# Patient Record
Sex: Female | Born: 1960 | Race: White | Hispanic: No | Marital: Married | Smoking: Never smoker
Health system: Southern US, Community
[De-identification: ages and names within clinical notes are randomized; demographics above are authoritative.]

## PROBLEM LIST (undated history)

## (undated) VITALS — BP 124/80 | HR 93 | Temp 97.5°F | Resp 20 | Ht 64.0 in | Wt 276.0 lb

## (undated) DIAGNOSIS — Z8619 Personal history of other infectious and parasitic diseases: Secondary | ICD-10-CM

## (undated) DIAGNOSIS — Z9989 Dependence on other enabling machines and devices: Secondary | ICD-10-CM

## (undated) DIAGNOSIS — G4733 Obstructive sleep apnea (adult) (pediatric): Secondary | ICD-10-CM

## (undated) DIAGNOSIS — I890 Lymphedema, not elsewhere classified: Secondary | ICD-10-CM

## (undated) DIAGNOSIS — E785 Hyperlipidemia, unspecified: Secondary | ICD-10-CM

## (undated) DIAGNOSIS — M797 Fibromyalgia: Secondary | ICD-10-CM

## (undated) DIAGNOSIS — F329 Major depressive disorder, single episode, unspecified: Secondary | ICD-10-CM

## (undated) DIAGNOSIS — M25519 Pain in unspecified shoulder: Secondary | ICD-10-CM

## (undated) DIAGNOSIS — F32A Depression, unspecified: Secondary | ICD-10-CM

## (undated) DIAGNOSIS — R51 Headache: Secondary | ICD-10-CM

## (undated) DIAGNOSIS — E882 Lipomatosis, not elsewhere classified: Secondary | ICD-10-CM

## (undated) DIAGNOSIS — K219 Gastro-esophageal reflux disease without esophagitis: Secondary | ICD-10-CM

## (undated) DIAGNOSIS — E039 Hypothyroidism, unspecified: Secondary | ICD-10-CM

## (undated) DIAGNOSIS — J302 Other seasonal allergic rhinitis: Secondary | ICD-10-CM

## (undated) DIAGNOSIS — Z6841 Body Mass Index (BMI) 40.0 and over, adult: Secondary | ICD-10-CM

## (undated) DIAGNOSIS — G629 Polyneuropathy, unspecified: Secondary | ICD-10-CM

## (undated) DIAGNOSIS — I10 Essential (primary) hypertension: Secondary | ICD-10-CM

## (undated) DIAGNOSIS — Z9119 Patient's noncompliance with other medical treatment and regimen: Secondary | ICD-10-CM

## (undated) DIAGNOSIS — K279 Peptic ulcer, site unspecified, unspecified as acute or chronic, without hemorrhage or perforation: Secondary | ICD-10-CM

## (undated) DIAGNOSIS — S199XXA Unspecified injury of neck, initial encounter: Secondary | ICD-10-CM

## (undated) HISTORY — DX: Peptic ulcer, site unspecified, unspecified as acute or chronic, without hemorrhage or perforation: K27.9

## (undated) HISTORY — DX: Obstructive sleep apnea (adult) (pediatric): G47.33

## (undated) HISTORY — DX: Body Mass Index (BMI) 40.0 and over, adult: Z684

## (undated) HISTORY — DX: Major depressive disorder, single episode, unspecified: F32.9

## (undated) HISTORY — DX: Fibromyalgia: M79.7

## (undated) HISTORY — DX: Essential (primary) hypertension: I10

## (undated) HISTORY — DX: Hyperlipidemia, unspecified: E78.5

## (undated) HISTORY — DX: Lymphedema, not elsewhere classified: I89.0

## (undated) HISTORY — DX: Lipomatosis, not elsewhere classified: E88.2

## (undated) HISTORY — DX: Polyneuropathy, unspecified: G62.9

## (undated) HISTORY — DX: Patient's noncompliance with other medical treatment and regimen: Z91.19

## (undated) HISTORY — DX: Dependence on other enabling machines and devices: Z99.89

## (undated) HISTORY — DX: Hypothyroidism, unspecified: E03.9

## (undated) HISTORY — DX: Depression, unspecified: F32.A

## (undated) HISTORY — DX: Pain in unspecified shoulder: M25.519

## (undated) HISTORY — DX: Unspecified injury of neck, initial encounter: S19.9XXA

## (undated) HISTORY — DX: Other seasonal allergic rhinitis: J30.2

## (undated) HISTORY — DX: Personal history of other infectious and parasitic diseases: Z86.19

## (undated) HISTORY — PX: UPPER GASTROINTESTINAL ENDOSCOPY: SHX188

## (undated) HISTORY — DX: Morbid (severe) obesity due to excess calories: E66.01

## (undated) HISTORY — PX: COLONOSCOPY: SHX174

## (undated) HISTORY — DX: Headache: R51

---

## 1998-09-02 ENCOUNTER — Emergency Department (HOSPITAL_COMMUNITY): Admission: EM | Admit: 1998-09-02 | Discharge: 1998-09-02 | Payer: Self-pay | Admitting: Emergency Medicine

## 2000-01-21 ENCOUNTER — Other Ambulatory Visit: Admission: RE | Admit: 2000-01-21 | Discharge: 2000-01-22 | Payer: Self-pay | Admitting: Psychiatry

## 2000-06-23 ENCOUNTER — Ambulatory Visit (HOSPITAL_COMMUNITY): Admission: RE | Admit: 2000-06-23 | Discharge: 2000-06-23 | Payer: Self-pay | Admitting: Gastroenterology

## 2000-07-12 ENCOUNTER — Encounter: Payer: Self-pay | Admitting: Gastroenterology

## 2000-07-12 ENCOUNTER — Encounter: Admission: RE | Admit: 2000-07-12 | Discharge: 2000-07-12 | Payer: Self-pay | Admitting: Gastroenterology

## 2000-08-11 ENCOUNTER — Encounter: Admission: RE | Admit: 2000-08-11 | Discharge: 2000-08-11 | Payer: Self-pay | Admitting: Family Medicine

## 2000-08-11 ENCOUNTER — Encounter: Payer: Self-pay | Admitting: Family Medicine

## 2000-08-25 ENCOUNTER — Encounter: Admission: RE | Admit: 2000-08-25 | Discharge: 2000-08-25 | Payer: Self-pay | Admitting: Family Medicine

## 2000-08-25 ENCOUNTER — Encounter: Payer: Self-pay | Admitting: Family Medicine

## 2000-12-28 ENCOUNTER — Encounter: Admission: RE | Admit: 2000-12-28 | Discharge: 2000-12-28 | Payer: Self-pay | Admitting: Family Medicine

## 2000-12-28 ENCOUNTER — Encounter: Payer: Self-pay | Admitting: Family Medicine

## 2001-01-25 ENCOUNTER — Other Ambulatory Visit: Admission: RE | Admit: 2001-01-25 | Discharge: 2001-01-25 | Payer: Self-pay | Admitting: Internal Medicine

## 2001-03-09 ENCOUNTER — Encounter (INDEPENDENT_AMBULATORY_CARE_PROVIDER_SITE_OTHER): Payer: Self-pay | Admitting: *Deleted

## 2001-03-09 ENCOUNTER — Ambulatory Visit (HOSPITAL_COMMUNITY): Admission: RE | Admit: 2001-03-09 | Discharge: 2001-03-09 | Payer: Self-pay | Admitting: Gastroenterology

## 2001-05-06 ENCOUNTER — Encounter: Payer: Self-pay | Admitting: Internal Medicine

## 2001-05-06 ENCOUNTER — Encounter: Admission: RE | Admit: 2001-05-06 | Discharge: 2001-05-06 | Payer: Self-pay | Admitting: Internal Medicine

## 2001-06-20 ENCOUNTER — Encounter: Admission: RE | Admit: 2001-06-20 | Discharge: 2001-06-20 | Payer: Self-pay | Admitting: Internal Medicine

## 2001-06-20 ENCOUNTER — Encounter: Payer: Self-pay | Admitting: Internal Medicine

## 2001-11-23 HISTORY — PX: ABDOMINAL HYSTERECTOMY: SHX81

## 2001-11-23 HISTORY — PX: VAGINAL HYSTERECTOMY: SHX2639

## 2001-12-23 ENCOUNTER — Encounter: Admission: RE | Admit: 2001-12-23 | Discharge: 2002-03-23 | Payer: Self-pay | Admitting: Anesthesiology

## 2002-02-14 ENCOUNTER — Encounter: Payer: Self-pay | Admitting: Emergency Medicine

## 2002-02-14 ENCOUNTER — Emergency Department (HOSPITAL_COMMUNITY): Admission: EM | Admit: 2002-02-14 | Discharge: 2002-02-14 | Payer: Self-pay | Admitting: Emergency Medicine

## 2002-06-22 ENCOUNTER — Other Ambulatory Visit: Admission: RE | Admit: 2002-06-22 | Discharge: 2002-06-22 | Payer: Self-pay | Admitting: Obstetrics and Gynecology

## 2002-06-29 ENCOUNTER — Ambulatory Visit (HOSPITAL_COMMUNITY): Admission: RE | Admit: 2002-06-29 | Discharge: 2002-06-29 | Payer: Self-pay | Admitting: Obstetrics and Gynecology

## 2002-07-23 ENCOUNTER — Emergency Department (HOSPITAL_COMMUNITY): Admission: EM | Admit: 2002-07-23 | Discharge: 2002-07-23 | Payer: Self-pay | Admitting: *Deleted

## 2002-10-18 ENCOUNTER — Encounter (INDEPENDENT_AMBULATORY_CARE_PROVIDER_SITE_OTHER): Payer: Self-pay | Admitting: Specialist

## 2002-10-18 ENCOUNTER — Observation Stay (HOSPITAL_COMMUNITY): Admission: RE | Admit: 2002-10-18 | Discharge: 2002-10-19 | Payer: Self-pay | Admitting: Obstetrics and Gynecology

## 2005-01-13 ENCOUNTER — Encounter: Admission: RE | Admit: 2005-01-13 | Discharge: 2005-01-13 | Payer: Self-pay | Admitting: Cardiology

## 2005-01-15 ENCOUNTER — Ambulatory Visit (HOSPITAL_COMMUNITY): Admission: RE | Admit: 2005-01-15 | Discharge: 2005-01-15 | Payer: Self-pay | Admitting: Cardiology

## 2005-09-23 ENCOUNTER — Encounter: Admission: RE | Admit: 2005-09-23 | Discharge: 2005-09-23 | Payer: Self-pay | Admitting: Internal Medicine

## 2007-11-24 HISTORY — PX: RECTOCELE REPAIR: SHX761

## 2009-05-22 ENCOUNTER — Emergency Department: Payer: Self-pay | Admitting: Emergency Medicine

## 2009-10-30 ENCOUNTER — Ambulatory Visit: Payer: Self-pay | Admitting: Internal Medicine

## 2009-11-23 HISTORY — PX: VAGINA RECONSTRUCTION SURGERY: SHX828

## 2010-10-17 ENCOUNTER — Encounter: Admission: RE | Admit: 2010-10-17 | Discharge: 2010-10-17 | Payer: Self-pay | Admitting: Family Medicine

## 2010-12-13 ENCOUNTER — Encounter: Payer: Self-pay | Admitting: Internal Medicine

## 2011-04-10 NOTE — Op Note (Signed)
NAME:  Melinda Krause, Melinda Krause                    ACCOUNT NO.:  [id number]   MEDICAL RECORD NO.:  [id number]                   PATIENT TYPE:  AMB   LOCATION:  SDC                                  FACILITY:  [facility]   PHYSICIAN:  Crist Fat. Rivard, M.D.              DATE OF BIRTH:  12-18-1960   DATE OF PROCEDURE:  06/29/2002  DATE OF DISCHARGE:                                 OPERATIVE REPORT   PREOPERATIVE DIAGNOSES:  Chronic pelvic pain.   POSTOPERATIVE DIAGNOSES:  Probable adenomyosis, left peritubal cyst, right  ruptured ovarian cyst, corpus luteum, peritoneal window.   ANESTHESIA:  General.   PROCEDURE:  Diagnostic laparoscopy, ablation of left peritubal cyst,  cauterization of right ruptured ovarian cyst, uterosacral nerve ablation.   SURGEON:  Crist Fat. Rivard, M.D.   ESTIMATED BLOOD LOSS:  Minimal.   PROCEDURE:  After being informed of the planned procedure with possible  complications including bleeding, infection, injury to bowels, bladder, or  ureters, need for laparotomy, informed consent was obtained.  The patient  was taken to OR 4, given general anesthesia with endotracheal intubation.  She was prepped and draped in a sterile fashion and placed in a lithotomy  position.  Her bladder was emptied with an in-and-out Foley catheter.  GYN  examination reveals an anteverted uterus normal in shape and size, two  normal adnexa.  Speculum was inserted.  Anterior lip of the cervix was  grasped with a tenaculum forceps and an acorn intrauterine manipulator was  placed.  Subumbilical area was then infiltrated with Marcaine 0.25 and  incised in a semi-elliptical fashion with knife.  This was then bluntly  taken down to the fascia which was grasped with two Kocher forceps and  incised using Mayo scissors.  The peritoneum was entered in a blunt fashion.  No adhesions were felt around the umbilical area.  The Hasson trocar was  then inserted after placement of a Bertha suture of 0  Vicryl to hold the  trocar in place.  A suprapubic 5 mm incision was made in regards of the  previous midline incision after infiltration with Marcaine 0.25 and a 5 mm  trocar was inserted under direct visualization.  A left lower quadrant 5 mm  trocar was also inserted under direct visualization after infiltration with  Marcaine 0.25.   OBSERVATION:  Anterior cul-de-sac is normal other than the anterior  peritoneum close to the insertion of the round ligament showing a 1 cm  window.  Uterus is normal in size and shape, but has a soft consistency and  probe leaves indentation with manipulation all of which is compatible with  adenomyosis.  Both tubes show previous bilateral tubal ligation.  Right  ovary has a ruptured cyst which is actively bleeding, most likely on a  ruptured corpus luteum.  Left ovary is normal but left tube has a peritubal  cyst that has a bluish color to it and measures approximately 1  cm.  Posterior cul-de-sac is completely normal.  The appendix is visualized with  difficulty being retrocecal, but appears normal.  Liver edge is normal.  All  peritoneal surfaces are normal other than previously mentioned window.   We proceed initially with cauterization of the bleeding cyst using bipolar  cauterization.  The bleeding is rapidly stopped.  Hemostasis is adequate.  We then proceed with removal of the left peritubal cyst using cauterization  with bipolar and sharp scissors to excise.  The cyst is then ruptured and  removed via the 5 mm trocar and sent for pathology.  Hemostasis is completed  with cautery and is adequate.  We also proceed with cauterization of the  peritoneal window which is felt more to be secondary to scarring from  previous surgery than an active process of endometriosis.  Finally, we  proceed with a uterosacral nerve ablation, both uterosacral ligaments being  very easily identified with traction via the acorn manipulator.  Both  ureters are also  easily visualized during this procedure.  Both uterosacral  ligaments are then cauterized on a distance of 1.5 cm with bipolar and  sectioned with sharp scissors.  Hemostasis is adequate.  We then proceed  with abundant irrigation with warm saline and note a satisfactory  hemostasis.  The two 5 mm trocars are removed under direct visualization  with no evidence of bleeding.  We then remove the final trocar after  evacuation of pneumoperitoneum and close the fascia of the umbilical  incision using the previously placed 0 Vicryl suture ensuring that no bowel  loop is caught in the suture.  Skin of all three incisions is closed with  subcuticular suture of 3-0 Vicryl as well as Steri-Strips.  Instruments and  sponge count is complete x2.  Estimated blood loss is minimal.  Procedure is  very well tolerated by the patient who is taken to the recovery room in a  well and stable condition.                                               Crist Fat Rivard, M.D.    SAR/MEDQ  D:  06/29/2002  T:  07/01/2002  Job:  [id number]

## 2011-04-10 NOTE — H&P (Signed)
NAME:  Melinda Krause, Melinda Krause                    ACCOUNT NO.:  [id number]   MEDICAL RECORD NO.:  [id number]                   PATIENT TYPE:  AMB   LOCATION:  SDC                                  [facility]ILITY[facility]   PHYSICIAN:  Crist Fat. Rivard, M.D.              DATE OF BIRTH:  [birth year]   DATE OF ADMISSION:  10/18/2002  DATE OF DISCHARGE:                                HISTORY & PHYSICAL   TE OF PROCEDURE:  October 18, 2002.   REASON FOR ADMISSION:  Chronic pelvic pain.   HISTORY OF PRESENT ILLNESS:  The patient is a 50 year old married white  female gravida 3, para 3, aborta 0 followed by me since May 2002 for chronic  pelvic pain.  She reports menstrual cycle every 30 days lasting 5-6 days  with heavy flow the first few days, no change in her menstrual cycle  recently but she reports pelvic pressure with low back pain that is  constant, worse when starting ovulation and gets relief with her period.  The maximum intensity of her pain is 6-7/10 and it is associated with  nausea, decreased with appetite, but no vomiting.  She denies any  breakthrough bleeding, denies any dysmenorrhea or dyspareunia as well as  denies any postcoital bleeding.  This problem has been going on since the  Fall of 2001 and she has had a full GI workup which was negative other than  gastric ulcerations.   A diagnostic laparoscopy was performed June 29, 2002 which revealed a  peritoneal window close to the round ligament, a somewhat increased uterine  size with a consistency compatible with adenomyosis.  A left paratubal cyst  was removed during that procedure.  The remainder of the abdominopelvic  evaluation was negative at that time.   The patient is at this time requesting definitive treatment with  hysterectomy to most likely address her chronic pelvic pain.  She is fully  aware that the etiology of her pain is still not completely cleared and that  this procedure may not bring 100% relief.   PAST  MEDICAL HISTORY:  1. Allergies to DEMEROL.  2. Hypothyroidism.  3. Hypertension.  4. Status post bilateral tubal ligation.  5. Calcified herniated disk at T8.  6. L5-S1 hernia.  7. Peptic ulcers.  8. Two spontaneous vaginal deliveries and one cesarean section.   CURRENT MEDICATIONS:  1. Vioxx.  2. Imodium.  3. Effexor XR 150 mg q.d.  4. Bextra p.r.n.  5. Levothroid 0.025 mg q.d.  6. Atenolol 100 mg q.d.  7. Trazodone 100 mg h.s.   SOCIAL HISTORY:  Married, nonsmoker, has been out on disability due to her  chronic pelvic, abdominal and back pain, is a Designer, jewellery.   REVIEW OF SYSTEMS:  CONSTITUTIONAL: Negative.  HEAD/EYES/EARS/NOSE AND  THROAT: Negative.  LUNGS: Negative.  CARDIOVASCULAR: Negative.  GASTROINTESTINAL: Known for past peptic ulcers.  GENITOURINARY: Negative.  PSYCHIATRIC: Anxiety disorder followed by Dr. Jeanie Sewer.  FAMILY HISTORY:  Positive fibroids in mother and sister.  Mother with breast  cancer at age 29.  Maternal grandmother with breast cancer at age 24.  No  colon cancer.   PHYSICAL EXAMINATION:  VITAL SIGNS:  Current weight is 247 pounds for a  height of 5 feet 4 inches, blood pressure 124/78.  HEAD/EYES/EARS/NOSE AND THROAT:  Negative.  Thyroid not enlarged.  HEART:  Regular rate and rhythm.  CHEST:  Clear.  BREASTS:  No mass, discharge, skin changes, or nipple retraction.  BACK:  No CVA tenderness.  ABDOMEN:  No tenderness, masses, or hepatosplenomegaly.  EXTREMITIES:  Negative.  NEUROLOGICAL:  Within normal limits.  PELVIC:  Normal external genitalia, vagina and cervix.  Uterus is  anteverted, normal in size and shape.  Adnexa are normal.   LABORATORY DATA:  Most recent labs show a normal Pap smear June 22, 2002 as  well as a benign left paratubal cyst removed June 29, 2002.   ASSESSMENT:  Chronic pelvic pain with no improvement despite medical  treatment (has tried Yasmin for 3 months).  The patient has had evaluation  with  neurosurgeon which concluded to minimal disk disease.  The patient  requests to proceed with definitive therapy.   PLAN:  The patient will be admitted to undergo laparoscopy assisted vaginal  hysterectomy.  The procedure has been reviewed with the patient as well as  possible complications including bleeding, infection, injury to bowels,  bladder, or ureters, the need for laparotomy as well as earlier menopause.  Informed consent was obtained.                                               Crist Fat Rivard, M.D.    SAR/MEDQ  D:  10/17/2002  T:  10/17/2002  Job:  [id number]

## 2011-04-10 NOTE — Cardiovascular Report (Signed)
NAMEMarland Kitchen  MARYLYNNE, KEELIN NO.:  [id number]   MEDICAL RECORD NO.:  [id number]          PATIENT TYPE:  OIB   LOCATION:  2899                         FACILITY:  [facility]   PHYSICIAN:  Madaline Savage, M.D.DATE OF BIRTH:  [birth year]   DATE OF PROCEDURE:  01/15/2005  DATE OF DISCHARGE:                              CARDIAC CATHETERIZATION   Dr. Madaline Savage dictating the cardiac catheterization.   REPORT:   PROCEDURES PERFORMED:  1.  Selective coronary angiography by Judkins technique.  2.  Retrograde left heart catheterization.  3.  Left ventricular angiography.   CARDIOLOGIST:  Madaline Savage, M.D.   COMPLICATIONS:  None.   ENTRY SITE:  Right femoral.   DYE USED:  Omnipaque.   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 Dorothyann Peng who has recently had chest pain and also had a  abnormal electrocardiogram showing some nonspecific T-wave abnormalities in  the anteroseptal leads that did raise concern about ischemia.  A Cardiolite  stress test was performed on one 01/07/2005 showing evidence of ischemia to  the anterior wall.  The ejection fraction estimate was 72%.  Today, the  patient presented for outpatient cardiac cath which was accomplished without  complications.   RESULTS:  Pressures:  The left ventricular pressure was 130/90, end-  diastolic pressure 20. Central aortic pressure was 130/85, mean of 105.  No  aortic valve gradient by pullback technique.   ANGIOGRAPHIC RESULTS:  The patient's coronary arteries were all normal.  No  significant lesions were seen throughout.  The left ventricle showed normal  contractility with no wall motion abnormalities and no evidence of mitral  regurgitation.   FINAL DIAGNOSIS:  1.  Recent chest pain with mild nonspecific abnormalities in the ST-T      segments anterior leads.  2.  Angiographic patent coronary arteries.  3.  Normal left ventricular systolic  function.   PLAN:  The patient will be discharged after a 4-hour period of observation  and will follow with the excellent care of Dr. Dorothyann Peng.      WHG/MEDQ  D:  01/15/2005  T:  01/15/2005  Job:  [id number]   cc:   Candyce Churn. Allyne Gee, M.D.  [address]  [address]  [place]  [place] [address]  Fax: [phone]   [facility]

## 2011-04-10 NOTE — Discharge Summary (Signed)
   NAME:  Melinda Krause, Melinda Krause                    ACCOUNT NO.:  [id number]   MEDICAL RECORD NO.:  [id number]                   PATIENT TYPE:   LOCATION:                                       [facility]   PHYSICIAN:  Crist Fat. Rivard, M.D.              DATE OF BIRTH:  Mar 03, 1961   DATE OF ADMISSION:  10/18/2002  DATE OF DISCHARGE:  10/19/2002                                 DISCHARGE SUMMARY   DISCHARGE DIAGNOSES:  1. Chronic pelvic pain.  2. Fibroid uterus.   OPERATION:  On the date of admission patient underwent a laparoscopically  assisted vaginal hysterectomy, tolerating procedure well.  The patient was  found to have a uterus which was upper limits of normal size and boggy with  normal appearing tubes and ovaries bilaterally.   HISTORY OF PRESENT ILLNESS:  The patient is a 50 year old married white  female gravida 3, para 3, aborta 0 who presents for hysterectomy due to  pelvic pain of over one year duration.  Please see patient's dictated  history and physical examination for details.   PHYSICAL EXAMINATION:  VITAL SIGNS:  Blood pressure 124/78, weight 247  pounds, height 5 feet 4 inches tall.  GENERAL:  Within normal limits.  PELVIC:  Normal external genitalia.  Vagina and cervix are normal.  Uterus  is anteverted, normal in size and shape.  Adnexa are normal.   HOSPITAL COURSE:  On the day of admission patient underwent aforementioned  procedure, tolerating it well.  Postoperative hemoglobin was 10.0  (preoperative hemoglobin 14.5).  By postoperative day number one patient had  resumed bowel and bladder function and was deemed ready for discharge home.   DISCHARGE MEDICATIONS:  1. Tylox one to two tablets q.4h. as needed for pain.  2. Phenergan 25 mg one tablet q.6h. as needed for nausea.  3. Iron 325 mg one tablet b.i.d.  4. Stool softeners one tablet daily.   FOLLOW UP:  The patient is scheduled for a two week follow-up examination  with Dr. Dois Davenport Rivard.   DISCHARGE INSTRUCTIONS:  The patient was advised to call for any problems or  concerns, to clean her incision b.i.d. with peroxide and saline.  She was  further advised to avoid driving for two weeks, heavy lifting for four  weeks, and intercourse for six weeks.  Her diet is a normal diet.   FINAL PATHOLOGY:  Uterus, hysterectomy:  Benign cervix with mild chronic  cervicitis, benign secretory phase endometrium, and leiomyoma.    Melinda Krause.                    Crist Fat Rivard, M.D.   EJP/MEDQ  D:  11/14/2002  T:  11/14/2002  Job:  [id number]

## 2011-04-10 NOTE — Op Note (Signed)
NAME:  Melinda Krause, Melinda Krause                    ACCOUNT NO.:  [id number]   MEDICAL RECORD NO.:  [id number]                   PATIENT TYPE:  OBV   LOCATION:  9199                                 [facility]ACILITY[facility]   PHYSICIAN:  Crist Fat. Rivard, M.D.              DATE OF BIRTH:  1961/06/07   DATE OF PROCEDURE:  10/18/2002  DATE OF DISCHARGE:                                 OPERATIVE REPORT   PREOPERATIVE DIAGNOSES:  Chronic pelvic pain with possible adenomyosis.   POSTOPERATIVE DIAGNOSES:  Chronic pelvic pain with possible adenomyosis.   ANESTHESIA:  General with endotracheal intubation.   PROCEDURE:  Laparoscopically assisted vaginal hysterectomy.   SURGEON:  Crist Fat. Rivard, M.D.   ASSISTANTMarquis Lunch. Adline Peals.   ESTIMATED BLOOD LOSS:  100 cc.   PROCEDURE:  After being informed of the planned procedure with possible  complications including bleeding, infection, injury to bowel, bladder, or  ureters, and need for laparotomy as well as nonguarantee of completely  alleviating her pain, informed consent is obtained.  The patient is taken to  OR number four and given general anesthesia with endotracheal intubation.  She is placed in a lithotomy position, prepped and draped in a sterile  fashion and a Foley catheter is inserted in her bladder as well as a Hulka  intrauterine manipulator is in place.  We proceed with infiltration of the  umbilical area using 6 cc of Marcaine 0.25 and we remove the previously  present scar tissue.  Veress needle is inserted but insufflation is very  difficult, not being able to maintain insufflation due to increased  peritoneal pressure.  The Veress needle was then removed and we decided to  proceed with an open laparoscopy.  The fat and subcutaneous tissue is  brought down bluntly to the fascia which is grasped with two Kocher forceps.  It is then incised with Mayo scissors and a pursestring suture of 0 Vicryl  is placed around the fascia.  The  peritoneum is entered bluntly with no  trauma to any organ.  Hasson trocar is inserted and held in place with the  previously placed suture.  We can now proceed with full insufflation of the  peritoneal cavity using CO2 at a maximum pressure of 15 mmHg.  The  laparoscope is then inserted and mounted on video monitor.  Observation:  Anterior cul-de-sac is normal.  Posterior cul-de-sac has remnant of a  peritoneal defect close to the right uterosacral ligament as previously  mentioned in the diagnostic laparoscopy of August 2003.  Both tubes show  signs of tubal ligation.  Both ovaries are normal and the uterus still has  that soft consistency compatible with adenomyosis.  We are unable to  visualize the appendix and we are unable to visualize the liver edge due to  a somewhat prominent omentum and pericolic fat.  A right and left lower  quadrant trocar 5 mm are placed under direct  visualization after  infiltration of 2 cc of Marcaine 0.25.  We then proceed using tripolar with  cauterization of round ligament on both sides in section, cauterization of  utero-ovarian ligament on both sides in section, cauterization of tube on  both sides in section and this is brought down to about 1 cm above the  uterine arteries.  We are able to sharply open the broad ligament using  Metzenbaum scissors and to bluntly dissect the bladder down without  difficulty.  We then irrigate profusely with warm saline and note a  satisfactory hemostasis and move on to proceed with the vaginal tying.  The  cervix is grasped with two Christella Hartigan' forceps, the Hulka manipulator having  been removed.  The vaginal mucosa around the cervix is infiltrated with  lidocaine 1% with epinephrine 15 cc and is then incised in a circumferential  manner with knife.  This allows [place] to bluntly dissect the vaginal mucosa up  and down and to bluntly open the posterior cul-de-sac.  We are also able to  bluntly open the anterior cul-de-sac,  retracting the bladder up.  Both  uterosacral ligaments are then clamped with a Aundria Rud' clamp, sectioned, and  sutured with a transfixed suture of 0 Vicryl kept for future suspension.  Uterine arteries are then clamped on both sides using Aundria Rud' clamps,  sectioned, and sutured with 0 Vicryl and the uterus is removed entirely.  We  note at this moment a bleeding site on the right uterosacral ligament which  requires two figure-of-eight stitches of 0 Vicryl to complete the  hemostasis.  Hemostasis is then felt to be adequate.  We proceed with a  running locked suture of 0 Vicryl from one uterosacral ligament to the other  encompassing the posterior vaginal lip.  We also place a McCall Moskowitz  stitch from the uterosacral ligament, posterior peritoneum, uterosacral  ligament to close the posterior cul-de-sac and prevent future enterocele.  Hemostasis is rechecked and adequate.  We then close the vagina medially  using figure-of-eight stitches of 0 Vicryl.  We return in laparoscopy,  reinsufflate the pneumoperitoneum at maximum pressure of 15 mmHg and  systematically check hemostasis from one ovary to the other.  Hemostasis is  completely adequate.  We irrigate with warm saline profusely, still maintain  an adequate hemostasis.  We are able to visualize both ureters which have a  nice mobility.  The two 5 mm trocars are removed under direct visualization.  Pneumoperitoneum is evacuated and the Hasson trocar is then removed.  The  previously placed pursestring suture on the fascia is closed after verifying  that no bowel loop was present and the fascia is closed.  All incisions are  then closed with Dermabond.   Instrument and sponge count is complete x2.  Estimated blood loss is 100 cc.  The procedure is very well tolerated by the patient who is taken to the  recovery room in a well and stable condition.                                               Crist Fat Rivard, M.D.   SAR/MEDQ   D:  10/18/2002  T:  10/18/2002  Job:  [id number]

## 2011-04-10 NOTE — Procedures (Signed)
[facility]  Patient:    Melinda Krause, Melinda Krause                    MRN: [id number] Proc. Date: 03/09/01 Adm. Date:  [id number] Attending:  Charna Elizabeth CC:         Gloriajean Dell. Andrey Campanile, M.D.                           Procedure Report  DATE OF BIRTH:  REFERRING PHYSICIAN:  Gloriajean Dell. Andrey Campanile, M.D.  PROCEDURE PERFORMED:  Esophagogastroduodenoscopy with biopsies.  ENDOSCOPIST:  Anselmo Rod, M.D.  INSTRUMENT USED:  Olympus video panendoscope.  INDICATIONS FOR PROCEDURE:  Severe epigastric and left upper quadrant pain in a 50 year old white female rule out peptic ulcer disease, esophagitis, gastritis, etc.  PREPROCEDURE PREPARATION:  Informed consent was procured from the patient. The patient was fasted for eight hours prior to the procedure.  PREPROCEDURE PHYSICAL:  The patient had stable vital signs.  Neck supple. Chest clear to auscultation.  S1, S2 regular.  Abdomen soft with epigastric and left upper quadrant tenderness on palpation.  DESCRIPTION OF PROCEDURE:  The patient was placed in left lateral decubitus position and sedated with 100 mcg of fentanyl and 10 mg of Versed intravenously.  Once the patient was adequately sedated and maintained on low-flow oxygen and continuous cardiac monitoring, the Olympus video panendoscope was advanced through the mouthpiece, over the tongue, into the esophagus under direct vision.  The entire esophagus appeared normal without evidence of ring, stricture, masses, lesions, esophagitis or Barretts mucosa. The scope was then advanced to the stomach.  There were multiple midbody and antral ulcers.  There was a larger ulcer measuring about 1 cm in the antrum. Smaller ulcers were seen in the midbody.  Multiple biopsies were done to rule out presence of Helicobacter pylori by pathology.  The proximal small bowel. appeared normal.  The patient tolerated the procedure well without complications.  IMPRESSION: 1.  Multiple gastric ulcers, biopsies done for  Helicobacter pylori. 2. Normal-appearing esophagus and proximal small bowel.  RECOMMENDATION: 1. Nexium started for the patient 40 mg 1 p.o. q.d. 2. Treat with antibiotics if Helicobacter pylori present on biopsies. 3. Avoid all nonsteroidals.  Use Tylenol only as needed for pain. 4. Outpatient follow-up in the next two weeks. DD:  03/09/01 TD:  03/10/01 Job: [id number]

## 2011-07-17 ENCOUNTER — Ambulatory Visit (INDEPENDENT_AMBULATORY_CARE_PROVIDER_SITE_OTHER): Payer: BC Managed Care – PPO | Admitting: Internal Medicine

## 2011-07-17 ENCOUNTER — Encounter: Payer: Self-pay | Admitting: Internal Medicine

## 2011-07-17 VITALS — BP 132/90 | HR 82 | Temp 98.1°F | Resp 20 | Ht 64.0 in | Wt 240.0 lb

## 2011-07-17 DIAGNOSIS — R109 Unspecified abdominal pain: Secondary | ICD-10-CM | POA: Insufficient documentation

## 2011-07-17 DIAGNOSIS — K7689 Other specified diseases of liver: Secondary | ICD-10-CM

## 2011-07-17 DIAGNOSIS — K760 Fatty (change of) liver, not elsewhere classified: Secondary | ICD-10-CM | POA: Insufficient documentation

## 2011-07-17 DIAGNOSIS — G4700 Insomnia, unspecified: Secondary | ICD-10-CM

## 2011-07-17 DIAGNOSIS — G43909 Migraine, unspecified, not intractable, without status migrainosus: Secondary | ICD-10-CM | POA: Insufficient documentation

## 2011-07-17 DIAGNOSIS — R7309 Other abnormal glucose: Secondary | ICD-10-CM

## 2011-07-17 DIAGNOSIS — M797 Fibromyalgia: Secondary | ICD-10-CM

## 2011-07-17 DIAGNOSIS — R739 Hyperglycemia, unspecified: Secondary | ICD-10-CM | POA: Insufficient documentation

## 2011-07-17 DIAGNOSIS — F32A Depression, unspecified: Secondary | ICD-10-CM

## 2011-07-17 DIAGNOSIS — E039 Hypothyroidism, unspecified: Secondary | ICD-10-CM

## 2011-07-17 DIAGNOSIS — R634 Abnormal weight loss: Secondary | ICD-10-CM

## 2011-07-17 DIAGNOSIS — F3289 Other specified depressive episodes: Secondary | ICD-10-CM

## 2011-07-17 DIAGNOSIS — E785 Hyperlipidemia, unspecified: Secondary | ICD-10-CM

## 2011-07-17 DIAGNOSIS — IMO0001 Reserved for inherently not codable concepts without codable children: Secondary | ICD-10-CM

## 2011-07-17 DIAGNOSIS — F329 Major depressive disorder, single episode, unspecified: Secondary | ICD-10-CM

## 2011-07-17 DIAGNOSIS — I10 Essential (primary) hypertension: Secondary | ICD-10-CM

## 2011-07-17 LAB — TSH: TSH: 1.303 u[IU]/mL (ref 0.350–4.500)

## 2011-07-17 LAB — T4, FREE: Free T4: 1.79 ng/dL (ref 0.80–1.80)

## 2011-07-17 MED ORDER — ZOLPIDEM TARTRATE 10 MG PO TABS: 10.0000 mg | ORAL_TABLET | Freq: Every evening | ORAL | Status: DC | PRN

## 2011-07-17 MED ORDER — LEVOTHYROXINE SODIUM 200 MCG PO TABS: 200.0000 ug | ORAL_TABLET | Freq: Every day | ORAL | Status: DC

## 2011-07-17 NOTE — Assessment & Plan Note (Signed)
Continue psychiatry close followup

## 2011-07-17 NOTE — Progress Notes (Signed)
  Subjective:    Patient ID: Melinda Krause, female    DOB: 07-23-1961, 50 y.o.   MRN: [id number]  HPI Pt presents to clinic to establish care and for evaluation of multiple medical problems. Has chronic h/o unintended wt loss with associated intermittent n/v and luq pain. Duration several days. Failed ppi. Recalls recent abd [place] reportedly nl. Being evaluated by gi with egd and colonoscopy recommended. Known past h/o pud. Sees psychiatry for depression but also they have attempted multiple medications for insomnia without success. H/o hypothyroidism pt states has been labile requiring multiple dose changes. Takes medication on empty stomach away from other medications. Concerned for possible cushings and believes has had elevated cortisol in the past. Did see local endocrinologist who performed 24 hour urine cortisol reportedly nl. Has been told in past a1c elevated but does not appear to have met diagnostic criteria for DM.   Reviewed pmh, psh, medications, allergies, soc hx and fam hx.    Review of Systems  Constitutional: Positive for fatigue and unexpected weight change.  Gastrointestinal: Positive for nausea, vomiting, abdominal pain and diarrhea.  Psychiatric/Behavioral: Positive for sleep disturbance and dysphoric mood.  All other systems reviewed and are negative.       Objective:   Physical Exam  Physical Exam  Nursing note and vitals reviewed. Constitutional: Appears well-developed and well-nourished. No distress.  HENT: perrl, eom grossly intact, op clear Head: Normocephalic and atraumatic.  Right Ear: External ear normal. Nl tm and canal Left Ear: External ear normal. Nl tm and canal Eyes: Conjunctivae are normal. No scleral icterus.  Neck: Neck supple. Carotid bruit is not present.  Cardiovascular: Normal rate, regular rhythm and normal heart sounds.  Exam reveals no gallop and no friction rub.   No murmur heard. Pulmonary/Chest: Effort normal and breath sounds  normal. No respiratory distress. He has no wheezes. no rales.  Abd: soft, nd, mild discomfort to palpation left abdomen without rebound, guarding or rigidity. +bs. No masses or organomegaly. Lymphadenopathy:    He has no cervical adenopathy.  Neurological:Alert.  Skin: Skin is warm and dry. Not diaphoretic.  Psychiatric: Has a normal mood and affect.        Assessment & Plan:

## 2011-07-17 NOTE — Assessment & Plan Note (Signed)
Record release for recent labs. Recommend proceeding with egd/colon per gi

## 2011-07-17 NOTE — Assessment & Plan Note (Signed)
Short term ambien prn. Husband to assist in managing medication.

## 2011-07-17 NOTE — Assessment & Plan Note (Signed)
Obtain tsh/free t4. Requests [facility] endocrine consult given concern over possible cushings or other endocrinology disorder.

## 2011-08-20 ENCOUNTER — Ambulatory Visit: Payer: BC Managed Care – PPO | Admitting: Internal Medicine

## 2011-08-25 ENCOUNTER — Ambulatory Visit: Payer: BC Managed Care – PPO | Admitting: Internal Medicine

## 2011-09-04 ENCOUNTER — Telehealth: Payer: Self-pay | Admitting: Internal Medicine

## 2011-09-04 ENCOUNTER — Other Ambulatory Visit: Payer: Self-pay | Admitting: *Deleted

## 2011-09-04 MED ORDER — ZOLPIDEM TARTRATE 10 MG PO TABS: 10.0000 mg | ORAL_TABLET | Freq: Every evening | ORAL | Status: DC | PRN

## 2011-09-04 NOTE — Telephone Encounter (Signed)
Sheria Lang with [facility] [address] called and left voice message requesting refill on generic Ambien.

## 2011-09-04 NOTE — Telephone Encounter (Signed)
30

## 2011-09-04 NOTE — Telephone Encounter (Signed)
Verbal refill on Ambien  Provided to [facility]ist [facility] at [facility]

## 2011-09-06 ENCOUNTER — Emergency Department (HOSPITAL_BASED_OUTPATIENT_CLINIC_OR_DEPARTMENT_OTHER)
Admission: EM | Admit: 2011-09-06 | Discharge: 2011-09-06 | Disposition: A | Discharge status: Home / Self Care | Payer: BC Managed Care – PPO | Attending: Emergency Medicine | Admitting: Emergency Medicine

## 2011-09-06 ENCOUNTER — Encounter (HOSPITAL_BASED_OUTPATIENT_CLINIC_OR_DEPARTMENT_OTHER): Payer: Self-pay | Admitting: *Deleted

## 2011-09-06 ENCOUNTER — Emergency Department (INDEPENDENT_AMBULATORY_CARE_PROVIDER_SITE_OTHER): Payer: BC Managed Care – PPO

## 2011-09-06 DIAGNOSIS — N390 Urinary tract infection, site not specified: Secondary | ICD-10-CM

## 2011-09-06 DIAGNOSIS — R1012 Left upper quadrant pain: Secondary | ICD-10-CM | POA: Insufficient documentation

## 2011-09-06 DIAGNOSIS — I10 Essential (primary) hypertension: Secondary | ICD-10-CM | POA: Insufficient documentation

## 2011-09-06 DIAGNOSIS — R112 Nausea with vomiting, unspecified: Secondary | ICD-10-CM | POA: Insufficient documentation

## 2011-09-06 DIAGNOSIS — R1110 Vomiting, unspecified: Secondary | ICD-10-CM

## 2011-09-06 DIAGNOSIS — R10812 Left upper quadrant abdominal tenderness: Secondary | ICD-10-CM

## 2011-09-06 DIAGNOSIS — E785 Hyperlipidemia, unspecified: Secondary | ICD-10-CM | POA: Insufficient documentation

## 2011-09-06 DIAGNOSIS — E039 Hypothyroidism, unspecified: Secondary | ICD-10-CM | POA: Insufficient documentation

## 2011-09-06 DIAGNOSIS — Z79899 Other long term (current) drug therapy: Secondary | ICD-10-CM | POA: Insufficient documentation

## 2011-09-06 LAB — DIFFERENTIAL
Basophils Absolute: 0 10*3/uL (ref 0.0–0.1)
Basophils Relative: 0 % (ref 0–1)
Eosinophils Absolute: 0.1 10*3/uL (ref 0.0–0.7)
Eosinophils Relative: 1 % (ref 0–5)
Lymphocytes Relative: 17 % (ref 12–46)
Lymphs Abs: 1.5 10*3/uL (ref 0.7–4.0)
Monocytes Absolute: 0.6 10*3/uL (ref 0.1–1.0)
Monocytes Relative: 6 % (ref 3–12)
Neutro Abs: 6.7 10*3/uL (ref 1.7–7.7)
Neutrophils Relative %: 76 % (ref 43–77)

## 2011-09-06 LAB — COMPREHENSIVE METABOLIC PANEL
ALT: 18 U/L (ref 0–35)
AST: 21 U/L (ref 0–37)
Albumin: 4.2 g/dL (ref 3.5–5.2)
Alkaline Phosphatase: 105 U/L (ref 39–117)
BUN: 6 mg/dL (ref 6–23)
CO2: 27 mEq/L (ref 19–32)
Calcium: 9.7 mg/dL (ref 8.4–10.5)
Chloride: 98 mEq/L (ref 96–112)
Creatinine, Ser: 0.8 mg/dL (ref 0.50–1.10)
GFR calc Af Amer: >90 mL/min (ref >90)
GFR calc non Af Amer: 85 mL/min — ABNORMAL LOW (ref >90)
Glucose, Bld: 100 mg/dL — ABNORMAL HIGH (ref 70–99)
Potassium: 3.4 mEq/L — ABNORMAL LOW (ref 3.5–5.1)
Sodium: 136 mEq/L (ref 135–145)
Total Bilirubin: 1 mg/dL (ref 0.3–1.2)
Total Protein: 7.1 g/dL (ref 6.0–8.3)

## 2011-09-06 LAB — URINALYSIS, ROUTINE W REFLEX MICROSCOPIC
Bilirubin Urine: NEGATIVE
Glucose, UA: NEGATIVE mg/dL
Hgb urine dipstick: NEGATIVE
Ketones, ur: 15 mg/dL — AB
Nitrite: NEGATIVE
Protein, ur: NEGATIVE mg/dL
Specific Gravity, Urine: 1.041 — ABNORMAL HIGH (ref 1.005–1.030)
Urobilinogen, UA: 1 mg/dL (ref 0.0–1.0)
pH: 7 (ref 5.0–8.0)

## 2011-09-06 LAB — URINE MICROSCOPIC-ADD ON

## 2011-09-06 LAB — CBC
HCT: 39 % (ref 36.0–46.0)
Hemoglobin: 13.7 g/dL (ref 12.0–15.0)
MCH: 28.1 pg (ref 26.0–34.0)
MCHC: 35.1 g/dL (ref 30.0–36.0)
MCV: 79.9 fL (ref 78.0–100.0)
Platelets: 420 10*3/uL — ABNORMAL HIGH (ref 150–400)
RBC: 4.88 MIL/uL (ref 3.87–5.11)
RDW: 13.9 % (ref 11.5–15.5)
WBC: 8.9 10*3/uL (ref 4.0–10.5)

## 2011-09-06 MED ORDER — DOXYCYCLINE HYCLATE 100 MG PO CAPS: 100.0000 mg | ORAL_CAPSULE | Freq: Two times a day (BID) | ORAL | Status: AC

## 2011-09-06 MED ORDER — PROMETHAZINE HCL 25 MG RE SUPP: 25.0000 mg | Freq: Four times a day (QID) | RECTAL | Status: DC | PRN

## 2011-09-06 MED ORDER — ONDANSETRON HCL 4 MG/2ML IJ SOLN
4.0000 mg | Freq: Once | INTRAMUSCULAR | Status: AC
  Administered 2011-09-06: 4 mg via INTRAVENOUS
  Filled 2011-09-06: qty 2

## 2011-09-06 MED ORDER — MORPHINE SULFATE 4 MG/ML IJ SOLN
4.0000 mg | Freq: Once | INTRAMUSCULAR | Status: AC
  Administered 2011-09-06: 4 mg via INTRAVENOUS
  Filled 2011-09-06: qty 1

## 2011-09-06 MED ORDER — IOHEXOL 300 MG/ML  SOLN
100.0000 mL | Freq: Once | INTRAMUSCULAR | Status: AC | PRN
  Administered 2011-09-06: 100 mL via INTRAVENOUS

## 2011-09-06 MED ORDER — SODIUM CHLORIDE 0.9 % IV BOLUS (SEPSIS)
1000.0000 mL | Freq: Once | INTRAVENOUS | Status: AC
  Administered 2011-09-06: 1000 mL via INTRAVENOUS

## 2011-09-06 MED ORDER — NITROFURANTOIN MONOHYD MACRO 100 MG PO CAPS: 100.0000 mg | ORAL_CAPSULE | Freq: Two times a day (BID) | ORAL | Status: AC

## 2011-09-06 NOTE — ED Notes (Signed)
Pt states she has had N/V for "several months". Had colonoscopy and upper endoscopy on Wed. Unable to keep anything down since. Was given Zofran, but it is not helping.

## 2011-09-06 NOTE — ED Notes (Signed)
Instructed patient to try and provide a urine sample prior to ct for lab

## 2011-09-06 NOTE — ED Provider Notes (Signed)
History     CSN: [id number] Arrival date & time: 09/06/2011  1:48 PM  Chief Complaint  Patient presents with  . Emesis    (Consider location/radiation/quality/duration/timing/severity/associated sxs/prior treatment) HPI Comments: Pt states that she has had a 60 lb wt loss and persistent nausea and vomiting so 5 days ago Dr. Loreta Ave did a colonoscopy and endoscopy and she has had worsening left upper quadrant pain and persistent vomiting and is unable to keep down the zofran that she has  Patient is a 50 y.o. female presenting with vomiting. The history is provided by the patient.  Emesis  This is a recurrent problem. The current episode started more than 1 week ago. The problem occurs 2 to 4 times per day. The problem has not changed since onset.The emesis has an appearance of bilious material. There has been no fever. Associated symptoms include abdominal pain.    Past Medical History  Diagnosis Date  . History of chicken pox     childhood  . Depression     Counseling-Dr [facility]  . Headache     daily  . Peptic ulcer   . Seasonal allergies   . Migraine     1-2 per month  . Hypothyroidism   . Fibromyalgia     Rheumatologist- Dr. Ardis Hughs  . Hypertension   . Hyperlipidemia     Past Surgical History  Procedure Date  . Vaginal hysterectomy 2003  . Cesarean section 1989  . Rectocele repair 2009  . Vagina reconstruction surgery 2011    vaginal mesh repair  . Colonoscopy   . Upper gastrointestinal endoscopy     History reviewed. No pertinent family history.  History  Substance Use Topics  . Smoking status: Never Smoker   . Smokeless tobacco: Never Used  . Alcohol Use: No    OB History    Grav Para Term Preterm Abortions TAB SAB Ect Mult Living                  Review of Systems  Gastrointestinal: Positive for vomiting and abdominal pain.  All other systems reviewed and are negative.    Allergies  Demerol  Home Medications    Current Outpatient Rx  Name Route Sig Dispense Refill  . ACETAMINOPHEN 500 MG PO TABS Oral Take 500 mg by mouth every 6 (six) hours as needed. For pain      . BUPROPION HCL ER (XL) 300 MG PO TB24 Oral Take 300 mg by mouth daily.      . CHOLECALCIFEROL 1000 UNITS PO CAPS Oral Take 2,000 Units by mouth daily.     Marland Kitchen ESOMEPRAZOLE MAGNESIUM 40 MG PO CPDR Oral Take 40 mg by mouth daily before breakfast.      . NEXIUM PO Oral Take by mouth.      Marland Kitchen HYDROCODONE-ACETAMINOPHEN 5-500 MG PO TABS Oral Take 1 tablet by mouth every 6 (six) hours as needed. For pain     . LEVOTHYROXINE SODIUM 200 MCG PO TABS Oral Take 1 tablet (200 mcg total) by mouth daily. 30 tablet 6  . LISINOPRIL-HYDROCHLOROTHIAZIDE 20-12.5 MG PO TABS Oral Take 1 tablet by mouth daily.      Marland Kitchen MAGNESIUM 500 MG PO CAPS Oral Take 500 mg by mouth daily.      Marland Kitchen METOPROLOL SUCCINATE 50 MG PO TB24 Oral Take 50 mg by mouth daily.      Marland Kitchen ONE-DAILY MULTI VITAMINS PO TABS Oral Take 1 tablet by mouth daily.      Marland Kitchen  ZOFRAN ODT PO Oral Take by mouth.      . ONDANSETRON HCL 4 MG PO TABS Oral Take 4 mg by mouth 2 (two) times daily as needed. For nausea     . PROBIOTIC FORMULA PO Oral Take 1 capsule by mouth daily.     Marland Kitchen TRIAZOLAM 0.25 MG PO TABS Oral Take 0.25 mg by mouth daily.      . VENLAFAXINE HCL 150 MG PO CP24 Oral Take 150 mg by mouth daily.      Marland Kitchen ALPRAZOLAM 0.5 MG PO TABS Oral Take 0.5 mg by mouth 3 (three) times daily as needed.      Marland Kitchen NAPROXEN SODIUM 220 MG PO TABS Oral Take 220 mg by mouth 2 (two) times daily with a meal.      . ZOLPIDEM TARTRATE 10 MG PO TABS Oral Take 1 tablet (10 mg total) by mouth at bedtime as needed for sleep. 30 tablet 0    BP 162/96  Pulse 88  Temp(Src) 98.2 F (36.8 C) (Oral)  Resp 20  Ht 5\' 4"  (1.626 m)  Wt 235 lb (106.595 kg)  BMI 40.34 kg/m2  SpO2 96%  Physical Exam  Nursing note and vitals reviewed. Constitutional: She is oriented to person, place, and time. She appears well-developed and  well-nourished.  HENT:  Head: Normocephalic.  Eyes: Pupils are equal, round, and reactive to light.  Neck: Normal range of motion.  Cardiovascular: Normal rate and regular rhythm.   Pulmonary/Chest: Effort normal and breath sounds normal.  Abdominal: Soft.    Musculoskeletal: Normal range of motion.  Neurological: She is alert and oriented to person, place, and time.  Skin: Skin is warm and dry.  Psychiatric: She has a normal mood and affect.    ED Course  Procedures (including critical care time)  Labs Reviewed  URINALYSIS, ROUTINE W REFLEX MICROSCOPIC - Abnormal; Notable for the following:    Appearance CLOUDY (*)    Specific Gravity, Urine 1.041 (*)    Ketones, ur 15 (*)    Leukocytes, UA MODERATE (*)    All other components within normal limits  COMPREHENSIVE METABOLIC PANEL - Abnormal; Notable for the following:    Potassium 3.4 (*)    Glucose, Bld 100 (*)    GFR calc non Af Amer 85 (*)    All other components within normal limits  CBC - Abnormal; Notable for the following:    Platelets 420 (*)    All other components within normal limits  URINE MICROSCOPIC-ADD ON - Abnormal; Notable for the following:    Squamous Epithelial / LPF MANY (*)    Bacteria, UA MANY (*)    Casts HYALINE CASTS (*)    All other components within normal limits  DIFFERENTIAL   Ct Abdomen Pelvis W Contrast  09/06/2011  *RADIOLOGY REPORT*  Clinical Data: Left upper quadrant tenderness and vomiting after colonoscopy and endoscopy 4 days ago. History of hysterectomy, rectocele repair and vaginal reconstruction.  CT ABDOMEN AND PELVIS WITH CONTRAST  Technique:  Multidetector CT imaging of the abdomen and pelvis was performed following the standard protocol during bolus administration of intravenous contrast.  Contrast: OMNIPAQUE IOHEXOL 300 MG/ML IV SOLN  Comparison: Abdominal ultrasound 09/23/2005.  Report from abdominal CT 08/11/2000 - unavailable for comparison.  Findings: The lung bases are  clear.  There is no pleural effusion. The liver, spleen, gallbladder, pancreas, adrenal glands and kidneys appear normal.  No left upper quadrant inflammatory changes are identified.  The stomach and splenic  flexure of the colon appear normal.  There are no enlarged abdominal pelvic lymph nodes.  There is no evidence of pelvic mass or fluid collection status post hysterectomy. Both ovaries appear normal.  Pelvic phleboliths are present bilaterally. The urinary bladder appears unremarkable.  No acute osseous findings are seen.  There are mild degenerative changes in the lower lumbar spine.  IMPRESSION: No acute abdominal pelvic findings demonstrated.  No inflammatory process or bowel obstruction demonstrated.  Original Report Authenticated By: Gerrianne Scale, M.D.     1. UTI (urinary tract infection)   2. Nausea and vomiting       MDM  No obstruction noted:will treat for uti and persistent n/v and pt can follow up with Dr. Vernell Barrier tolerating po here today without any problem        Teressa Lower, NP 09/06/11 1736  Teressa Lower, NP 09/06/11 1737

## 2011-09-07 NOTE — ED Provider Notes (Signed)
Medical screening examination/treatment/procedure(s) were performed by non-physician practitioner and as supervising physician I was immediately available for consultation/collaboration.   Eartha Vonbehren A Charnika Herbst, MD 09/07/11 2350 

## 2011-09-08 ENCOUNTER — Ambulatory Visit: Payer: BC Managed Care – PPO | Admitting: Internal Medicine

## 2011-09-08 DIAGNOSIS — Z0289 Encounter for other administrative examinations: Secondary | ICD-10-CM

## 2011-09-09 ENCOUNTER — Other Ambulatory Visit (HOSPITAL_COMMUNITY): Payer: Self-pay | Admitting: Gastroenterology

## 2011-09-24 ENCOUNTER — Encounter: Payer: Self-pay | Admitting: Internal Medicine

## 2011-09-24 ENCOUNTER — Ambulatory Visit (INDEPENDENT_AMBULATORY_CARE_PROVIDER_SITE_OTHER): Payer: BC Managed Care – PPO | Admitting: Internal Medicine

## 2011-09-24 VITALS — BP 122/90 | HR 79 | Temp 97.8°F | Resp 18 | Ht 64.0 in | Wt 232.0 lb

## 2011-09-24 DIAGNOSIS — R42 Dizziness and giddiness: Secondary | ICD-10-CM

## 2011-09-24 DIAGNOSIS — E876 Hypokalemia: Secondary | ICD-10-CM

## 2011-09-24 LAB — BASIC METABOLIC PANEL
BUN: 8 mg/dL (ref 6–23)
CO2: 26 mEq/L (ref 19–32)
Calcium: 9.8 mg/dL (ref 8.4–10.5)
Chloride: 101 mEq/L (ref 96–112)
Creat: 0.94 mg/dL (ref 0.50–1.10)
Glucose, Bld: 94 mg/dL (ref 70–99)
Potassium: 4.5 mEq/L (ref 3.5–5.3)
Sodium: 140 mEq/L (ref 135–145)

## 2011-09-24 MED ORDER — LISINOPRIL 20 MG PO TABS: 20.0000 mg | ORAL_TABLET | Freq: Every day | ORAL | Status: DC

## 2011-09-26 DIAGNOSIS — E876 Hypokalemia: Secondary | ICD-10-CM | POA: Insufficient documentation

## 2011-09-26 DIAGNOSIS — R42 Dizziness and giddiness: Secondary | ICD-10-CM | POA: Insufficient documentation

## 2011-09-26 NOTE — Progress Notes (Signed)
  Subjective:    Patient ID: Melinda Krause, female    DOB: 05-Jun-1961, 49 y.o.   MRN: [id number]  HPI Pt presents to clinic for evaluation of dizziness.  Notes several day h/o dizziness without vertigo, syncope or neurologic deficits. Occurs with head position change especially bending over. Doesn't occur with postural change. No recent infxn or other associated complaints. Has chronic intermittent n/v with w//u neg to date. Scheduled for hida scan in near future by gi with close follow up next week. Continues with unintended wt loss. Also notes >1 month h/o loose stools without blood. Recent ed visit reviewed with mild hypokalemia.   Past Medical History  Diagnosis Date  . History of chicken pox     childhood  . Depression     Counseling-Dr [facility]  . Headache     daily  . Peptic ulcer   . Seasonal allergies   . Migraine     1-2 per month  . Hypothyroidism   . Fibromyalgia     Rheumatologist- Dr. Ardis Hughs  . Hypertension   . Hyperlipidemia    Past Surgical History  Procedure Date  . Vaginal hysterectomy 2003  . Cesarean section 1989  . Rectocele repair 2009  . Vagina reconstruction surgery 2011    vaginal mesh repair  . Colonoscopy   . Upper gastrointestinal endoscopy     reports that she has never smoked. She has never used smokeless tobacco. She reports that she does not drink alcohol or use illicit drugs. family history is not on file. Allergies  Allergen Reactions  . Demerol     Irritability, and shortness of breath       Review of Systems see hpi     Objective:   Physical Exam  Nursing note and vitals reviewed. Constitutional: She appears well-developed and well-nourished. No distress.  HENT:  Head: Normocephalic and atraumatic.  Right Ear: Tympanic membrane, external ear and ear canal normal. No drainage, swelling or tenderness. No foreign bodies. Tympanic membrane is not injected, not perforated, not erythematous, not  retracted and not bulging. No middle ear effusion.  Left Ear: Tympanic membrane, external ear and ear canal normal. No drainage, swelling or tenderness. No foreign bodies. Tympanic membrane is not injected, not perforated, not erythematous, not retracted and not bulging.  No middle ear effusion.  Neurological: She is alert.  Skin: Skin is warm and dry. She is not diaphoretic.  Psychiatric: She has a normal mood and affect.          Assessment & Plan:

## 2011-09-26 NOTE — Assessment & Plan Note (Signed)
Suspect vestibular etiology. Consider ent consult if sx's persist.

## 2011-09-26 NOTE — Assessment & Plan Note (Signed)
Obtain chem7 

## 2011-10-01 ENCOUNTER — Other Ambulatory Visit (HOSPITAL_COMMUNITY): Payer: BC Managed Care – PPO

## 2011-10-01 ENCOUNTER — Telehealth: Payer: Self-pay | Admitting: *Deleted

## 2011-10-01 MED ORDER — CYCLOBENZAPRINE HCL 5 MG PO TABS: 5.0000 mg | ORAL_TABLET | Freq: Three times a day (TID) | ORAL | Status: DC | PRN

## 2011-10-01 NOTE — Telephone Encounter (Signed)
Patient called and left voice message stating she was seen at [facility] today and was given an injection and had blood work done. She stated she is now having back spasms and would like to know if Dr Rodena Medin would prescribe something for the back spasms and pain she is having.

## 2011-10-01 NOTE — Telephone Encounter (Signed)
Call returned to patient at [phone], she was informed of medication approval to pharmacy per Dr Rodena Medin ok.

## 2011-10-01 NOTE — Telephone Encounter (Signed)
Flexeril 5mg  po tid prn #21

## 2011-10-02 ENCOUNTER — Ambulatory Visit: Payer: BC Managed Care – PPO | Admitting: Internal Medicine

## 2011-10-20 ENCOUNTER — Telehealth: Payer: Self-pay | Admitting: Internal Medicine

## 2011-10-20 MED ORDER — CYCLOBENZAPRINE HCL 5 MG PO TABS: 5.0000 mg | ORAL_TABLET | Freq: Three times a day (TID) | ORAL | Status: DC | PRN

## 2011-10-20 NOTE — Telephone Encounter (Signed)
Patient called and left voice message stating she is still having tremors in her hand, and would like to know if Dr Rodena Medin would refill Flexeril for her. She stated that she is scheduled to see neurology at the end of the week.

## 2011-10-20 NOTE — Telephone Encounter (Signed)
Rx refill sent to pharmacy per Dr Rodena Medin okay.

## 2011-10-20 NOTE — Telephone Encounter (Signed)
Chart opened in error

## 2011-10-20 NOTE — Telephone Encounter (Signed)
Refill-cyclobenzaprine 5mg  tablet. Take one tablet by mouth 3 times a day as needed for muscle spasms. Qty 21 last fill 11.8.12

## 2011-10-20 NOTE — Telephone Encounter (Signed)
ok 

## 2011-10-29 ENCOUNTER — Other Ambulatory Visit: Payer: Self-pay | Admitting: Internal Medicine

## 2011-10-29 NOTE — Telephone Encounter (Signed)
ok 

## 2011-10-29 NOTE — Telephone Encounter (Signed)
Rs refill sent to pharmacy.

## 2011-11-13 ENCOUNTER — Other Ambulatory Visit: Payer: Self-pay | Admitting: Internal Medicine

## 2011-11-13 MED ORDER — TRAMADOL HCL 50 MG PO TABS: 50.0000 mg | ORAL_TABLET | Freq: Two times a day (BID) | ORAL | Status: AC | PRN

## 2011-11-13 NOTE — Telephone Encounter (Signed)
Call placed to patient at [phone], no answer. A voice message was left for patient to return call regarding medication status.

## 2011-11-13 NOTE — Telephone Encounter (Signed)
Patient returned phone call and stated she is having a lot of pain from her Fibromyalgia and states she is unable to take aleve or advil, because those do not help her with the spasms she has associated with the fibromyalgia

## 2011-11-13 NOTE — Telephone Encounter (Signed)
Ultram 50mg  po bid prn pain #30 if not allergic and no interactions

## 2011-11-13 NOTE — Telephone Encounter (Signed)
Rx for Tramadol sent to pharmacy. °

## 2011-11-26 ENCOUNTER — Ambulatory Visit (INDEPENDENT_AMBULATORY_CARE_PROVIDER_SITE_OTHER): Payer: No Typology Code available for payment source | Admitting: Internal Medicine

## 2011-11-26 ENCOUNTER — Encounter: Payer: Self-pay | Admitting: Internal Medicine

## 2011-11-26 DIAGNOSIS — IMO0001 Reserved for inherently not codable concepts without codable children: Secondary | ICD-10-CM

## 2011-11-26 DIAGNOSIS — I10 Essential (primary) hypertension: Secondary | ICD-10-CM

## 2011-11-26 DIAGNOSIS — Z23 Encounter for immunization: Secondary | ICD-10-CM

## 2011-11-26 DIAGNOSIS — N939 Abnormal uterine and vaginal bleeding, unspecified: Secondary | ICD-10-CM

## 2011-11-26 DIAGNOSIS — M797 Fibromyalgia: Secondary | ICD-10-CM

## 2011-11-26 DIAGNOSIS — N898 Other specified noninflammatory disorders of vagina: Secondary | ICD-10-CM

## 2011-11-26 MED ORDER — CYCLOBENZAPRINE HCL 5 MG PO TABS: 5.0000 mg | ORAL_TABLET | Freq: Three times a day (TID) | ORAL | Status: DC | PRN

## 2011-11-26 NOTE — Progress Notes (Signed)
  Subjective:    Patient ID: Melinda Krause, female    DOB: 01/20/1961, 51 y.o.   MRN: [id number]  HPI Pt presents to clinic for followup of multiple medical problems. Continues to see gi with chronic n/v. Taking phenergan and imodium with improved sx's. Since improved has now started to gain wt. GI is discussing possible hida scan. H/o fibromyalgia and stopped ultram and nsaids. Requests rf of flexeril. Needs referral to new gyn/uro to followup intermittent spotting s/p vaginal mesh surgery. Previous gyn/uro at [facility] now out of network. Also attempting to change psychiatrists (within the same practice) due to insurance issues. BP mildly elevated but states normotensive at home.    Past Medical History  Diagnosis Date  . History of chicken pox     childhood  . Depression     Counseling-Dr [facility]  . Headache     daily  . Peptic ulcer   . Seasonal allergies   . Migraine     1-2 per month  . Hypothyroidism   . Fibromyalgia     Rheumatologist- Dr. Ardis Hughs  . Hypertension   . Hyperlipidemia    Past Surgical History  Procedure Date  . Vaginal hysterectomy 2003  . Cesarean section 1989  . Rectocele repair 2009  . Vagina reconstruction surgery 2011    vaginal mesh repair  . Colonoscopy   . Upper gastrointestinal endoscopy     reports that she has never smoked. She has never used smokeless tobacco. She reports that she does not drink alcohol or use illicit drugs. family history is not on file. Allergies  Allergen Reactions  . Demerol     Irritability, and shortness of breath     Review of Systems see hpi     Objective:   Physical Exam  Nursing note and vitals reviewed. Constitutional: She appears well-developed and well-nourished. No distress.  HENT:  Head: Normocephalic and atraumatic.  Right Ear: External ear normal.  Left Ear: External ear normal.  Eyes: Conjunctivae are normal. No scleral icterus.  Cardiovascular: Normal rate,  regular rhythm and normal heart sounds.   Pulmonary/Chest: Effort normal and breath sounds normal.  Skin: She is not diaphoretic.  Psychiatric: She has a normal mood and affect.          Assessment & Plan:

## 2011-11-26 NOTE — Patient Instructions (Signed)
Please schedule chem7, lft (v58.69), cbc(401.9) and tsh/free t4(hypothyroidism) prior to next visit

## 2011-11-28 DIAGNOSIS — N939 Abnormal uterine and vaginal bleeding, unspecified: Secondary | ICD-10-CM | POA: Insufficient documentation

## 2011-11-28 NOTE — Assessment & Plan Note (Signed)
Normotensive on home monitoring. Continue current regimen

## 2011-11-28 NOTE — Assessment & Plan Note (Signed)
- 

## 2011-11-28 NOTE — Assessment & Plan Note (Signed)
Gyn/uro referral

## 2011-12-21 ENCOUNTER — Telehealth: Payer: Self-pay | Admitting: Internal Medicine

## 2011-12-21 NOTE — Telephone Encounter (Signed)
30

## 2011-12-22 MED ORDER — TRAMADOL HCL 50 MG PO TABS: 50.0000 mg | ORAL_TABLET | Freq: Two times a day (BID) | ORAL | Status: DC | PRN

## 2011-12-22 NOTE — Telephone Encounter (Signed)
Rx refill sent to pharmacy per Dr Hodgin okay. 

## 2012-01-11 ENCOUNTER — Other Ambulatory Visit: Payer: Self-pay | Admitting: Internal Medicine

## 2012-01-11 NOTE — Telephone Encounter (Signed)
Rx refill sent to pharmacy per Dr Hodgin ok. 

## 2012-01-12 ENCOUNTER — Other Ambulatory Visit: Payer: Self-pay | Admitting: Internal Medicine

## 2012-01-13 NOTE — Telephone Encounter (Signed)
Rx refill sent to pharmacy. 

## 2012-01-13 NOTE — Telephone Encounter (Signed)
#  30 rf 1 

## 2012-02-15 ENCOUNTER — Ambulatory Visit: Payer: No Typology Code available for payment source | Admitting: Internal Medicine

## 2012-02-22 ENCOUNTER — Other Ambulatory Visit: Payer: Self-pay | Admitting: Internal Medicine

## 2012-02-22 ENCOUNTER — Ambulatory Visit (INDEPENDENT_AMBULATORY_CARE_PROVIDER_SITE_OTHER): Payer: No Typology Code available for payment source | Admitting: Internal Medicine

## 2012-02-22 VITALS — BP 142/84 | HR 123 | Temp 98.2°F | Resp 20 | Wt 290.0 lb

## 2012-02-22 DIAGNOSIS — IMO0002 Reserved for concepts with insufficient information to code with codable children: Secondary | ICD-10-CM

## 2012-02-22 DIAGNOSIS — M6530 Trigger finger, unspecified finger: Secondary | ICD-10-CM

## 2012-02-22 DIAGNOSIS — I10 Essential (primary) hypertension: Secondary | ICD-10-CM

## 2012-02-22 DIAGNOSIS — E039 Hypothyroidism, unspecified: Secondary | ICD-10-CM

## 2012-02-22 DIAGNOSIS — E876 Hypokalemia: Secondary | ICD-10-CM

## 2012-02-22 NOTE — Patient Instructions (Signed)
Please schedule fasting labs for tomorrow morning Tsh, free t4 (hypothyroidism), lipid-chol screening, chem7, lft- v58.69

## 2012-02-23 NOTE — Telephone Encounter (Signed)
Rx refill sent to pharamcy.

## 2012-03-01 ENCOUNTER — Other Ambulatory Visit: Payer: Self-pay | Admitting: Internal Medicine

## 2012-03-02 ENCOUNTER — Encounter: Payer: Self-pay | Admitting: Internal Medicine

## 2012-03-02 DIAGNOSIS — IMO0002 Reserved for concepts with insufficient information to code with codable children: Secondary | ICD-10-CM | POA: Insufficient documentation

## 2012-03-02 NOTE — Assessment & Plan Note (Signed)
Obtain chem7 

## 2012-03-02 NOTE — Progress Notes (Signed)
  Subjective:    Patient ID: Melinda Krause, female    DOB: Dec 25, 1960, 51 y.o.   MRN: [id number]  HPI Pt presents to clinic for followup of multiple medical problems. Wt increased 18 lbs since last visit. Has bilateral middle finger trigger finger. Cannot tolerate nsaids. Has ortho appt tomorrow. bp mildly elevated. Home bp readings 130-140/80's.    Past Medical History  Diagnosis Date  . History of chicken pox     childhood  . Depression     Counseling-Dr [facility]  . Headache     daily  . Peptic ulcer   . Seasonal allergies   . Migraine     1-2 per month  . Hypothyroidism   . Fibromyalgia     Rheumatologist- Dr. Ardis Hughs  . Hypertension   . Hyperlipidemia    Past Surgical History  Procedure Date  . Vaginal hysterectomy 2003  . Cesarean section 1989  . Rectocele repair 2009  . Vagina reconstruction surgery 2011    vaginal mesh repair  . Colonoscopy   . Upper gastrointestinal endoscopy     reports that she has never smoked. She has never used smokeless tobacco. She reports that she does not drink alcohol or use illicit drugs. family history is not on file. Allergies  Allergen Reactions  . Demerol     Irritability, and shortness of breath     Review of Systems see hpi     Objective:   Physical Exam  Physical Exam  Nursing note and vitals reviewed. Constitutional: Appears well-developed and well-nourished. No distress.  HENT:  Head: Normocephalic and atraumatic.  Right Ear: External ear normal.  Left Ear: External ear normal.  Eyes: Conjunctivae are normal. No scleral icterus.  Neck: Neck supple. Carotid bruit is not present.  Cardiovascular: Normal rate, regular rhythm and normal heart sounds.  Exam reveals no gallop and no friction rub.   No murmur heard. Pulmonary/Chest: Effort normal and breath sounds normal. No respiratory distress. He has no wheezes. no rales.  Lymphadenopathy:    He has no cervical adenopathy.    Neurological:Alert.  Skin: Skin is warm and dry. Not diaphoretic.  Psychiatric: Has a normal mood and affect.        Assessment & Plan:

## 2012-03-02 NOTE — Telephone Encounter (Signed)
Rx refill sent to pharmacy. 

## 2012-03-02 NOTE — Assessment & Plan Note (Signed)
Borderline control. Recommend exercise and wt loss

## 2012-03-02 NOTE — Telephone Encounter (Signed)
#  30 rf2 

## 2012-03-02 NOTE — Assessment & Plan Note (Signed)
Obtain tsh and ft4 in the setting of wt gain

## 2012-03-02 NOTE — Assessment & Plan Note (Signed)
Provide ortho referral order

## 2012-03-09 ENCOUNTER — Telehealth: Payer: Self-pay | Admitting: *Deleted

## 2012-03-09 NOTE — Telephone Encounter (Signed)
Call placed to patient at [phone], no answer, no voice mail.

## 2012-03-09 NOTE — Telephone Encounter (Signed)
pls make sure doesn't sound suspicious for dvt. Otherwise for swelling try lasix 20mg  po qam prn swelling #30 no rf

## 2012-03-09 NOTE — Telephone Encounter (Signed)
Patient called and left voice message stating she has had lower extremity  Pitting edema, with no relief from elevation. She would like to know if she could obtain a Rx for swelling. She stated she could not afford another copay for an office visit at this time and would appreciate if Dr Rodena Medin could provide her with something.

## 2012-03-10 NOTE — Telephone Encounter (Signed)
Call placed to patient at [phone], no answer. A detailed voice message was left for patient to return phone call regarding additional information needed for swelling. A message was also left for patient to return phone call to inform if Rx for swelling was still needed.

## 2012-03-14 NOTE — Telephone Encounter (Signed)
No return phone call from patient regarding medication status.

## 2012-03-31 ENCOUNTER — Other Ambulatory Visit: Payer: Self-pay | Admitting: Internal Medicine

## 2012-03-31 NOTE — Telephone Encounter (Signed)
Cyclobenzaprine last filled 03/12/12 # 30. Please advise if ok to give refills and if so, how many?

## 2012-03-31 NOTE — Telephone Encounter (Signed)
Rx refill sent to pharmacy per Dr Hodgin ok. 

## 2012-03-31 NOTE — Telephone Encounter (Signed)
Rx refill sent to pharmacy. 

## 2012-03-31 NOTE — Telephone Encounter (Signed)
#  30 rf2 

## 2012-04-12 ENCOUNTER — Telehealth: Payer: Self-pay | Admitting: Internal Medicine

## 2012-04-12 NOTE — Telephone Encounter (Signed)
If the swelling seems like fluid and no suspicion for clot/infxn (swelling with pain, redness, warmth, chest pain or dyspnea) then ok for lasix 20mg  po qam prn swelling #20

## 2012-04-12 NOTE — Telephone Encounter (Signed)
Patient had to go to [place] because her mother is ill.  The car ride made her legs swell and she will have to be there at the hospital for several more days Could Dr Rod[facility] Medin [facility] [place] [phone]

## 2012-04-12 NOTE — Telephone Encounter (Signed)
Call placed to patient at [phone], no answer. A detailed voice message was left informing patient call was returned and additional information is needed regarding the swelling in her legs.

## 2012-04-14 NOTE — Telephone Encounter (Signed)
No return call from patient regarding medication status for swelling of legs.

## 2012-05-25 ENCOUNTER — Ambulatory Visit: Payer: No Typology Code available for payment source | Admitting: Internal Medicine

## 2012-05-25 DIAGNOSIS — Z0289 Encounter for other administrative examinations: Secondary | ICD-10-CM

## 2012-06-10 ENCOUNTER — Other Ambulatory Visit: Payer: Self-pay | Admitting: Internal Medicine

## 2012-06-13 NOTE — Telephone Encounter (Signed)
Ok to fill. Non urgent f/u appt within the next 4-6 wks

## 2012-06-13 NOTE — Telephone Encounter (Signed)
Left detailed message stating that refill has been sent to pharmacy and that patient needs to schedule an appointment in 4-6 weeks.

## 2012-06-13 NOTE — Telephone Encounter (Signed)
[facility] no refills. Please call pt to arrange f/u as advised below.

## 2012-06-13 NOTE — Telephone Encounter (Signed)
Pt last seen in April and recommended f/u in July that has not been rescheduled.  Please advise re: refill.   Requested Medications     Medication name:  Name from pharmacy:  cyclobenzaprine (FLEXERIL) 5 MG tablet  CYCLOBENZAPRINE 5 MG TABLET   Sig: TAKE 1 TABLET BY MOUTH THREE TIMES A DAY AS NEEDED FOR MUSCLE SPASMS   Dispense: 30 tablet Refills: 2 Start: 06/10/2012  Class: Normal   Requested on: 03/31/2012   Originally ordered on: 10/01/2011  Last refill: 05/16/2012

## 2012-06-17 ENCOUNTER — Ambulatory Visit: Payer: No Typology Code available for payment source | Admitting: Internal Medicine

## 2012-06-17 DIAGNOSIS — Z0289 Encounter for other administrative examinations: Secondary | ICD-10-CM

## 2012-06-27 ENCOUNTER — Ambulatory Visit (INDEPENDENT_AMBULATORY_CARE_PROVIDER_SITE_OTHER): Payer: No Typology Code available for payment source | Admitting: Internal Medicine

## 2012-06-27 ENCOUNTER — Encounter: Payer: Self-pay | Admitting: Internal Medicine

## 2012-06-27 VITALS — BP 156/96 | HR 92 | Temp 98.4°F | Resp 18 | Wt 290.2 lb

## 2012-06-27 DIAGNOSIS — E039 Hypothyroidism, unspecified: Secondary | ICD-10-CM

## 2012-06-27 DIAGNOSIS — M797 Fibromyalgia: Secondary | ICD-10-CM

## 2012-06-27 DIAGNOSIS — IMO0001 Reserved for inherently not codable concepts without codable children: Secondary | ICD-10-CM

## 2012-06-27 DIAGNOSIS — Z79899 Other long term (current) drug therapy: Secondary | ICD-10-CM

## 2012-06-27 DIAGNOSIS — I10 Essential (primary) hypertension: Secondary | ICD-10-CM

## 2012-06-27 MED ORDER — DICLOFENAC POTASSIUM 50 MG PO TABS: 50.0000 mg | ORAL_TABLET | Freq: Two times a day (BID) | ORAL | Status: DC | PRN

## 2012-06-27 MED ORDER — HYDROCHLOROTHIAZIDE 25 MG PO TABS: 25.0000 mg | ORAL_TABLET | Freq: Every day | ORAL | Status: DC

## 2012-06-27 NOTE — Assessment & Plan Note (Signed)
suboptimal control. Add hctz 25mg  po qd. Obtain chem7

## 2012-06-27 NOTE — Assessment & Plan Note (Signed)
Obtain tsh/free t4 

## 2012-06-27 NOTE — Assessment & Plan Note (Signed)
Stop aleve. Attempt diclofenac prn sparing use with food and no other nsaids.

## 2012-06-27 NOTE — Progress Notes (Signed)
  Subjective:    Patient ID: Melinda Krause, female    DOB: 13-Oct-1961, 51 y.o.   MRN: [id number]  HPI Pt presents to clinic for evaluation of leg swelling and elevated bp. Home bp's have been elevated. Has one month h/o bilateral le swelling now improved. States previously took Mudlogger combination. Has diffuse pain and stiffness with h/o fibromyalgia. Takes aleve prn but finds effect to be mild.   Past Medical History  Diagnosis Date  . History of chicken pox     childhood  . Depression     Counseling-Dr [facility]  . Headache     daily  . Peptic ulcer   . Seasonal allergies   . Migraine     1-2 per month  . Hypothyroidism   . Fibromyalgia     Rheumatologist- Dr. Ardis Hughs  . Hypertension   . Hyperlipidemia    Past Surgical History  Procedure Date  . Vaginal hysterectomy 2003  . Cesarean section 1989  . Rectocele repair 2009  . Vagina reconstruction surgery 2011    vaginal mesh repair  . Colonoscopy   . Upper gastrointestinal endoscopy     reports that she has never smoked. She has never used smokeless tobacco. She reports that she does not drink alcohol or use illicit drugs. family history is not on file. Allergies  Allergen Reactions  . Demerol     Irritability, and shortness of breath     Review of Systems see hpi      Objective:   Physical Exam  Nursing note and vitals reviewed. Constitutional: She appears well-developed and well-nourished. No distress.  HENT:  Head: Normocephalic and atraumatic.  Eyes: Conjunctivae are normal. No scleral icterus.  Neurological: She is alert.  Skin: Skin is warm and dry. No rash noted. She is not diaphoretic. No erythema.       Trace bilateral lower extremity swelling. No calf swelling or tenderness. No palpable cords  Psychiatric: She has a normal mood and affect.          Assessment & Plan:

## 2012-06-28 LAB — TSH: TSH: 0.041 u[IU]/mL — ABNORMAL LOW (ref 0.350–4.500)

## 2012-06-28 LAB — BASIC METABOLIC PANEL
BUN: 11 mg/dL (ref 6–23)
CO2: 31 mEq/L (ref 19–32)
Calcium: 10 mg/dL (ref 8.4–10.5)
Chloride: 103 mEq/L (ref 96–112)
Creat: 0.75 mg/dL (ref 0.50–1.10)
Glucose, Bld: 92 mg/dL (ref 70–99)
Potassium: 4.6 mEq/L (ref 3.5–5.3)
Sodium: 139 mEq/L (ref 135–145)

## 2012-06-28 LAB — HEPATIC FUNCTION PANEL
ALT: 54 U/L — ABNORMAL HIGH (ref 0–35)
AST: 78 U/L — ABNORMAL HIGH (ref 0–37)
Albumin: 4.2 g/dL (ref 3.5–5.2)
Alkaline Phosphatase: 96 U/L (ref 39–117)
Bilirubin, Direct: 0.2 mg/dL (ref 0.0–0.3)
Indirect Bilirubin: 0.9 mg/dL (ref 0.0–0.9)
Total Bilirubin: 1.1 mg/dL (ref 0.3–1.2)
Total Protein: 6.6 g/dL (ref 6.0–8.3)

## 2012-06-28 LAB — T4, FREE: Free T4: 1.72 ng/dL (ref 0.80–1.80)

## 2012-06-30 ENCOUNTER — Telehealth: Payer: Self-pay | Admitting: *Deleted

## 2012-06-30 NOTE — Telephone Encounter (Signed)
Pt reports that Dr. Raquel James is leaving the practice on 08.15.13 and she cannot be referred to anyone else in the practice due to her Insurance; pt requesting if you would manage her psychiatric medications [specifically mentioning Triazolam & Alprazolam] until she can find a new Psychiatrist.? Please advise.

## 2012-07-01 NOTE — Telephone Encounter (Signed)
LMOM with contact name & number for patient call back to verify pharmacy & all medications requesting/SLS

## 2012-07-01 NOTE — Telephone Encounter (Signed)
Ok temporarily. Needs new psychiatrist.

## 2012-07-04 ENCOUNTER — Other Ambulatory Visit: Payer: Self-pay | Admitting: Internal Medicine

## 2012-07-04 NOTE — Telephone Encounter (Signed)
Cyclobenzaprine last filled 06/10/12 #30.  Please advise re: additional refills.

## 2012-07-05 ENCOUNTER — Telehealth: Payer: Self-pay | Admitting: *Deleted

## 2012-07-05 DIAGNOSIS — R7989 Other specified abnormal findings of blood chemistry: Secondary | ICD-10-CM

## 2012-07-05 DIAGNOSIS — R945 Abnormal results of liver function studies: Secondary | ICD-10-CM

## 2012-07-05 DIAGNOSIS — E039 Hypothyroidism, unspecified: Secondary | ICD-10-CM

## 2012-07-05 MED ORDER — TRIAZOLAM 0.25 MG PO TABS: 0.2500 mg | ORAL_TABLET | Freq: Every day | ORAL | Status: DC

## 2012-07-05 MED ORDER — LEVOTHYROXINE SODIUM 88 MCG PO TABS: 88.0000 ug | ORAL_TABLET | Freq: Every day | ORAL | Status: DC

## 2012-07-05 MED ORDER — ALPRAZOLAM 0.5 MG PO TABS: 0.5000 mg | ORAL_TABLET | Freq: Three times a day (TID) | ORAL | Status: DC | PRN

## 2012-07-05 MED ORDER — LEVOTHYROXINE SODIUM 100 MCG PO TABS: ORAL_TABLET | ORAL | Status: DC

## 2012-07-05 MED ORDER — DICLOFENAC POTASSIUM 50 MG PO TABS: 50.0000 mg | ORAL_TABLET | Freq: Two times a day (BID) | ORAL | Status: DC | PRN

## 2012-07-05 NOTE — Telephone Encounter (Signed)
Left 2nd message on VM for patient call back to verify pharmacy & all medications requested for temporary management via [facility]/SLS

## 2012-07-05 NOTE — Telephone Encounter (Signed)
Message copied by Regis Bill on Tue Jul 05, 2012  2:12 PM ------      Message from: Edwyna Perfect      Created: Mon Jul 04, 2012  8:36 PM       Liver tests mildly high. Thyroid mildy overactive. Change synthroid from to qd. Recheck tsh/free t4(hypothyroidism) and lft-abn lft in 8wks after change

## 2012-07-05 NOTE — Telephone Encounter (Signed)
INFORMATION TRANSFERRED TO 08.13.13 PHONE NOTE to consolidate request/SLS

## 2012-07-05 NOTE — Telephone Encounter (Signed)
Melinda Krause, [facility] 07/05/2012 9:46 AM Signed  Left 2nd message on VM for patient call back to verify pharmacy & all medications requested for temporary management via [facility]/SLS Fatiha Guzy, CMA 07/01/2012 9:05 AM Signed  LMOM with contact name & number for patient call back to verify pharmacy & all medications requesting/SLS Estill Cotta, MD 07/01/2012 7:56 AM Signed  Ok temporarily. Needs new psychiatrist.  Burnard Leigh, CMA 06/30/2012 11:37 AM Signed  Pt reports that Dr. Raquel James is leaving the practice on 08.15.13 and she cannot be referred to anyone else in the practice due to her Insurance; pt requesting if you would manage her psychiatric medications [specifically mentioning Triazolam & Alprazolam] until she can find a new Psychiatrist.?  Please advise.  CONSOLIDATION OF PHONE NOTES; all new & requested Rx[s] to pharmacy via Print & Fax w/notation to pharmacy of unsuccessful attempts to reach patient via phone with voice mail[s] left on pt's answering machine to return office calls regarding medication refills, lab results and physician instructions [with change to thyroid medication dosage]/SLS   F/U lab orders placed in EMR [8-wks] pending contact with patient/SLS

## 2012-07-06 NOTE — Telephone Encounter (Signed)
Finally was able to reach patient via telephone to go over labs, Rx refills and new dosage on Levothyroxine; will mail copy of labs to GI office, Dr. Loreta Ave at [facility] off [facility].  Patient would also like to get temporary refill mgt on Wellbutrin & Effexor/SLS Please advise.

## 2012-07-06 NOTE — Telephone Encounter (Signed)
Ok 1 month 2 rf

## 2012-07-06 NOTE — Telephone Encounter (Signed)
Spoke w/pharmacy [Ron] and explained Notation on faxed orders & change in pt's levothyroxine [pt p/u 200 mcg Rx [2] days ago]; pharmacy will attempt to reach pt as well & inform her that she needs to contact our office/SLS

## 2012-07-07 MED ORDER — VENLAFAXINE HCL ER 75 MG PO CP24: 75.0000 mg | ORAL_CAPSULE | Freq: Every day | ORAL | Status: DC

## 2012-07-07 MED ORDER — BUPROPION HCL ER (XL) 150 MG PO TB24: 150.0000 mg | ORAL_TABLET | Freq: Every day | ORAL | Status: DC

## 2012-07-07 NOTE — Telephone Encounter (Signed)
Rx Done/SLS 

## 2012-07-11 ENCOUNTER — Other Ambulatory Visit: Payer: Self-pay | Admitting: Gastroenterology

## 2012-07-11 DIAGNOSIS — R110 Nausea: Secondary | ICD-10-CM

## 2012-07-11 DIAGNOSIS — R109 Unspecified abdominal pain: Secondary | ICD-10-CM

## 2012-07-19 ENCOUNTER — Encounter (HOSPITAL_COMMUNITY)
Admission: RE | Admit: 2012-07-19 | Discharge: 2012-07-19 | Disposition: A | Discharge status: Home / Self Care | Payer: No Typology Code available for payment source | Source: Ambulatory Visit | Attending: Gastroenterology | Admitting: Gastroenterology

## 2012-07-19 DIAGNOSIS — R110 Nausea: Secondary | ICD-10-CM | POA: Insufficient documentation

## 2012-07-19 DIAGNOSIS — R109 Unspecified abdominal pain: Secondary | ICD-10-CM | POA: Insufficient documentation

## 2012-08-22 ENCOUNTER — Other Ambulatory Visit: Payer: Self-pay | Admitting: Internal Medicine

## 2012-08-23 NOTE — Telephone Encounter (Signed)
#  30 rf2 

## 2012-08-23 NOTE — Telephone Encounter (Signed)
Please advise re: tramadol refills. Rx last filled on 07/12/12. Pt has follow up on 08/25/12.

## 2012-08-24 NOTE — Telephone Encounter (Signed)
Refill left on pharmacy voicemail. 

## 2012-08-25 ENCOUNTER — Ambulatory Visit (INDEPENDENT_AMBULATORY_CARE_PROVIDER_SITE_OTHER): Payer: No Typology Code available for payment source | Admitting: Internal Medicine

## 2012-08-25 ENCOUNTER — Encounter: Payer: Self-pay | Admitting: Internal Medicine

## 2012-08-25 VITALS — BP 177/90 | HR 106 | Resp 16 | Ht 64.0 in | Wt 303.0 lb

## 2012-08-25 DIAGNOSIS — Z23 Encounter for immunization: Secondary | ICD-10-CM

## 2012-08-25 DIAGNOSIS — M25569 Pain in unspecified knee: Secondary | ICD-10-CM

## 2012-08-25 DIAGNOSIS — I10 Essential (primary) hypertension: Secondary | ICD-10-CM

## 2012-08-25 MED ORDER — AMLODIPINE BESYLATE 5 MG PO TABS: 5.0000 mg | ORAL_TABLET | Freq: Every day | ORAL | Status: DC

## 2012-08-28 DIAGNOSIS — M25569 Pain in unspecified knee: Secondary | ICD-10-CM | POA: Insufficient documentation

## 2012-08-28 NOTE — Progress Notes (Signed)
  Subjective:    Patient ID: Melinda Krause, female    DOB: June 06, 1961, 51 y.o.   MRN: [id number]  HPI Pt presents to clinic for followup of multiple medical problems. Meds recent intermittent headaches which seem to coincide with elevated blood pressure. No neurologic deficits. Also complains of left knee pain over the past 1-1/2 weeks. States outside physicians stopped Voltaren because of increased liver function tests. Wishes to pursue aquatic therapy.  Past Medical History  Diagnosis Date  . History of chicken pox     childhood  . Depression     Counseling-Dr [facility]  . Headache     daily  . Peptic ulcer   . Seasonal allergies   . Migraine     1-2 per month  . Hypothyroidism   . Fibromyalgia     Rheumatologist- Dr. Ardis Hughs  . Hypertension   . Hyperlipidemia    Past Surgical History  Procedure Date  . Vaginal hysterectomy 2003  . Cesarean section 1989  . Rectocele repair 2009  . Vagina reconstruction surgery 2011    vaginal mesh repair  . Colonoscopy   . Upper gastrointestinal endoscopy     reports that she has never smoked. She has never used smokeless tobacco. She reports that she does not drink alcohol or use illicit drugs. family history is not on file. Allergies  Allergen Reactions  . Demerol     Irritability, and shortness of breath      Review of Systems see history of present illness    Objective:   Physical Exam  Nursing note and vitals reviewed. Constitutional: She appears well-developed and well-nourished. No distress.  HENT:  Head: Normocephalic and atraumatic.  Right Ear: External ear normal.  Left Ear: External ear normal.  Eyes: Conjunctivae normal are normal. No scleral icterus.  Neck: Neck supple.  Cardiovascular: Normal rate, regular rhythm and normal heart sounds.  Exam reveals no gallop and no friction rub.   No murmur heard. Pulmonary/Chest: Effort normal and breath sounds normal. No respiratory  distress. She has no wheezes. She has no rales.  Neurological: She is alert.  Skin: She is not diaphoretic.  Psychiatric: She has a normal mood and affect.          Assessment & Plan:

## 2012-08-28 NOTE — Assessment & Plan Note (Signed)
Suboptimal control. Add Norvasc 5 mg daily. Monitor blood pressure as an outpatient and call results to clinic within one to two weeks

## 2012-08-28 NOTE — Assessment & Plan Note (Signed)
Avoiding po nsaids. Attempt Voltaren gel (has at home). Schedule aquatic therapy

## 2012-09-08 ENCOUNTER — Ambulatory Visit: Payer: No Typology Code available for payment source | Attending: Internal Medicine

## 2012-09-15 ENCOUNTER — Other Ambulatory Visit: Payer: Self-pay | Admitting: Internal Medicine

## 2012-09-15 ENCOUNTER — Ambulatory Visit: Payer: No Typology Code available for payment source | Admitting: Internal Medicine

## 2012-09-16 NOTE — Telephone Encounter (Signed)
Medication refill request: Flexeril 5 mg tablet po 3 times a day as needed for muscle spasms. #30, 3 rf. Last office visit 08/25/12. No shot to rehab on 09/08/12. Canceled appt on 09/15/12. Please advise.

## 2012-09-16 NOTE — Telephone Encounter (Signed)
#  30 rf3 

## 2012-09-27 ENCOUNTER — Other Ambulatory Visit: Payer: Self-pay | Admitting: *Deleted

## 2012-09-27 MED ORDER — METOPROLOL SUCCINATE ER 50 MG PO TB24: 50.0000 mg | ORAL_TABLET | Freq: Every day | ORAL | Status: DC

## 2012-09-27 NOTE — Telephone Encounter (Signed)
Patient informed/SLS  

## 2012-09-28 ENCOUNTER — Other Ambulatory Visit: Payer: Self-pay | Admitting: *Deleted

## 2012-09-28 MED ORDER — HYDROCODONE-ACETAMINOPHEN 5-325 MG PO TABS: 1.0000 | ORAL_TABLET | Freq: Three times a day (TID) | ORAL | Status: DC | PRN

## 2012-09-28 NOTE — Telephone Encounter (Signed)
Staff Message: [facility] Ok for vicodin 5mg  po tid prn pain #30 no rf     Rx to pharmacy; [facility] with contact name & number to inform pt requested Rx phoned to pharmacy/SLS

## 2012-10-04 ENCOUNTER — Telehealth: Payer: Self-pay | Admitting: *Deleted

## 2012-10-04 NOTE — Telephone Encounter (Signed)
Lasix 20mg  po qam prn swelling #30

## 2012-10-04 NOTE — Telephone Encounter (Signed)
Pt reports that she made driving trip to [place] to visit Mother and c/o Edema problem in legs [continued from last OV]; states that every time she either drives a long distance and/or sits for a long period of time [even with elevation] that the HCTZ is subtherapeutic. Pt would like to know if you advise follow-up OV or change/increase in medication/SLS Please advise.

## 2012-10-05 MED ORDER — FUROSEMIDE 20 MG PO TABS: 20.0000 mg | ORAL_TABLET | Freq: Every morning | ORAL | Status: DC

## 2012-10-05 NOTE — Telephone Encounter (Signed)
Patient informed; Rx to pharmacy/SLS

## 2012-10-05 NOTE — Telephone Encounter (Signed)
Patient called back regarding this. She would like a callback at [phone]. [facility] [address].

## 2012-10-10 ENCOUNTER — Other Ambulatory Visit: Payer: Self-pay | Admitting: Internal Medicine

## 2012-10-10 ENCOUNTER — Ambulatory Visit: Payer: No Typology Code available for payment source | Admitting: Internal Medicine

## 2012-10-10 NOTE — Telephone Encounter (Signed)
Using too much. For her to be out would have to be taking it almost 3 times a day every day.

## 2012-10-10 NOTE — Telephone Encounter (Signed)
Hydrocodone-APAP request last filled 11.06.13 #30x0--spoke w/patient on Friday RE: back, neck & shoulder pain; scheduled OV for today [11.18.13 at 3:00pm], pt canceled appointment this morning & did not rescheduled Acute visit until next Mon, 11.25.13/SLS Please advise.

## 2012-10-14 ENCOUNTER — Telehealth: Payer: Self-pay | Admitting: *Deleted

## 2012-10-14 MED ORDER — HYDROCODONE-ACETAMINOPHEN 5-325 MG PO TABS: 1.0000 | ORAL_TABLET | Freq: Three times a day (TID) | ORAL | Status: DC | PRN

## 2012-10-14 NOTE — Telephone Encounter (Signed)
Pt requesting refill for Vicodin to get her through until her OV appt on Monday, stating that she is out of medication [Last Rx 11.06.13 #30x0] & that her she is in pain w/her back, neck and shoulders. [Pt canceled last two appts 10.24.13 & 11.18.13]/SLS Please advise.

## 2012-10-14 NOTE — Telephone Encounter (Signed)
Ok  #30 

## 2012-10-14 NOTE — Telephone Encounter (Signed)
Rx phoned to pharmacy; [facility] to inform patient/SLS

## 2012-10-17 ENCOUNTER — Ambulatory Visit (INDEPENDENT_AMBULATORY_CARE_PROVIDER_SITE_OTHER): Payer: No Typology Code available for payment source | Admitting: Internal Medicine

## 2012-10-17 ENCOUNTER — Encounter: Payer: Self-pay | Admitting: Internal Medicine

## 2012-10-17 VITALS — BP 158/94 | HR 86 | Temp 98.2°F | Resp 18 | Wt 300.5 lb

## 2012-10-17 DIAGNOSIS — I10 Essential (primary) hypertension: Secondary | ICD-10-CM

## 2012-10-17 DIAGNOSIS — M542 Cervicalgia: Secondary | ICD-10-CM

## 2012-10-17 MED ORDER — METHYLPREDNISOLONE 4 MG PO KIT: PACK | ORAL | Status: DC

## 2012-10-17 MED ORDER — LISINOPRIL 40 MG PO TABS: 40.0000 mg | ORAL_TABLET | Freq: Every day | ORAL | Status: DC

## 2012-10-17 NOTE — Progress Notes (Signed)
  Subjective:    Patient ID: Melinda Krause, female    DOB: March 09, 1961, 51 y.o.   MRN: [id number]  HPI patient presents to clinic for evaluation of neck pain. Has chronic intermittent neck pain now radiating to bilateral trapezius muscles. As previously noted numbness of right hand but this has not been a recent occurrence. No focal weakness of either arm or hand. Has done physical therapy in the past but not recently. Flexeril as not providing much improvement. Ultram does not help so is taking hydrocodone intermittently. States previously attempted Lyrica and Neurontin and developed headaches. Home blood pressure monitoring reveals systolics between 140 and 150s.  Past Medical History  Diagnosis Date  . History of chicken pox     childhood  . Depression     Counseling-Dr [facility]  . Headache     daily  . Peptic ulcer   . Seasonal allergies   . Migraine     1-2 per month  . Hypothyroidism   . Fibromyalgia     Rheumatologist- Dr. Ardis Hughs  . Hypertension   . Hyperlipidemia    Past Surgical History  Procedure Date  . Vaginal hysterectomy 2003  . Cesarean section 1989  . Rectocele repair 2009  . Vagina reconstruction surgery 2011    vaginal mesh repair  . Colonoscopy   . Upper gastrointestinal endoscopy     reports that she has never smoked. She has never used smokeless tobacco. She reports that she does not drink alcohol or use illicit drugs. family history is not on file. Allergies  Allergen Reactions  . Demerol     Irritability, and shortness of breath    Review of Systems see hpi     Objective:   Physical Exam  Nursing note and vitals reviewed. Constitutional: She appears well-developed and well-nourished. No distress.  HENT:  Head: Normocephalic and atraumatic.  Eyes: Conjunctivae normal are normal. No scleral icterus.  Musculoskeletal:       Mild tenderness to palpation bilateral posterior lateral neck to trapezius. No bony  abnormality. Full range of motion bilateral upper chimneys. Distal hand strength including MCP's and intertriginous muscles 5/5.  Neurological: She is alert.  Skin: She is not diaphoretic.  Psychiatric: She has a normal mood and affect.          Assessment & Plan:

## 2012-10-17 NOTE — Assessment & Plan Note (Signed)
Neurologically nonfocal. States wished to avoid physical therapy currently due to the cost. Previously intolerant of Neurontin and Lyrica. Attempted Medrol Dosepak. Discussed possible TENS unit

## 2012-10-17 NOTE — Assessment & Plan Note (Signed)
Suboptimal control. Increase lisinopril 40 mg daily. Monitor blood pressure as an outpatient.

## 2012-10-26 ENCOUNTER — Other Ambulatory Visit: Payer: Self-pay | Admitting: Internal Medicine

## 2012-10-26 NOTE — Telephone Encounter (Signed)
Last Rx 11.22.13 #30x0/SLS Please advise

## 2012-10-26 NOTE — Telephone Encounter (Signed)
Has only been 11-12 days since last fill. Needs to slow down use of narcotic. rf #30 no rf.use sparingly

## 2012-10-27 NOTE — Telephone Encounter (Signed)
Rx phoned to pharmacy with change added in dosing instructions/SLS: New Dosing Instructions on Order; please advise patient to take note of provider recommendation to use medication sparingly.  Dispense Quantity: 30 tablet Refills: 0 Fills Remaining: 0 Sig: TAKE [1] ONE TABLET BY MOUTH UP TO [3] TIMES DAILY AS NEEDED FOR PAIN. USE SPARINGLY.

## 2012-11-09 ENCOUNTER — Other Ambulatory Visit: Payer: Self-pay | Admitting: Internal Medicine

## 2012-11-09 NOTE — Telephone Encounter (Signed)
Rx to pharmacy/SLS 

## 2012-11-10 ENCOUNTER — Other Ambulatory Visit: Payer: Self-pay | Admitting: Internal Medicine

## 2012-11-23 HISTORY — PX: OTHER SURGICAL HISTORY: SHX169

## 2012-12-07 ENCOUNTER — Other Ambulatory Visit: Payer: Self-pay | Admitting: Internal Medicine

## 2012-12-08 ENCOUNTER — Telehealth: Payer: Self-pay | Admitting: Internal Medicine

## 2012-12-08 ENCOUNTER — Other Ambulatory Visit: Payer: Self-pay | Admitting: Internal Medicine

## 2012-12-08 DIAGNOSIS — R0681 Apnea, not elsewhere classified: Secondary | ICD-10-CM

## 2012-12-08 NOTE — Telephone Encounter (Signed)
Ok to refill same # and rf #1 for hydrocodone and #3 for muscle relaxer

## 2012-12-08 NOTE — Telephone Encounter (Signed)
Patient states that she would like a referral for sleep apnea.

## 2012-12-08 NOTE — Telephone Encounter (Signed)
Rx[s] phoned to pharmacy/SLS 

## 2012-12-08 NOTE — Telephone Encounter (Signed)
pulm consult order placed

## 2012-12-27 ENCOUNTER — Encounter: Payer: Self-pay | Admitting: Pulmonary Disease

## 2012-12-27 ENCOUNTER — Ambulatory Visit (INDEPENDENT_AMBULATORY_CARE_PROVIDER_SITE_OTHER): Payer: BC Managed Care – PPO | Admitting: Pulmonary Disease

## 2012-12-27 VITALS — BP 154/80 | HR 88 | Temp 97.3°F | Ht 65.0 in | Wt 306.6 lb

## 2012-12-27 DIAGNOSIS — G4733 Obstructive sleep apnea (adult) (pediatric): Secondary | ICD-10-CM | POA: Insufficient documentation

## 2012-12-27 NOTE — Progress Notes (Signed)
Subjective:    Patient ID: Melinda Krause, female    DOB: 1961-11-11, 52 y.o.   MRN: [id number]  HPI The patient is a 52 year old female who been asked to see for management of obstructive sleep apnea.  She tells me that she was diagnosed with moderate to severe sleep apnea in 2010, and was tried on CPAP with poor tolerance.  She never found a mask that fit her well, had issues with mouth dryness, and ultimately could not get to sleep with the device.  All of this is complicated by chronic issues with insomnia and chronic pain.  She is currently seeing a psychiatrist to help with all of this.  The patient has significant snoring, and sleeps in a different bedroom from her spouse.  She has frequent awakenings at night, and does not feel rested when she arises.  Despite this, she does not feel significant daytime sleepiness, and her Epworth score today is zero.  I've explained to her this is very common in insomnia.  The patient states that she has gained significant weight over the last few years.  Sleep Questionnaire: What time do you typically go to bed?( Between what hours) 11p-3am....pt states that if she is not asleep by 3am she will stay awake until the next night. States the longest she has gone without sleep is 4 days. Rxd medications to hlp her sleep but her body seems to be fighting them. How long does it take you to fall asleep? up to 1 hour How many times during the night do you wake up? 5 What time do you get out of bed to start your day? 1100 Do you drive or operate heavy machinery in your occupation? No How much has your weight changed (up or down) over the past two years? (In pounds) 80 lb (36.288 kg) Have you ever had a sleep study before? Yes If yes, location of study? [facility] If yes, date of study? 2010 Do you currently use CPAP? No Do you wear oxygen at any time? No     Review of Systems  Constitutional: Negative for fever and unexpected weight change.  HENT:  Positive for congestion. Negative for ear pain, nosebleeds, sore throat, rhinorrhea, sneezing, trouble swallowing, dental problem, postnasal drip and sinus pressure.        Teeth grinding during sleep  Eyes: Negative for redness and itching.  Respiratory: Positive for shortness of breath. Negative for cough, chest tightness and wheezing.   Cardiovascular: Negative for palpitations and leg swelling.  Gastrointestinal: Positive for nausea and vomiting.  Genitourinary: Negative for dysuria.  Musculoskeletal: Positive for myalgias ( severe during sleep---causing difficulty fiunctioning at night). Negative for joint swelling.  Skin: Negative for rash.  Neurological: Positive for dizziness and headaches.  Hematological: Does not bruise/bleed easily.  Psychiatric/Behavioral: Positive for dysphoric mood. The patient is nervous/anxious.        Objective:   Physical Exam Constitutional:  Obese female, no acute distress  HENT:  Nares patent without discharge, but narrowed bilat  Oropharynx without exudate, palate and uvula are elongated.  Eyes:  Perrla, eomi, no scleral icterus  Neck:  No JVD, no TMG  Cardiovascular:  Normal rate, regular rhythm, no rubs or gallops.  No murmurs        Intact distal pulses  Pulmonary :  Normal breath sounds, no stridor or respiratory distress   No rales, rhonchi, or wheezing  Abdominal:  Soft, nondistended, bowel sounds present.  No tenderness noted.   Musculoskeletal:  mild lower extremity edema noted.  Lymph Nodes:  No cervical lymphadenopathy noted  Skin:  No cyanosis noted  Neurologic:  Alert, appropriate, moves all 4 extremities without obvious deficit.         Assessment & Plan:

## 2012-12-27 NOTE — Assessment & Plan Note (Signed)
The patient tells me that she has a history of moderate to severe obstructive sleep apnea.  She has had difficulties with CPAP tolerance in the past, primarily related to mask fit and mouth dryness.  She is willing to try CPAP again, and I think that I would start with a full facemask.  I would also like to try her on an automatic setting rather than a fixed pressure to see if this will be more comfortable for her.  It may be difficult to overcome her insomnia with the CPAP device, and if so, I have highly recommended that she discuss with her psychiatrist participating in cognitive behavioral therapy.  I've also encouraged her to work aggressively on weight loss.

## 2012-12-27 NOTE — Patient Instructions (Addendum)
Will get you started again on cpap as a trial.  Will use full face mask, and set machine on automatic mode to adjust to your sleep during the night.  Please call if having tolerance issues. Work on [employer] loss Discuss with your psychiatrist cognitive behavioral therapy (CBT). followup with me in 6 weeks.

## 2013-01-30 ENCOUNTER — Other Ambulatory Visit: Payer: Self-pay | Admitting: Internal Medicine

## 2013-01-31 NOTE — Telephone Encounter (Signed)
Pt last seen 10/17/12, Rx last filled on 01/04/13 #30. Please advise.   Medication name:  Name from pharmacy:  HYDROcodone-acetaminophen (NORCO/VICODIN) 5-325 MG per tablet  HYDROCODON-ACETAMINOPHEN 5-325 Sig: TAKE 1 TABLET BY MOUTH 3 TIMES A DAY AS NEEDED FOR PAIN  USE SPARINGLY Dispense: 30 tablet Refills: 1 Start: 01/30/2013 Class: Normal Requested on: 12/08/2012 Originally ordered on: 09/28/2012 Last refill: 01/04/2013

## 2013-01-31 NOTE — Telephone Encounter (Signed)
Can send 15 tabs only.  Pt needs OV please.

## 2013-02-01 NOTE — Telephone Encounter (Signed)
[facility]. Left detailed message on pt's cell# to call and arrange appt.

## 2013-02-10 ENCOUNTER — Ambulatory Visit: Payer: BC Managed Care – PPO | Admitting: Family

## 2013-02-14 ENCOUNTER — Ambulatory Visit (INDEPENDENT_AMBULATORY_CARE_PROVIDER_SITE_OTHER): Payer: BC Managed Care – PPO | Admitting: Family

## 2013-02-14 ENCOUNTER — Encounter: Payer: Self-pay | Admitting: Family

## 2013-02-14 VITALS — BP 160/90 | HR 103 | Temp 98.6°F | Resp 18 | Ht 64.0 in | Wt 305.1 lb

## 2013-02-14 DIAGNOSIS — E039 Hypothyroidism, unspecified: Secondary | ICD-10-CM

## 2013-02-14 DIAGNOSIS — G4733 Obstructive sleep apnea (adult) (pediatric): Secondary | ICD-10-CM

## 2013-02-14 DIAGNOSIS — R51 Headache: Secondary | ICD-10-CM

## 2013-02-14 DIAGNOSIS — F32A Depression, unspecified: Secondary | ICD-10-CM

## 2013-02-14 DIAGNOSIS — F329 Major depressive disorder, single episode, unspecified: Secondary | ICD-10-CM

## 2013-02-14 DIAGNOSIS — F3289 Other specified depressive episodes: Secondary | ICD-10-CM

## 2013-02-14 DIAGNOSIS — R252 Cramp and spasm: Secondary | ICD-10-CM

## 2013-02-14 DIAGNOSIS — I10 Essential (primary) hypertension: Secondary | ICD-10-CM

## 2013-02-14 DIAGNOSIS — R519 Headache, unspecified: Secondary | ICD-10-CM | POA: Insufficient documentation

## 2013-02-14 MED ORDER — AMLODIPINE BESYLATE 10 MG PO TABS: 10.0000 mg | ORAL_TABLET | Freq: Every day | ORAL | Status: DC

## 2013-02-14 NOTE — Patient Instructions (Addendum)
Please complete lab work prior to leaving. Start CPAP as soon as possible. Increase amlodipine from 5mg  to 10 mg. Follow up in 1 month.

## 2013-02-14 NOTE — Assessment & Plan Note (Signed)
Deteriorated.  Increase amlodipine from 5mg  to 10 mg.

## 2013-02-14 NOTE — Assessment & Plan Note (Signed)
Recommended that she work to get CPAP equipment ASAP.  This is likely cause for muscle cramping.  Check electrolytes.

## 2013-02-14 NOTE — Assessment & Plan Note (Signed)
I suspect that untreated sleep apnea is major contributing factor to many of her somatic symptoms including HA.

## 2013-02-14 NOTE — Progress Notes (Signed)
Subjective:    Patient ID: Melinda Krause, female    DOB: 04-20-1961, 52 y.o.   MRN: [id number]  HPI  Melinda Krause is a 52 yr old female who presents today with multiple somatic complaints.   Headache- has been waking up with severe headaches. Has hx of OSA but has not had the up front cash to obtain the the equipment.  Waking up with horrible headaches, wakes up with muscles contracted. She is also treated for fibromyalgia by Dr. Corliss Skains.  Has been using flexeril prn. Intolerant to tramadol due to dry mouth.  She also reports some left shoulder pain.  She enjoys swimming.  Uses vicodin for the joint pain.  Due to hx of ulcers she avoids NSAIDS.    HTN- reports that her BP at home 150-160's over 70-80.   Depression- she is followed by psychiatry- sees Melinda Krause at [facility].   Recently started on fetzima.   Review of Systems See HPI  Past Medical History  Diagnosis Date  . History of chicken pox     childhood  . Depression     Counseling-Dr [facility]  . Headache     daily  . Peptic ulcer   . Seasonal allergies   . Migraine     1-2 per month  . Hypothyroidism   . Fibromyalgia     Rheumatologist- Dr. Ardis Hughs  . Hypertension   . Hyperlipidemia     History   Social History  . Marital Status: Married    Spouse Name: N/A    Number of Children: N/A  . 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 Smoking status: Never Smoker   . Smokeless tobacco: Never Used  . Alcohol Use: No  . Drug Use: No  . Sexually Active: Not on file   Other Topics Concern  . Not on file   Social History Narrative  . No narrative on file    Past Surgical History  Procedure Laterality Date  . Vaginal hysterectomy  2003  . Cesarean section  1989  . Rectocele repair  2009  . Vagina reconstruction surgery  2011    vaginal mesh repair  . Colonoscopy    . Upper gastrointestinal  endoscopy      Family History  Problem Relation Age of Onset  . Emphysema Mother   . Allergies Mother   . Asthma Mother   . Heart disease Father   . Rheum arthritis Maternal Grandfather   . Cancer Mother   . Cancer Maternal Grandmother   . Heart disease Mother   . Heart disease Other     grandparents    Allergies  Allergen Reactions  . Demerol     Irritability, and shortness of breath      BP 160/90  Pulse 103  Temp(Src) 98.6 F (37 C) (Oral)  Resp 18  Ht 5\' 4"  (1.626 m)  Wt 305 lb 1.3 oz (138.383 kg)  BMI 52.34 kg/m2  SpO2 96%        Objective:   Physical Exam  Constitutional: She is oriented to person, place, and time. She appears well-developed and well-nourished. No distress.  Cardiovascular: Normal rate and regular rhythm.   No murmur heard. Pulmonary/Chest: Effort normal and breath sounds normal. No respiratory distress. She has no wheezes. She has no rales. She exhibits no tenderness.  Neurological: She  is alert and oriented to person, place, and time.  Psychiatric: She has a normal mood and affect. Her behavior is normal. Judgment and thought content normal.          Assessment & Plan:

## 2013-02-14 NOTE — Assessment & Plan Note (Signed)
Management per psychiatry 

## 2013-02-16 ENCOUNTER — Other Ambulatory Visit: Payer: Self-pay | Admitting: Internal Medicine

## 2013-02-16 NOTE — Telephone Encounter (Signed)
Cyclobenzaprine request [Last Rx 01.15.14 #30x3]/SLS Please advise.

## 2013-02-17 ENCOUNTER — Other Ambulatory Visit: Payer: Self-pay | Admitting: Internal Medicine

## 2013-02-20 NOTE — Telephone Encounter (Signed)
Hydrocodone-APAP request [Last Rx 03.10.14 #15x0]/SLS Please advise.

## 2013-02-21 NOTE — Telephone Encounter (Signed)
Ok to sent #30 no refills.

## 2013-02-22 NOTE — Telephone Encounter (Signed)
Rx request to pharmacy/SLS  

## 2013-03-14 ENCOUNTER — Other Ambulatory Visit: Payer: Self-pay | Admitting: Family

## 2013-03-14 ENCOUNTER — Telehealth: Payer: Self-pay | Admitting: Family

## 2013-03-14 DIAGNOSIS — E039 Hypothyroidism, unspecified: Secondary | ICD-10-CM

## 2013-03-14 LAB — BASIC METABOLIC PANEL
BUN: 13 mg/dL (ref 6–23)
CO2: 30 mEq/L (ref 19–32)
Calcium: 9.5 mg/dL (ref 8.4–10.5)
Chloride: 100 mEq/L (ref 96–112)
Creat: 0.71 mg/dL (ref 0.50–1.10)
Glucose, Bld: 153 mg/dL — ABNORMAL HIGH (ref 70–99)
Potassium: 4.4 mEq/L (ref 3.5–5.3)
Sodium: 137 mEq/L (ref 135–145)

## 2013-03-14 LAB — MAGNESIUM: Magnesium: 1.8 mg/dL (ref 1.5–2.5)

## 2013-03-14 LAB — TSH: TSH: 0.015 u[IU]/mL — ABNORMAL LOW (ref 0.350–4.500)

## 2013-03-14 MED ORDER — LEVOTHYROXINE SODIUM 150 MCG PO TABS: 150.0000 ug | ORAL_TABLET | Freq: Every day | ORAL | Status: DC

## 2013-03-14 NOTE — Telephone Encounter (Signed)
Pls call pt and let her know that thyroid dose is too high.  Stop synthroid 100 and .  Start synthroid , repeat TSH in 4-6 weeks.

## 2013-03-14 NOTE — Telephone Encounter (Signed)
Please advise Hydrocodone refill? Last RX was wrote on 02-17-13 quantity 30 with 0 refills

## 2013-03-14 NOTE — Telephone Encounter (Signed)
Left detailed message on cell# re: instruction below and to call if any questions. Future lab order placed.

## 2013-03-15 NOTE — Telephone Encounter (Signed)
OK to send #30 no refill.

## 2013-03-29 ENCOUNTER — Ambulatory Visit (HOSPITAL_BASED_OUTPATIENT_CLINIC_OR_DEPARTMENT_OTHER)
Admission: RE | Admit: 2013-03-29 | Discharge: 2013-03-29 | Disposition: A | Discharge status: Home / Self Care | Payer: BC Managed Care – PPO | Source: Ambulatory Visit | Attending: Family Medicine | Admitting: Family Medicine

## 2013-03-29 ENCOUNTER — Encounter: Payer: Self-pay | Admitting: Family Medicine

## 2013-03-29 ENCOUNTER — Ambulatory Visit (INDEPENDENT_AMBULATORY_CARE_PROVIDER_SITE_OTHER): Payer: BC Managed Care – PPO | Admitting: Family Medicine

## 2013-03-29 VITALS — BP 136/82 | HR 92 | Temp 98.0°F | Ht 64.0 in | Wt 300.1 lb

## 2013-03-29 DIAGNOSIS — M797 Fibromyalgia: Secondary | ICD-10-CM

## 2013-03-29 DIAGNOSIS — R29898 Other symptoms and signs involving the musculoskeletal system: Secondary | ICD-10-CM | POA: Insufficient documentation

## 2013-03-29 DIAGNOSIS — F3289 Other specified depressive episodes: Secondary | ICD-10-CM

## 2013-03-29 DIAGNOSIS — IMO0002 Reserved for concepts with insufficient information to code with codable children: Secondary | ICD-10-CM

## 2013-03-29 DIAGNOSIS — M25512 Pain in left shoulder: Secondary | ICD-10-CM

## 2013-03-29 DIAGNOSIS — N898 Other specified noninflammatory disorders of vagina: Secondary | ICD-10-CM

## 2013-03-29 DIAGNOSIS — N939 Abnormal uterine and vaginal bleeding, unspecified: Secondary | ICD-10-CM

## 2013-03-29 DIAGNOSIS — K219 Gastro-esophageal reflux disease without esophagitis: Secondary | ICD-10-CM

## 2013-03-29 DIAGNOSIS — M6530 Trigger finger, unspecified finger: Secondary | ICD-10-CM

## 2013-03-29 DIAGNOSIS — M25519 Pain in unspecified shoulder: Secondary | ICD-10-CM

## 2013-03-29 DIAGNOSIS — IMO0001 Reserved for inherently not codable concepts without codable children: Secondary | ICD-10-CM

## 2013-03-29 DIAGNOSIS — R197 Diarrhea, unspecified: Secondary | ICD-10-CM

## 2013-03-29 DIAGNOSIS — F329 Major depressive disorder, single episode, unspecified: Secondary | ICD-10-CM

## 2013-03-29 DIAGNOSIS — F32A Depression, unspecified: Secondary | ICD-10-CM

## 2013-03-29 DIAGNOSIS — I10 Essential (primary) hypertension: Secondary | ICD-10-CM

## 2013-03-29 MED ORDER — CHOLESTYRAMINE 4 GM/DOSE PO POWD: 4.0000 g | Freq: Two times a day (BID) | ORAL | Status: DC

## 2013-03-29 NOTE — Progress Notes (Signed)
Quick Note:  Patient Informed and voiced understanding ______ 

## 2013-03-30 ENCOUNTER — Other Ambulatory Visit: Payer: Self-pay | Admitting: Family

## 2013-03-30 NOTE — Telephone Encounter (Signed)
I see you saw her for pain yesterday- I you don't mind, will defer to you re: if refill is appropriate. Thanks.

## 2013-04-02 ENCOUNTER — Encounter: Payer: Self-pay | Admitting: Family Medicine

## 2013-04-02 DIAGNOSIS — K219 Gastro-esophageal reflux disease without esophagitis: Secondary | ICD-10-CM | POA: Insufficient documentation

## 2013-04-02 DIAGNOSIS — M25519 Pain in unspecified shoulder: Secondary | ICD-10-CM

## 2013-04-02 HISTORY — DX: Pain in unspecified shoulder: M25.519

## 2013-04-02 NOTE — Progress Notes (Signed)
Patient ID: Melinda Krause, female   DOB: Oct 25, 1961, 52 y.o.   MRN: [id number] Melinda Krause [id number] 03-19-1961 04/02/2013      Progress Note-Follow Up  Subjective  Chief Complaint  Chief Complaint  Patient presents with  . Leg Pain    pain all over shoulders, neck, X few months- discuss PT    HPI  Joint pain. She notes her overall pain neck pain and shoulder pain has been worsening over several months. She notes her left shoulder is the most painful and describes a sharp pain. Worse with movement but present all the time. She is also complaining of increased pain over her tibial plateaus in her calves but goes on to complain of a diffuse pain everywhere. Has chronic GI upset. Has seen multiple gastroenterologists in the. Complains of tightness and stiffness in her hands feel every night. Complains of urinary frequency and falls with both GYN and urogynecology, Dr. Parke Simmers. Continues to struggle with depression and insomnia he follows with psychiatry for this. No chest pain, palpitations, shortness of breath, fevers or recent illness.  Past Medical History  Diagnosis Date  . History of chicken pox     childhood  . Depression     Counseling-Dr [facility]  . Headache     daily  . Peptic ulcer   . Seasonal allergies   . Migraine     1-2 per month  . Hypothyroidism   . Fibromyalgia     Rheumatologist- Dr. Ardis Hughs  . Hypertension   . Hyperlipidemia   . Pain in joint, shoulder region 04/02/2013    left    Past Surgical History  Procedure Laterality Date  . Vaginal hysterectomy  2003  . Cesarean section  1989  . Rectocele repair  2009  . Vagina reconstruction surgery  2011    vaginal mesh repair  . Colonoscopy    . Upper gastrointestinal endoscopy      Family History  Problem Relation Age of Onset  . Emphysema Mother   . Allergies Mother   . Asthma Mother   . Heart disease Father   . Rheum arthritis Maternal Grandfather   . Cancer  Mother   . Cancer Maternal Grandmother   . Heart disease Mother   . Heart disease Other     grandparents    History   Social History  . Marital Status: Married    Spouse Name: N/A    Number of Children: N/A  . 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 Smoking status: Never Smoker   . Smokeless tobacco: Never Used  . Alcohol Use: No  . Drug Use: No  . Sexually Active: Not on file   Other Topics Concern  . Not on file   Social History Narrative  . No narrative on file    Current Outpatient Prescriptions on File Prior to Visit  Medication Sig Dispense Refill  . ALPRAZolam (XANAX) 0.5 MG tablet Take 1 tablet (0.5 mg total) by mouth 3 (three) times daily as needed.  60 tablet  1  . amLODipine (NORVASC) 10 MG tablet Take 1 tablet (10 mg total) by mouth daily.  30 tablet  1  . Cholecalciferol 1000 UNITS capsule Take 2,000 Units by mouth daily.       Marland Kitchen esomeprazole (NEXIUM) 40 MG capsule Take 40 mg by mouth daily before breakfast.        .  furosemide (LASIX) 20 MG tablet Take 1 tablet (20 mg total) by mouth every morning. As Needed Swelling.  30 tablet  0  . hydrochlorothiazide (HYDRODIURIL) 25 MG tablet Take 25 mg by mouth daily as needed.      . Levomilnacipran HCl ER (FETZIMA) 40 MG CP24 Take 1 tablet by mouth daily.      Marland Kitchen levothyroxine (SYNTHROID, LEVOTHROID) 150 MCG tablet Take 1 tablet (150 mcg total) by mouth daily.  90 tablet  3  . lisinopril (PRINIVIL,ZESTRIL) 40 MG tablet Take 1 tablet (40 mg total) by mouth daily.  30 tablet  6  . metoprolol succinate (TOPROL-XL) 50 MG 24 hr tablet Take 1 tablet (50 mg total) by mouth daily.  30 tablet  6  . Multiple Vitamin (MULTIVITAMIN) tablet Take 1 tablet by mouth daily.        . Probiotic Product (PROBIOTIC FORMULA PO) Take 1 capsule by mouth daily.       . promethazine (PHENERGAN) 25 MG tablet Take 1 tablet every 2 days as needed for nausea.       .  traMADol (ULTRAM) 50 MG tablet TAKE 1 TABLET (50 MG TOTAL) BY MOUTH 2 (TWO) TIMES DAILY AS NEEDED FOR PAIN.  30 tablet  2  . triazolam (HALCION) 0.25 MG tablet Take 1 tablet by mouth at bedtime. Take with Ambien      . zolpidem (AMBIEN CR) 12.5 MG CR tablet Take 12.5 mg by mouth at bedtime as needed.       No current facility-administered medications on file prior to visit.    Allergies  Allergen Reactions  . Demerol     Irritability, and shortness of breath    Review of Systems  Review of Systems  Constitutional: Negative for fever and malaise/fatigue.  HENT: Negative for congestion.   Eyes: Negative for pain and discharge.  Respiratory: Negative for shortness of breath.   Cardiovascular: Negative for chest pain, palpitations and leg swelling.  Gastrointestinal: Positive for heartburn and diarrhea. Negative for nausea and abdominal pain.  Genitourinary: Positive for urgency and frequency. Negative for dysuria.  Musculoskeletal: Positive for myalgias, back pain and joint pain. Negative for falls.  Skin: Negative for rash.  Neurological: Negative for loss of consciousness and headaches.  Endo/Heme/Allergies: Negative for polydipsia.  Psychiatric/Behavioral: Positive for depression. Negative for suicidal ideas. The patient is nervous/anxious. The patient does not have insomnia.     Objective  BP 136/82  Pulse 92  Temp(Src) 98 F (36.7 C) (Oral)  Ht 5\' 4"  (1.626 m)  Wt 300 lb 1.3 oz (136.115 kg)  BMI 51.48 kg/m2  SpO2 95%  Physical Exam  Physical Exam  Constitutional: She is oriented to person, place, and time and well-developed, well-nourished, and in no distress. No distress.  HENT:  Head: Normocephalic and atraumatic.  Eyes: Conjunctivae are normal.  Neck: Neck supple. No thyromegaly present.  Cardiovascular: Normal rate, regular rhythm and normal heart sounds.   No murmur heard. Pulmonary/Chest: Effort normal and breath sounds normal. She has no wheezes.   Abdominal: She exhibits no distension and no mass.  Musculoskeletal: She exhibits no edema.  Lymphadenopathy:    She has no cervical adenopathy.  Neurological: She is alert and oriented to person, place, and time.  Skin: Skin is warm and dry. No rash noted. She is not diaphoretic.  Psychiatric: Memory, affect and judgment normal.    Lab Results  Component Value Date   TSH 0.015* 03/13/2013   Lab Results  Component Value Date  WBC 8.9 09/06/2011   HGB 13.7 09/06/2011   HCT 39.0 09/06/2011   MCV 79.9 09/06/2011   PLT 420* 09/06/2011   Lab Results  Component Value Date   CREATININE 0.71 03/13/2013   BUN 13 03/13/2013   NA 137 03/13/2013   K 4.4 03/13/2013   CL 100 03/13/2013   CO2 30 03/13/2013   Lab Results  Component Value Date   ALT 54* 06/27/2012   AST 78* 06/27/2012   ALKPHOS 96 06/27/2012   BILITOT 1.1 06/27/2012       Assessment & Plan  Hypertension Well controlled no changes today  Pain in joint, shoulder region Sharp and worse over last couple of weeks. Xray unremarkable, may need referral to ortho if worsens, will start a course of PT  Fibromyalgia C/o worsening pain over tibial plateaus and calf. No recent injury. Continue current meds and encouraged as much activity as possible, referred to PT for further consideration  Depression Will continue with psychiatry  Trigger finger of both hands Still tight and drawn up qhs, encouraged Aspercreme prn, start Krill oil caps

## 2013-04-02 NOTE — Assessment & Plan Note (Signed)
Still tight and drawn up qhs, encouraged Aspercreme prn, start Krill oil caps

## 2013-04-02 NOTE — Assessment & Plan Note (Addendum)
Sharp and worse over last couple of weeks. Xray unremarkable, may need referral to ortho if worsens, will start a course of PT

## 2013-04-02 NOTE — Assessment & Plan Note (Signed)
Well controlled no changes today 

## 2013-04-02 NOTE — Assessment & Plan Note (Signed)
Will continue with psychiatry

## 2013-04-02 NOTE — Assessment & Plan Note (Addendum)
C/o worsening pain over tibial plateaus and calf. No recent injury. Continue current meds and encouraged as much activity as possible, referred to PT for further consideration

## 2013-05-01 ENCOUNTER — Other Ambulatory Visit: Payer: Self-pay | Admitting: Family Medicine

## 2013-05-01 NOTE — Telephone Encounter (Signed)
Please advise refill? Last RX was wrote on 03-30-13 quantity 30 with 0 refills  If ok fax to [phone]

## 2013-05-16 ENCOUNTER — Other Ambulatory Visit: Payer: Self-pay | Admitting: Family Medicine

## 2013-06-06 ENCOUNTER — Other Ambulatory Visit: Payer: Self-pay | Admitting: Family Medicine

## 2013-06-07 NOTE — Telephone Encounter (Signed)
OK to send #30 no refills.  

## 2013-06-07 NOTE — Telephone Encounter (Signed)
Please advise refill? Last RX was done on 05-01-13 quantity 30 with 0 refills  If ok fax to [phone]

## 2013-06-15 ENCOUNTER — Other Ambulatory Visit: Payer: Self-pay | Admitting: Gastroenterology

## 2013-06-15 DIAGNOSIS — R110 Nausea: Secondary | ICD-10-CM

## 2013-06-15 DIAGNOSIS — R109 Unspecified abdominal pain: Secondary | ICD-10-CM

## 2013-06-16 ENCOUNTER — Ambulatory Visit: Payer: BC Managed Care – PPO | Admitting: Family

## 2013-06-16 ENCOUNTER — Ambulatory Visit: Payer: BC Managed Care – PPO | Admitting: Physician Assistant

## 2013-06-21 ENCOUNTER — Other Ambulatory Visit: Payer: BC Managed Care – PPO

## 2013-06-23 ENCOUNTER — Encounter: Payer: Self-pay | Admitting: Family

## 2013-06-23 ENCOUNTER — Other Ambulatory Visit: Payer: Self-pay | Admitting: Family

## 2013-06-23 ENCOUNTER — Ambulatory Visit (INDEPENDENT_AMBULATORY_CARE_PROVIDER_SITE_OTHER): Payer: BC Managed Care – PPO | Admitting: Family

## 2013-06-23 VITALS — BP 146/90 | HR 82 | Temp 97.8°F | Resp 18 | Wt 288.1 lb

## 2013-06-23 DIAGNOSIS — N390 Urinary tract infection, site not specified: Secondary | ICD-10-CM

## 2013-06-23 DIAGNOSIS — M5430 Sciatica, unspecified side: Secondary | ICD-10-CM

## 2013-06-23 DIAGNOSIS — N898 Other specified noninflammatory disorders of vagina: Secondary | ICD-10-CM

## 2013-06-23 DIAGNOSIS — N939 Abnormal uterine and vaginal bleeding, unspecified: Secondary | ICD-10-CM

## 2013-06-23 DIAGNOSIS — M5440 Lumbago with sciatica, unspecified side: Secondary | ICD-10-CM | POA: Insufficient documentation

## 2013-06-23 DIAGNOSIS — I10 Essential (primary) hypertension: Secondary | ICD-10-CM

## 2013-06-23 LAB — POCT URINALYSIS DIPSTICK
Bilirubin, UA: NEGATIVE
Glucose, UA: NEGATIVE
Ketones, UA: NEGATIVE
Nitrite, UA: NEGATIVE
Protein, UA: NEGATIVE
Spec Grav, UA: <=1.005
Urobilinogen, UA: 0.2
pH, UA: 8

## 2013-06-23 MED ORDER — CIPROFLOXACIN HCL 500 MG PO TABS: 500.0000 mg | ORAL_TABLET | Freq: Two times a day (BID) | ORAL | Status: DC

## 2013-06-23 MED ORDER — METHYLPREDNISOLONE 4 MG PO KIT: PACK | ORAL | Status: DC

## 2013-06-23 MED ORDER — METOPROLOL SUCCINATE ER 50 MG PO TB24: ORAL_TABLET | ORAL | Status: DC

## 2013-06-23 NOTE — Telephone Encounter (Signed)
eScribe request for refill on hydrocodone-APAP Last filled - 07.16.14, #30x0 [faxed] Last AEX - 08.01.14, spoke with Sandford Craze about this request as pt was seen in office today, per provider VO, #30x0 phoned to pharmacy/SLS

## 2013-06-23 NOTE — Patient Instructions (Addendum)
Please call Dr. Parke Simmers for follow up of your vaginal bleeding.  Start cipro for your probably urinary tract infection- we will send for culture and let you know if it grows bacteria.  Start medrol dose pak for your back. Increase Toprol from 50mg  to 75mg  (1 1/2 tabs) once daily. Follow up in 1 month, sooner if symptoms worsen or if no improvement.

## 2013-06-23 NOTE — Assessment & Plan Note (Signed)
UA notes moderate blood, moderate leuks.  Will send for culture. rx with cipro.

## 2013-06-23 NOTE — Progress Notes (Signed)
Subjective:    Patient ID: Melinda Krause, female    DOB: May 21, 1961, 52 y.o.   MRN: [id number]  HPI  Ms.  Krause is a 52 yr old female who presents today with multiple concerns:  1) Back Pain- started several weeks ago after slipping down the steps carrying laundry.  She reports "pins and needles" in the lower back and radiates down both legs. Has used heating pad.  Reports hx of "messed up discs" after a fall in 2000.    2) Vaginal Bleeding- she reports hx of vaginal mesh for rectocele repair.  Had hysterectomy in 2003.  + blood. This was done by Melinda Krause- urogyn.  She has known area of erosion.    3) Urinary odor- reports strong odor for 2.5 months.  "like a chemical smell."  Denies frequency or dysuria.   4) HTN- no longer taking amlodipine.  She reports that she was dizzy while taking this medication. She is taking metoprolol and lisinopril.  5) Depression-  She follows with Melinda Krause at [facility].  Reports that she saw psych on Monday and citalopram was added to her regimen as she felt that her depression is suboptimally controlled.   Review of Systems See HPI  Past Medical History  Diagnosis Date  . History of chicken pox     childhood  . Depression     Counseling-Dr [facility]  . Headache(784.0)     daily  . Peptic ulcer   . Seasonal allergies   . Migraine     1-2 per month  . Hypothyroidism   . Fibromyalgia     Rheumatologist- Dr. Ardis Hughs  . Hypertension   . Hyperlipidemia   . Pain in joint, shoulder region 04/02/2013    left    History   Social History  . Marital Status: Married    Spouse Name: N/A    Number of Children: N/A  . 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 Smoking status: Never Smoker   . Smokeless tobacco: Never Used  . Alcohol Use: No  . Drug Use: No  . Sexually Active: Not on file   Other Topics Concern  . Not on  file   Social History Narrative  . No narrative on file    Past Surgical History  Procedure Laterality Date  . Vaginal hysterectomy  2003  . Cesarean section  1989  . Rectocele repair  2009  . Vagina reconstruction surgery  2011    vaginal mesh repair  . Colonoscopy    . Upper gastrointestinal endoscopy      Family History  Problem Relation Age of Onset  . Emphysema Mother   . Allergies Mother   . Asthma Mother   . Heart disease Father   . Rheum arthritis Maternal Grandfather   . Cancer Mother   . Cancer Maternal Grandmother   . Heart disease Mother   . Heart disease Other     grandparents    Allergies  Allergen Reactions  . Demerol     Irritability, and shortness of breath      BP 146/90  Pulse 82  Temp(Src) 97.8 F (36.6 C) (Oral)  Resp 18  Wt 288 lb 1.3 oz (130.672 kg)  BMI 49.42 kg/m2  SpO2 97%       Objective:   Physical Exam  Constitutional: She is oriented  to person, place, and time. She appears well-developed and well-nourished. No distress.  Cardiovascular: Normal rate and regular rhythm.   No murmur heard. Pulmonary/Chest: Effort normal and breath sounds normal. No respiratory distress. She has no wheezes. She has no rales. She exhibits no tenderness.  Musculoskeletal: She exhibits no edema.       Thoracic back: She exhibits tenderness.       Lumbar back: She exhibits tenderness.  Bilateral LE strength is 5/5.   Neurological: She is alert and oriented to person, place, and time.  Psychiatric: She has a normal mood and affect. Her behavior is normal. Judgment and thought content normal.          Assessment & Plan:

## 2013-06-23 NOTE — Assessment & Plan Note (Signed)
Uncontrolled. Continue lisinopril. Increase toprol xl from 50mg  to 75mg . Follow up in 1 month.

## 2013-06-23 NOTE — Assessment & Plan Note (Signed)
I advised pt to follow up with her urogyn in [place] - she is agreeable and states that she will arrange.

## 2013-06-23 NOTE — Assessment & Plan Note (Signed)
Trial of medrol dose pak. If no improvement consider additional imaging and PT referral.

## 2013-06-25 LAB — URINE CULTURE: Colony Count: 65000

## 2013-07-03 ENCOUNTER — Other Ambulatory Visit: Payer: Self-pay | Admitting: Family Medicine

## 2013-07-03 ENCOUNTER — Other Ambulatory Visit: Payer: Self-pay | Admitting: Internal Medicine

## 2013-07-03 NOTE — Telephone Encounter (Signed)
Ok to call in #30 tabs no refills. 

## 2013-07-04 NOTE — Telephone Encounter (Signed)
Refill called to pharmacy voicemail. 

## 2013-07-05 NOTE — Telephone Encounter (Signed)
Spoke to [facility], she states pt filled 30 tablets on 6/24, 7/1 and 06/20/13.  Please advise re: cyclobenzaprine request.

## 2013-07-05 NOTE — Telephone Encounter (Signed)
Needs appt to keep taking this much of the medicine, she is picking it up frequently and it was meant to be as needed.

## 2013-07-06 NOTE — Telephone Encounter (Signed)
Is this one done?

## 2013-07-07 NOTE — Telephone Encounter (Signed)
Left detailed message on pt's cell re: need for reassessment in the office. Pt currently scheduled with Sandford Craze, NP on 07/28/13. Advised pt to let us know if she would like to address this at that visit or schedule separate appt with Dr Abner Greenspan. Per verbal from [place], she is ok to discuss this with pt at 07/28/13 visit if she wishes.

## 2013-07-10 NOTE — Telephone Encounter (Signed)
Pt called and left a vm stating that she needed her Flexeril refilled due to her having Fibromyalgia, lower back pain, and feels like pens and needles in the back of leg.  Verbal per MD patient needs appt like Nicki Guadalajara had explained.   I will call and inform pt in the morning.

## 2013-07-28 ENCOUNTER — Ambulatory Visit: Payer: BC Managed Care – PPO | Admitting: Family

## 2013-07-28 DIAGNOSIS — Z0289 Encounter for other administrative examinations: Secondary | ICD-10-CM

## 2013-08-01 ENCOUNTER — Other Ambulatory Visit: Payer: Self-pay | Admitting: Family

## 2013-08-02 NOTE — Telephone Encounter (Signed)
Refill request for Hydrocodone-acetaminophen Last filled by MD on - 06/23/13/ #30 x0 Last Seen- 06/23/13 Next Appt: 07/28/13 (no show) Please advise refill?

## 2013-08-07 ENCOUNTER — Emergency Department (HOSPITAL_COMMUNITY)
Admission: EM | Admit: 2013-08-07 | Discharge: 2013-08-07 | Disposition: A | Discharge status: Other Acute Inpatient Hospital | Payer: BC Managed Care – PPO | Attending: Emergency Medicine | Admitting: Emergency Medicine

## 2013-08-07 ENCOUNTER — Inpatient Hospital Stay (HOSPITAL_COMMUNITY)
Admission: RE | Admit: 2013-08-07 | Discharge: 2013-08-11 | DRG: 425 | Disposition: A | Discharge status: Home / Self Care | Payer: BC Managed Care – PPO | Attending: Psychiatry | Admitting: Psychiatry

## 2013-08-07 ENCOUNTER — Encounter (HOSPITAL_COMMUNITY): Payer: Self-pay | Admitting: *Deleted

## 2013-08-07 DIAGNOSIS — K760 Fatty (change of) liver, not elsewhere classified: Secondary | ICD-10-CM

## 2013-08-07 DIAGNOSIS — R51 Headache: Secondary | ICD-10-CM

## 2013-08-07 DIAGNOSIS — Z8739 Personal history of other diseases of the musculoskeletal system and connective tissue: Secondary | ICD-10-CM | POA: Insufficient documentation

## 2013-08-07 DIAGNOSIS — G43909 Migraine, unspecified, not intractable, without status migrainosus: Secondary | ICD-10-CM | POA: Diagnosis present

## 2013-08-07 DIAGNOSIS — R634 Abnormal weight loss: Secondary | ICD-10-CM

## 2013-08-07 DIAGNOSIS — G4700 Insomnia, unspecified: Secondary | ICD-10-CM

## 2013-08-07 DIAGNOSIS — F3289 Other specified depressive episodes: Secondary | ICD-10-CM | POA: Insufficient documentation

## 2013-08-07 DIAGNOSIS — E039 Hypothyroidism, unspecified: Secondary | ICD-10-CM

## 2013-08-07 DIAGNOSIS — F329 Major depressive disorder, single episode, unspecified: Secondary | ICD-10-CM

## 2013-08-07 DIAGNOSIS — R45851 Suicidal ideations: Secondary | ICD-10-CM

## 2013-08-07 DIAGNOSIS — Z79899 Other long term (current) drug therapy: Secondary | ICD-10-CM

## 2013-08-07 DIAGNOSIS — M797 Fibromyalgia: Secondary | ICD-10-CM

## 2013-08-07 DIAGNOSIS — I10 Essential (primary) hypertension: Secondary | ICD-10-CM | POA: Insufficient documentation

## 2013-08-07 DIAGNOSIS — F32A Depression, unspecified: Secondary | ICD-10-CM

## 2013-08-07 DIAGNOSIS — Z8711 Personal history of peptic ulcer disease: Secondary | ICD-10-CM | POA: Insufficient documentation

## 2013-08-07 DIAGNOSIS — K219 Gastro-esophageal reflux disease without esophagitis: Secondary | ICD-10-CM | POA: Diagnosis present

## 2013-08-07 DIAGNOSIS — IMO0001 Reserved for inherently not codable concepts without codable children: Secondary | ICD-10-CM | POA: Insufficient documentation

## 2013-08-07 DIAGNOSIS — E785 Hyperlipidemia, unspecified: Secondary | ICD-10-CM

## 2013-08-07 DIAGNOSIS — R109 Unspecified abdominal pain: Secondary | ICD-10-CM

## 2013-08-07 DIAGNOSIS — Z8619 Personal history of other infectious and parasitic diseases: Secondary | ICD-10-CM | POA: Insufficient documentation

## 2013-08-07 DIAGNOSIS — R739 Hyperglycemia, unspecified: Secondary | ICD-10-CM

## 2013-08-07 DIAGNOSIS — E876 Hypokalemia: Secondary | ICD-10-CM

## 2013-08-07 DIAGNOSIS — G479 Sleep disorder, unspecified: Secondary | ICD-10-CM | POA: Insufficient documentation

## 2013-08-07 DIAGNOSIS — F332 Major depressive disorder, recurrent severe without psychotic features: Secondary | ICD-10-CM | POA: Diagnosis present

## 2013-08-07 DIAGNOSIS — M542 Cervicalgia: Secondary | ICD-10-CM

## 2013-08-07 DIAGNOSIS — N939 Abnormal uterine and vaginal bleeding, unspecified: Secondary | ICD-10-CM

## 2013-08-07 DIAGNOSIS — IMO0002 Reserved for concepts with insufficient information to code with codable children: Secondary | ICD-10-CM

## 2013-08-07 DIAGNOSIS — F411 Generalized anxiety disorder: Principal | ICD-10-CM | POA: Diagnosis present

## 2013-08-07 DIAGNOSIS — N390 Urinary tract infection, site not specified: Secondary | ICD-10-CM

## 2013-08-07 DIAGNOSIS — G4733 Obstructive sleep apnea (adult) (pediatric): Secondary | ICD-10-CM

## 2013-08-07 LAB — COMPREHENSIVE METABOLIC PANEL
ALT: 39 U/L — ABNORMAL HIGH (ref 0–35)
AST: 45 U/L — ABNORMAL HIGH (ref 0–37)
Albumin: 4.2 g/dL (ref 3.5–5.2)
Alkaline Phosphatase: 101 U/L (ref 39–117)
BUN: 6 mg/dL (ref 6–23)
CO2: 23 mEq/L (ref 19–32)
Calcium: 10 mg/dL (ref 8.4–10.5)
Chloride: 101 mEq/L (ref 96–112)
Creatinine, Ser: 0.83 mg/dL (ref 0.50–1.10)
GFR calc Af Amer: >90 mL/min (ref >90)
GFR calc non Af Amer: 80 mL/min — ABNORMAL LOW (ref >90)
Glucose, Bld: 120 mg/dL — ABNORMAL HIGH (ref 70–99)
Potassium: 4.1 mEq/L (ref 3.5–5.1)
Sodium: 139 mEq/L (ref 135–145)
Total Bilirubin: 1.3 mg/dL — ABNORMAL HIGH (ref 0.3–1.2)
Total Protein: 7.6 g/dL (ref 6.0–8.3)

## 2013-08-07 LAB — ETHANOL: Alcohol, Ethyl (B): <11 mg/dL (ref 0–11)

## 2013-08-07 LAB — RAPID URINE DRUG SCREEN, HOSP PERFORMED
Amphetamines: NOT DETECTED
Barbiturates: NOT DETECTED
Benzodiazepines: NOT DETECTED
Cocaine: NOT DETECTED
Opiates: POSITIVE — AB
Tetrahydrocannabinol: NOT DETECTED

## 2013-08-07 LAB — CBC
HCT: 42.4 % (ref 36.0–46.0)
Hemoglobin: 13.6 g/dL (ref 12.0–15.0)
MCH: 23.9 pg — ABNORMAL LOW (ref 26.0–34.0)
MCHC: 32.1 g/dL (ref 30.0–36.0)
MCV: 74.6 fL — ABNORMAL LOW (ref 78.0–100.0)
Platelets: 542 10*3/uL — ABNORMAL HIGH (ref 150–400)
RBC: 5.68 MIL/uL — ABNORMAL HIGH (ref 3.87–5.11)
RDW: 15 % (ref 11.5–15.5)
WBC: 9.7 10*3/uL (ref 4.0–10.5)

## 2013-08-07 LAB — SALICYLATE LEVEL: Salicylate Lvl: <2 mg/dL — ABNORMAL LOW (ref 2.8–20.0)

## 2013-08-07 LAB — ACETAMINOPHEN LEVEL: Acetaminophen (Tylenol), Serum: <15 ug/mL (ref 10–30)

## 2013-08-07 MED ORDER — ALUM & MAG HYDROXIDE-SIMETH 200-200-20 MG/5ML PO SUSP
30.0000 mL | ORAL | Status: DC | PRN
  Administered 2013-08-08 – 2013-08-09 (×2): 30 mL via ORAL

## 2013-08-07 MED ORDER — ADULT MULTIVITAMIN W/MINERALS CH
1.0000 | ORAL_TABLET | Freq: Every day | ORAL | Status: DC
  Administered 2013-08-08 – 2013-08-11 (×4): 1 via ORAL
  Filled 2013-08-07 (×5): qty 1

## 2013-08-07 MED ORDER — ACETAMINOPHEN 325 MG PO TABS
650.0000 mg | ORAL_TABLET | Freq: Four times a day (QID) | ORAL | Status: DC | PRN
Start: 2013-08-07 — End: 2013-08-11
  Administered 2013-08-08 – 2013-08-10 (×6): 650 mg via ORAL

## 2013-08-07 MED ORDER — HYDROCHLOROTHIAZIDE 25 MG PO TABS: 25.0000 mg | ORAL_TABLET | Freq: Every day | ORAL | Status: DC | PRN

## 2013-08-07 MED ORDER — ZOLPIDEM TARTRATE 5 MG PO TABS
5.0000 mg | ORAL_TABLET | Freq: Every evening | ORAL | Status: DC | PRN
  Administered 2013-08-07: 5 mg via ORAL
  Filled 2013-08-07: qty 1

## 2013-08-07 MED ORDER — TRAZODONE HCL 50 MG PO TABS: 50.0000 mg | ORAL_TABLET | Freq: Every evening | ORAL | Status: DC | PRN

## 2013-08-07 MED ORDER — LORATADINE 10 MG PO TABS: 10.0000 mg | ORAL_TABLET | Freq: Every day | ORAL | Status: DC | PRN

## 2013-08-07 MED ORDER — METOPROLOL SUCCINATE ER 25 MG PO TB24
75.0000 mg | ORAL_TABLET | Freq: Every day | ORAL | Status: DC
  Administered 2013-08-08 – 2013-08-11 (×4): 75 mg via ORAL
  Filled 2013-08-07 (×5): qty 1

## 2013-08-07 MED ORDER — LEVOTHYROXINE SODIUM 150 MCG PO TABS
150.0000 ug | ORAL_TABLET | Freq: Every day | ORAL | Status: DC
  Administered 2013-08-08 – 2013-08-09 (×2): 150 ug via ORAL
  Filled 2013-08-07 (×4): qty 1

## 2013-08-07 MED ORDER — CYCLOBENZAPRINE HCL 10 MG PO TABS
5.0000 mg | ORAL_TABLET | Freq: Three times a day (TID) | ORAL | Status: DC | PRN
  Administered 2013-08-08 – 2013-08-10 (×8): 5 mg via ORAL
  Filled 2013-08-07 (×7): qty 1
  Filled 2013-08-07: qty 2

## 2013-08-07 MED ORDER — MAGNESIUM HYDROXIDE 400 MG/5ML PO SUSP: 30.0000 mL | Freq: Every day | ORAL | Status: DC | PRN

## 2013-08-07 MED ORDER — PANTOPRAZOLE SODIUM 40 MG PO TBEC
40.0000 mg | DELAYED_RELEASE_TABLET | Freq: Every day | ORAL | Status: DC
  Administered 2013-08-08 – 2013-08-11 (×4): 40 mg via ORAL
  Filled 2013-08-07 (×5): qty 1

## 2013-08-07 NOTE — BH Assessment (Signed)
Spoke with Charge Nurse Dutch Quint, RN at [facility] to inform him that pt will be presenting to [facility] for medical clearance. Once pt is medically cleared pt can be accepted to [facility]. Pt accepted to [facility] by Verne Spurr PA, pending medical clearance.   Glorious Peach, MS, LCASA Assessment Counselor

## 2013-08-07 NOTE — ED Provider Notes (Signed)
Medical screening examination/treatment/procedure(s) were performed by non-physician practitioner and as supervising physician I was immediately available for consultation/collaboration.   Gilda Crease, MD 08/07/13 2036

## 2013-08-07 NOTE — ED Notes (Signed)
Pt states that she has been feeling depressed for sometime now and has started having suicidal thoughts. Pt is accompanied by her husband who seems supportive. Went to [facility] first who sent her over here to be medically cleared. Pt was stopped by husband before trying to cut herself. Alert and oriented and cooperative. Protocol of medical clearance explained and pt obliged.

## 2013-08-07 NOTE — ED Notes (Signed)
All pt belongings given to husband

## 2013-08-07 NOTE — ED Provider Notes (Signed)
CSN: [id number]     Arrival date & time 08/07/13  1702 History   First MD Initiated Contact with Patient 08/07/13 1727     Chief Complaint  Patient presents with  . Medical Clearance   (Consider location/radiation/quality/duration/timing/severity/associated sxs/prior Treatment) HPI Comments: Patient is a 52 year old female past medical history significant for depression, fibromyalgia presented to emergency department for increased depression and suicidal ideation. Patient states since July she is still with increased stress with her family and job leading to a change in her antidepressant medications patient states since then she's felt her depression has been less controlled. She states over the last 2 weeks she has been dealing with suicidal ideation and yesterday she was holding a knife thinking about using it to kill herself. Patient has never dealt with suicidal ideation in the past. She denies any homicidal ideation, hallucinations, recreational drug or alcohol use. She has no physical complaints. Patient is here voluntarily seeking help.   Past Medical History  Diagnosis Date  . History of chicken pox     childhood  . Depression     Counseling-Dr [facility]  . Headache(784.0)     daily  . Peptic ulcer   . Seasonal allergies   . Migraine     1-2 per month  . Hypothyroidism   . Fibromyalgia     Rheumatologist- Dr. Ardis Hughs  . Hypertension   . Hyperlipidemia   . Pain in joint, shoulder region 04/02/2013    left   Past Surgical History  Procedure Laterality Date  . Vaginal hysterectomy  2003  . Cesarean section  1989  . Rectocele repair  2009  . Vagina reconstruction surgery  2011    vaginal mesh repair  . Colonoscopy    . Upper gastrointestinal endoscopy     Family History  Problem Relation Age of Onset  . Emphysema Mother   . Allergies Mother   . Asthma Mother   . Heart disease Father   . Rheum arthritis Maternal Grandfather   . Cancer  Mother   . Cancer Maternal Grandmother   . Heart disease Mother   . Heart disease Other     grandparents   History  Substance Use Topics  . Smoking status: Never Smoker   . Smokeless tobacco: Never Used  . Alcohol Use: No   OB History   Grav Para Term Preterm Abortions TAB SAB Ect Mult Living                 Review of Systems  Constitutional: Negative for fever and chills.  Respiratory: Negative for shortness of breath.   Cardiovascular: Negative for chest pain.  Psychiatric/Behavioral: Positive for suicidal ideas, sleep disturbance and dysphoric mood. Negative for hallucinations and self-injury. The patient is not nervous/anxious.   All other systems reviewed and are negative.    Allergies  Demerol  Home Medications   Current Outpatient Rx  Name  Route  Sig  Dispense  Refill  . ALPRAZolam (XANAX) 0.25 MG tablet   Oral   Take 0.25 mg by mouth 3 (three) times daily as needed for anxiety.         Marland Kitchen buPROPion (WELLBUTRIN XL) 300 MG 24 hr tablet   Oral   Take 1 tablet (300 mg total) by mouth daily.         . citalopram (CELEXA) 20 MG tablet   Oral   Take 20 mg by mouth daily.         Marland Kitchen esomeprazole (  NEXIUM) 40 MG capsule   Oral   Take 40 mg by mouth daily before breakfast.           . hydrochlorothiazide (HYDRODIURIL) 25 MG tablet   Oral   Take 25 mg by mouth daily as needed (for hypertension/edema).         Marland Kitchen HYDROcodone-acetaminophen (NORCO/VICODIN) 5-325 MG per tablet   Oral   Take 1 tablet by mouth every 6 (six) hours as needed for pain.         Marland Kitchen levothyroxine (SYNTHROID, LEVOTHROID) 150 MCG tablet   Oral   Take 1 tablet (150 mcg total) by mouth daily.   90 tablet   3   . loperamide (IMODIUM) 2 MG capsule   Oral   Take 2 mg by mouth 4 (four) times daily as needed for diarrhea or loose stools.         Marland Kitchen loratadine (CLARITIN) 10 MG tablet   Oral   Take 10 mg by mouth daily as needed for allergies.         . metoprolol succinate  (TOPROL-XL) 50 MG 24 hr tablet   Oral   Take 75 mg by mouth daily. Take with or immediately following a meal.         . Multiple Vitamin (MULTIVITAMIN WITH MINERALS) TABS tablet   Oral   Take 1 tablet by mouth daily.         . promethazine (PHENERGAN) 25 MG tablet   Oral   Take 12.5-25 mg by mouth every 6 (six) hours as needed for nausea.         Marland Kitchen zolpidem (AMBIEN CR) 12.5 MG CR tablet   Oral   Take 12.5 mg by mouth at bedtime.          BP 165/91  Pulse 89  Temp(Src) 98.5 F (36.9 C) (Oral)  Resp 18  SpO2 98% Physical Exam  Constitutional: She is oriented to person, place, and time. She appears well-developed and well-nourished.  HENT:  Head: Normocephalic and atraumatic.  Eyes: Conjunctivae are normal.  Neck: Neck supple.  Cardiovascular: Normal rate, regular rhythm and normal heart sounds.   Pulmonary/Chest: Effort normal and breath sounds normal.  Abdominal: Soft. There is no tenderness.  Neurological: She is alert and oriented to person, place, and time.  Skin: Skin is warm and dry.  Psychiatric: She is not actively hallucinating. She exhibits a depressed mood. She expresses suicidal ideation. She expresses no homicidal ideation. She expresses suicidal plans. She expresses no homicidal plans.    ED Course  Procedures (including critical care time) Labs Review Labs Reviewed  CBC - Abnormal; Notable for the following:    RBC 5.68 (*)    MCV 74.6 (*)    MCH 23.9 (*)    Platelets 542 (*)    All other components within normal limits  COMPREHENSIVE METABOLIC PANEL - Abnormal; Notable for the following:    Glucose, Bld 120 (*)    AST 45 (*)    ALT 39 (*)    Total Bilirubin 1.3 (*)    GFR calc non Af Amer 80 (*)    All other components within normal limits  SALICYLATE LEVEL - Abnormal; Notable for the following:    Salicylate Lvl <2.0 (*)    All other components within normal limits  URINE RAPID DRUG SCREEN (HOSP PERFORMED) - Abnormal; Notable for the  following:    Opiates POSITIVE (*)    All other components within normal limits  ACETAMINOPHEN  LEVEL  ETHANOL   Imaging Review No results found.  MDM   1. Depression   2. Suicidal ideation    Afebrile, NAD, non-toxic appearing, AAOx4. Pt presents to the ED for medical clearance.  Pt is suicidal with plan to use 9. She denied homicidal ideation, elucidations, alcohol or recreational drug use. The patients demeanor is dysphoric. Admits difficulty sleeping, loss of interests, feelings of worthlessness, lack of energy, difficulty concentrating, loss of appetite, feelings of anxiety. The patient currently does not have any acute physical complaints and is in no acute distress. The patients demeanor is depressed. The patient was initially brought to the ED by self seeking voluntary assistance. Patient was seen at [facility] prior to arriving at ED and has a bed waiting. Pt requesting to leave, IVC placed by Dr. Blinda Leatherwood. ACT consult was appreciated and pt will be transferred to [facility]. Patient d/w with Dr. Silverio Lay, agrees with plan. Patient is stable at time of shift change.        Jeannetta Ellis, PA-C 08/07/13 2027

## 2013-08-07 NOTE — ED Notes (Signed)
Spoke with Minerva Areola at [facility] who states that patient has bed assigned at [facility]. Rm 500-2.

## 2013-08-07 NOTE — Progress Notes (Signed)
Patient ID: LYNNIE KOEHLER, female   DOB: 1961-09-02, 52 y.o.   MRN: [id number]  Pt was tearful during the adm process. Stated that at "apprx 1500 she was feeling suicidal and called her Dr." According to pt she has been dealing with depression for several years. States she had a plan to cut her wrists, then call 911 so her husband wouldn't have to come home and discover her body.  Pt has been a home health nurse for 30 yrs. States she took leave to care for her mother and father. Pt stated that one month after her father died, her mom went to visit pt's sister in [place].  While there pt's mom had a "stroke in June". Pt has also had recent med changes with the addition of celexa and decrease of wellbutrin from 300 mg to 150 mg. States she was having increased depression for a couple months. Pt states she hasn't left her home in 2 wks "withdrawing".  Pt doesn't speak to her family, including her children. When asked if the strained relationship had anything to do with her mother, pt shook her head up/down for yes. Writer introduced pt to the unit and informed that extender would be notified for orders. Pt denied SI, HI, and A/V during adm.

## 2013-08-07 NOTE — BH Assessment (Signed)
Contacted [employer], TTS at [facility] to notify her that pt has been accepted to bed 500-2 by  Verne Spurr PA, assigned to Dr. Elsie Saas. Alma Sink agreed to have patient sign all support documentation for [facility] admission.   Glorious Peach, MS, LCASA Assessment Counselor

## 2013-08-07 NOTE — BH Assessment (Addendum)
Assessment Note  Melinda Krause is an 52 y.o. female. Pt presents with C/O increased depression and suicidal ideations. Pt reports that this is the first time that she has been suicidal. Pt presents tearful. Pt reports that she contacted her provider at [facility] and informed her that she was depressed and suicidal and they recommended that she present to [facility] for an evaluation. Pt reports stressors to include financial problems, as she reports that her husband works two jobs and they are barely Restaurant manager, fast food. Pt reports "we have no money". Pt reports that she had to seek financial assistance from her church to pay her rent this month. Pt reports chronic medical problems to include frequent diarrhea and nausea r/t abdominal pain. Pt reports that her mother recently had a stroke and she had to go to [place] to help care for her. Pt reports that her husband works all the time and she hardly sees him. Pt reports a recent med change a couple months ago and reports that Citalopram was added to her medication regimen. Pt reports that she got a knife out of her kitchen last week and poked her skin with thoughts and intentions of killing herself. Pt denies HI and no AVH.  Pt is unable to contract for safety and inpatient treatment recommended for safety and stabilization.  Consulted with AC Thurman Coyer and Verne Spurr PA who agreed to admit pt to [facility] for inpatient treatment once patient is medically cleared at [facility]. Pt will be assigned to 500-2.  Axis I: Major Depression, single episode Axis II: Deferred Axis III:  Past Medical History  Diagnosis Date  . History of chicken pox     childhood  . Depression     Counseling-Dr [facility]  . Headache(784.0)     daily  . Peptic ulcer   . Seasonal allergies   . Migraine     1-2 per month  . Hypothyroidism   . Fibromyalgia     Rheumatologist- Dr. Ardis Hughs  . Hypertension   . Hyperlipidemia   . Pain in joint, shoulder  region 04/02/2013    left   Axis IV: economic problems, other psychosocial or environmental problems, problems related to social environment and problems with primary support group Axis V: 21-30 behavior considerably influenced by delusions or hallucinations OR serious impairment in judgment, communication OR inability to function in almost all areas  Past Medical History:  Past Medical History  Diagnosis Date  . History of chicken pox     childhood  . Depression     Counseling-Dr [facility]  . Headache(784.0)     daily  . Peptic ulcer   . Seasonal allergies   . Migraine     1-2 per month  . Hypothyroidism   . Fibromyalgia     Rheumatologist- Dr. Ardis Hughs  . Hypertension   . Hyperlipidemia   . Pain in joint, shoulder region 04/02/2013    left    Past Surgical History  Procedure Laterality Date  . Vaginal hysterectomy  2003  . Cesarean section  1989  . Rectocele repair  2009  . Vagina reconstruction surgery  2011    vaginal mesh repair  . Colonoscopy    . Upper gastrointestinal endoscopy      Family History:  Family History  Problem Relation Age of Onset  . Emphysema Mother   . Allergies Mother   . Asthma Mother   . Heart disease Father   . Rheum arthritis  Maternal Grandfather   . Cancer Mother   . Cancer Maternal Grandmother   . Heart disease Mother   . Heart disease Other     grandparents    Social History:  reports that she has never smoked. She has never used smokeless tobacco. She reports that she does not drink alcohol or use illicit drugs.  Additional Social History:  Alcohol / Drug Use Over the Counter:  (Claritin, Imodium) History of alcohol / drug use?: No history of alcohol / drug abuse  CIWA:   COWS:    Allergies:  Allergies  Allergen Reactions  . Demerol     Irritability, and shortness of breath    Home Medications:  (Not in a hospital admission)  OB/GYN Status:  No LMP recorded. Patient has had a  hysterectomy.  General Assessment Data Location of Assessment: [facility] Is this a Tele or Face-to-Face Assessment?: Face-to-Face Is this an Initial Assessment or a Re-assessment for this encounter?: Initial Assessment Living Arrangements: Spouse/significant other Can pt return to current living arrangement?: Yes Admission Status: Voluntary Is patient capable of signing voluntary admission?: Yes Transfer from: Home Referral Source: Other ([facility] )  Medical Screening Exam [facility] Walk[facility]) Medical Exam completed: No Reason for MSE not completed: Other: (Pt transferred to [facility] for Medical Clearance)  [facility] Crisis Care Plan Living Arrangements: Spouse/significant other Name of Psychiatrist: Tamela Oddi,   PA Name of Therapist: [facility]      Risk to self Suicidal Ideation: Yes-Currently Present Suicidal Intent: Yes-Currently Present Is patient at risk for suicide?: Yes Suicidal Plan?: No Access to Means: Yes Specify Access to Suicidal Means: sharps What has been your use of drugs/alcohol within the last 12 months?: None reported Previous Attempts/Gestures: No How many times?: 0 Other Self Harm Risks: Pt thought about cutting with a knife last week with intent to harm/kill self Triggers for Past Attempts: Other personal contacts;Other (Comment) (financial, health problems) Intentional Self Injurious Behavior:  (Pt had thoughts of cutting herself w/knife last week) Family Suicide History: No Recent stressful life event(s): Financial Problems;Other (Comment) (medical problems, marital turmoil) Persecutory voices/beliefs?: No Depression: Yes Depression Symptoms: Insomnia;Tearfulness;Isolating;Loss of interest in usual pleasures;Feeling worthless/self pity;Feeling angry/irritable Substance abuse history and/or treatment for substance abuse?: No Suicide prevention information given to non-admitted patients: Not applicable  Risk to Others Homicidal  Ideation: No Thoughts of Harm to Others: No Current Homicidal Intent: No Current Homicidal Plan: No Access to Homicidal Means: No Identified Victim: na History of harm to others?: No Assessment of Violence: None Noted Violent Behavior Description: None Noted Does patient have access to weapons?: No Criminal Charges Pending?: No Does patient have a court date: No  Psychosis Hallucinations: None noted Delusions: None noted  Mental Status Report Appear/Hygiene: Other (Comment) (Casual,Appropriate) Eye Contact: Fair Motor Activity: Freedom of movement Speech: Logical/coherent Level of Consciousness: Alert;Crying Mood: Depressed Affect: Appropriate to circumstance;Depressed Anxiety Level: Minimal Thought Processes: Coherent;Relevant Judgement: Unimpaired Orientation: Person;Place;Time;Situation Obsessive Compulsive Thoughts/Behaviors: None  Cognitive Functioning Concentration: Normal Memory: Recent Intact;Remote Intact IQ: Average Insight: Fair Impulse Control: Poor Appetite: Fair Weight Loss: 0 Weight Gain: 0 Sleep: Decreased (pt reports issues with sleep even with Ambien sleep aide) Total Hours of Sleep:  (maybe 4-5 hours of sleep) Vegetative Symptoms: None  ADLScreening [facility] Services) Patient's cognitive ability adequate to safely complete daily activities?: Yes Patient able to express need for assistance with ADLs?: Yes Independently performs ADLs?: Yes (appropriate for developmental age)  Prior Inpatient Therapy Prior Inpatient Therapy: No  Prior Therapy Dates: na Prior Therapy Facilty/Provider(s): na Reason for Treatment: na  Prior Outpatient Therapy Prior Outpatient Therapy: Yes Prior Therapy Dates: [facility] Prior Therapy Facilty/Provider(s): [facility] Reason for Treatment: Depression  ADL Screening (condition at time of admission) Patient's cognitive ability adequate to safely complete daily activities?: Yes Is  the patient deaf or have difficulty hearing?: No Does the patient have difficulty seeing, even when wearing glasses/contacts?: No Does the patient have difficulty concentrating, remembering, or making decisions?: No Patient able to express need for assistance with ADLs?: Yes Does the patient have difficulty dressing or bathing?: No Independently performs ADLs?: Yes (appropriate for developmental age) Does the patient have difficulty walking or climbing stairs?: No Weakness of Legs: None Weakness of Arms/Hands: None  Home Assistive Devices/Equipment Home Assistive Devices/Equipment: None    Abuse/Neglect Assessment (Assessment to be complete while patient is alone) Physical Abuse: Denies Verbal Abuse: Denies Sexual Abuse: Denies Exploitation of patient/patient's resources: Denies Self-Neglect: Denies     Merchant navy officer (For Healthcare) Advance Directive: Patient does not have advance directive;Patient would not like information    Additional Information 1:1 In Past 12 Months?: No CIRT Risk: No Elopement Risk: No Does patient have medical clearance?: No     Disposition:  Disposition Initial Assessment Completed for this Encounter: Yes Disposition of Patient: Inpatient treatment program (accepted to [facility] once she is medically cleared) Type of inpatient treatment program: Adult (Pt referred to [facility] for medical clearance)  On Site Evaluation by:   Reviewed with Physician:    Bjorn Pippin .Glorious Peach, MS, LCASA Assessment Counselor  08/07/2013 6:47 PM

## 2013-08-07 NOTE — Tx Team (Signed)
Initial Interdisciplinary Treatment Plan  PATIENT STRENGTHS: (choose at least two) Ability for insight Average or above average intelligence Capable of independent living Communication skills General fund of knowledge Motivation for treatment/growth Physical Health Religious Affiliation Work skills  PATIENT STRESSORS: Financial difficulties Loss of Dad in 2012 Marital or family conflict Medication change or noncompliance   PROBLEM LIST: Problem List/Patient Goals Date to be addressed Date deferred Reason deferred Estimated date of resolution  "To stop suicidal thoughts" 08/07/13     "I need intense therapy that my husband can't give" 08/07/13           depression 08/07/13     Increased risk for suicide 08/07/13                              DISCHARGE CRITERIA:  Ability to meet basic life and health needs Adequate post-discharge living arrangements Improved stabilization in mood, thinking, and/or behavior Medical problems require only outpatient monitoring Motivation to continue treatment in a less acute level of care Need for constant or close observation no longer present Reduction of life-threatening or endangering symptoms to within safe limits Safe-care adequate arrangements made Verbal commitment to aftercare and medication compliance  PRELIMINARY DISCHARGE PLAN: Attend aftercare/continuing care group Outpatient therapy Participate in family therapy Return to previous living arrangement  PATIENT/FAMIILY INVOLVEMENT: This treatment plan has been presented to and reviewed with the patient, Melinda Krause, and/or family member.  The patient and family have been given the opportunity to ask questions and make suggestions.  [name] 08/07/2013, 10:10 PM

## 2013-08-07 NOTE — ED Notes (Signed)
Patient transported to [facility] by [employer].  

## 2013-08-07 NOTE — ED Notes (Signed)
Spoke with Melinda Krause at [facility] to notify that we are waiting on IVC paperwork to be delivered.

## 2013-08-08 DIAGNOSIS — F332 Major depressive disorder, recurrent severe without psychotic features: Secondary | ICD-10-CM

## 2013-08-08 DIAGNOSIS — F411 Generalized anxiety disorder: Principal | ICD-10-CM | POA: Diagnosis present

## 2013-08-08 LAB — HEMOGLOBIN A1C
Hgb A1c MFr Bld: 5.3 % (ref <5.7)
Mean Plasma Glucose: 105 mg/dL (ref <117)

## 2013-08-08 MED ORDER — VENLAFAXINE HCL ER 75 MG PO CP24
75.0000 mg | ORAL_CAPSULE | Freq: Every day | ORAL | Status: DC
  Administered 2013-08-08 – 2013-08-09 (×2): 75 mg via ORAL
  Filled 2013-08-08 (×4): qty 1

## 2013-08-08 MED ORDER — ZOLPIDEM TARTRATE 5 MG PO TABS
5.0000 mg | ORAL_TABLET | Freq: Every evening | ORAL | Status: DC | PRN
  Administered 2013-08-08 – 2013-08-10 (×3): 5 mg via ORAL
  Filled 2013-08-08 (×3): qty 1

## 2013-08-08 MED ORDER — SELENIUM SULFIDE 1 % EX LOTN
TOPICAL_LOTION | Freq: Every day | CUTANEOUS | Status: DC
  Filled 2013-08-08: qty 207

## 2013-08-08 MED ORDER — CLOTRIMAZOLE 1 % EX CREA
TOPICAL_CREAM | Freq: Two times a day (BID) | CUTANEOUS | Status: DC
  Administered 2013-08-08 – 2013-08-10 (×4): via TOPICAL
  Filled 2013-08-08: qty 15

## 2013-08-08 MED ORDER — LOPERAMIDE HCL 2 MG PO CAPS
4.0000 mg | ORAL_CAPSULE | ORAL | Status: DC | PRN
  Administered 2013-08-08 – 2013-08-10 (×2): 4 mg via ORAL

## 2013-08-08 NOTE — Progress Notes (Signed)
Adult Psychoeducational Group Note  Date:  08/08/2013 Time:  10:49 PM  Group Topic/Focus:  Goals Group:   The focus of this group is to help patients establish daily goals to achieve during treatment and discuss how the patient can incorporate goal setting into their daily lives to aide in recovery.  Participation Level:  Active  Participation Quality:  Appropriate  Affect:  Appropriate  Cognitive:  Appropriate  Insight: Appropriate  Engagement in Group:  Engaged  Modes of Intervention:  Discussion  Additional Comments:  Pt stated that her day was ok, the counselor helped a lot, and she want to to get to a point where she can open up in group.  Terie Purser R 08/08/2013, 10:49 PM

## 2013-08-08 NOTE — Progress Notes (Signed)
Patient ID: Melinda Krause, female   DOB: December 12, 1960, 52 y.o.   MRN: [id number] D: Pt. Sitting on the side of bed, teary eyed. A: Writer introduced self to client and reviewed meds to be given and procedure(EKG) to be done. Pt. Reports understanding. Client is cooperative. R:Staff will monitor q56min for safety. Pt. Is safe on the unit.

## 2013-08-08 NOTE — [facility] Counselor (Signed)
Adult Comprehensive Assessment  Patient ID: Melinda Krause, female   DOB: [birth year], 52 y.o.   MRN: [id number]  Information Source: Information source: Patient  Current Stressors:  Educational / Learning stressors: N/A Employment / Job issues: Unemployed, tearful about not having her last job, stating that she enjoyed it and was good at it Family Relationships: mother had a stroke in June Financial / Lack of resources (include bankruptcy): N/A Housing / Lack of housing: N/A Physical health (include injuries & life threatening diseases): N/A Social relationships: N/A Substance abuse: N/A Bereavement / Loss: Father passed away in 04-09-2011  Living/Environment/Situation:  Living Arrangements: Spouse/significant other Living conditions (as described by patient or guardian): Pt states that she lives with her husband in [place].  Pt reports that this is a good environment but she is alone a lot.  How long has patient lived in current situation?: 2 years What is atmosphere in current home: Supportive;Loving;Comfortable  Family History:  Marital status: Married Number of Years Married: 30 What types of issues is patient dealing with in the relationship?: Pt reports her husband was unfaithful in the past and they separated but are doing well now.  Additional relationship information: N/A Does patient have children?: Yes How many children?: 3 How is patient's relationship with their children?: Pt reports not having a good relationship with 3 adult children due to them being indepedent and not talking to them much.    Childhood History:  By whom was/is the patient raised?: Both parents Additional childhood history information: Pt states that she had a very good childhood.  Description of patient's relationship with caregiver when they were a child: Pt states that she got along well with parents growing up. Patient's description of current relationship with people who raised him/her:  Father is deceased, mother recently had a stroke.  Pt states that she is having conflict with her mother and sister since the stroke about arrangements for their mother.  Does patient have siblings?: Yes Number of Siblings: 3 Description of patient's current relationship with siblings: Pt states that she has a strained relationship with siblings, brother is deceased.  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 completed: Oncologist in Nursing Currently a student?: No Learning disability?: No  Employment/Work Situation:   Employment situation: Unemployed Patient's job has been impacted by current illness: No What is the longest time patient has a held a job?: 3-4 years 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, sports Has patient ever been in the [employer]?: No Has patient ever served in Buyer, retail?: No  Financial Resources:   Financial resources: Income from spouse;Private insurance Does patient have a representative payee or guardian?: No  Alcohol/Substance Abuse:   What has been your use of drugs/alcohol within the last 12 months?: Pt denies alcohol and drug abuse If attempted suicide, did drugs/alcohol play a role in this?: No Alcohol/Substance Abuse Treatment Hx: Denies past history If yes, describe treatment: N/A Has alcohol/substance abuse ever caused legal problems?: No  Social Support System:   Patient's Community Support System: Good Describe Community Support System: Pt reports that her husband and church are supportive Type of faith/religion: Ephri[employer] Knuckles  How does patient's faith help  to cope with current illness?: prayer, church attendance  Leisure/Recreation:   Leisure and Hobbies: reading  Strengths/Needs:   What things does the patient do well?: pt was unable to name anything at this time In what areas does patient struggle / problems for patient: Depression, SI  Discharge Plan:   Does patient have access to transportation?: Yes Will patient be returning to same living situation after discharge?: Yes Currently receiving community mental health services: Yes (From Whom) ([facility]) If no, would patient like referral for services when discharged?: Yes (What county?) [facility] [place]) Does patient have financial barriers related to discharge medications?: No  Summary/Recommendations:     Patient is a 52 year old Caucasian Female with a diagnosis of Major Depressive Disorder.  Patient lives in [place] with husband.  Pt reports recent stressors being the loss of her career and conflict with her family.  Patient will benefit from crisis stabilization, medication evaluation, group therapy and psycho education in addition to case management for discharge planning.    Horton, Salome Arnt. 08/08/2013

## 2013-08-08 NOTE — [facility] Group Notes (Signed)
[facility] LCSW Group Therapy      Feelings About Diagnosis 1:15 - 2:30 PM         08/08/2013    Type of Therapy:  Group Therapy  Participation Level:  Active  Participation Quality:  Appropriate  Affect:  Appropriate  Cognitive:  Alert and Appropriate  Insight:  Developing/Improving and Engaged  Engagement in Therapy:  Developing/Improving and Engaged  Modes of Intervention:  Discussion, Education, Exploration, Problem-Solving, Rapport Building, Support  Summary of Progress/Problems:  Patient actively participated in group. Patient discussed past and present diagnosis and the effects it has had on  life.  Patient talked about family and society being judgmental and the stigma associated with having a mental health diagnosis.  She shared the diagnosis makes her feel weak and that she has character flaws.  Patient challenged on thinking she should be able to control her thoughts.  Wynn Banker 08/08/2013

## 2013-08-08 NOTE — Progress Notes (Signed)
Recreation Therapy Notes  Date: 09.16.2014 Time: 2:45pm Location: [address] Dayroom  Group Topic: Animal Assisted Activities (AAA)  Behavioral Response: Engaged, Appropriate  Affect: Euthymic  Clinical Observations/Feedback: Dog Team: Slate & handler. Patient interacted appropriately with peer, dog team, LRT and MHT.   Jaquana Geiger L Trumaine Wimer, LRT/CTRS  Shuntavia Yerby L 08/08/2013 4:49 PM 

## 2013-08-08 NOTE — Progress Notes (Signed)
Adult Psychoeducational Group Note  Date:  08/08/2013 Time:  11:00am  Group Topic/Focus:  Recovery Goals:   The focus of this group is to identify appropriate goals for recovery and establish a plan to achieve them.  Participation Level:  Active  Participation Quality:  Appropriate and Attentive  Affect:  Appropriate  Cognitive:  Alert and Appropriate  Insight: Appropriate  Engagement in Group:  Engaged  Modes of Intervention:  Discussion and Education  Additional Comments:  Pt attended and [particpated in group. When ask what recovery means to her pt stated healing process.  Shelly Bombard D 08/08/2013, 1:47 PM

## 2013-08-08 NOTE — Progress Notes (Signed)
Patient ID: Melinda Krause, female   DOB: 02-Jul-1961, 52 y.o.   MRN: [id number] Pt signed a 72 hour request for discharge.

## 2013-08-08 NOTE — Progress Notes (Signed)
Patient ID: Melinda Krause, female   DOB: Mar 14, 1961, 52 y.o.   MRN: [id number] D- Patient reports poor low energy and poor sleep last night.    She reports her depression is 8/10 and that her hopelessness is 9/10.  She is upset that she was involuntarily committed as she agreed to come voluntarily, but admitted that at one point she decided she did not want to stay.  A-talked with patient about her safety being a priority.  R-Patient verbalized understanding.  She says she has nausea, abdominal pain and was worried about her belongings.  Belongings checked and given to patient.  Patient given opportunity to sigh in voluntarily and she did so.

## 2013-08-08 NOTE — Progress Notes (Signed)
Patient did not attend 0900 RN group. 

## 2013-08-08 NOTE — H&P (Signed)
Psychiatric Admission Assessment Adult  Patient Identification:  Melinda Krause Date of Evaluation:  08/08/2013 Chief Complaint:  MAJOR DEPRESSIVE DISORDER History of Present Illness: Melinda Krause is an 52 y.o. White married female admitted from [facility] assessment and medical clearance from [facility] for depression, anxiety and suicidal ideation and gestures against her religious belief. She reports that this is the first time that she has been suicidal. She contacted her provider office at [facility] and informed her that she was depressed, tearful and suicidal and they recommended that she present to [facility] for an evaluation. She has multiple stressors to include financial problems, as she reports that her husband works two jobs and they are barely Restaurant manager, fast food. She also reports "we have no money". She had to seek financial assistance from her church to pay her rent this month. She has multiple chronic medical problems to include frequent diarrhea and nausea r/t abdominal pain, questionable IBS. Patient mother recently had a stroke and she had to go to [place] to help care for her and than return in month of July, since than her husband notes symptoms of depression. Patient husband works all the time and she hardly sees him. She has a recent medication change a couple months ago, reduced wellbutrin and Citalopram was added to her medication regimen. She got a knife out of her kitchen last week and poked her skin with thoughts and intentions of killing herself. She denies HI and no AVH. Patient is unable to contract for safety and inpatient treatment recommended for safety and stabilization. Patient stated that she and her husband upset about the IVC process and [facility] bringing her to [facility] last night.   Elements:  Location:  [facility] adult unit. Quality:  depression and anxiety. Severity:  suicidal thoughts and gestures. Timing:  two months. Duration:  several years. Context:  parental health  problems. Associated Signs/Synptoms: Depression Symptoms:  depressed mood, anhedonia, insomnia, psychomotor retardation, feelings of worthlessness/guilt, difficulty concentrating, hopelessness, impaired memory, recurrent thoughts of death, anxiety, loss of energy/fatigue, weight gain, decreased labido, decreased appetite, (Hypo) Manic Symptoms:  Impulsivity, Irritable Mood, Anxiety Symptoms:  Excessive Worry, Psychotic Symptoms:  none PTSD Symptoms: NA  Psychiatric Specialty Exam: Physical Exam  ROS Agree with physical examination finding completed at [facility]  Blood pressure 136/86, pulse 97, temperature 97.3 F (36.3 C), temperature source Oral, resp. rate 20, height 5\' 4"  (1.626 m), weight 125.193 kg (276 lb).Body mass index is 47.35 kg/(m^2).  General Appearance: Casual and Guarded  Eye Contact::  Fair  Speech:  Clear and Coherent, Normal Rate and Slow  Volume:  Decreased  Mood:  Anxious, Depressed, Hopeless, Irritable and Worthless  Affect:  Depressed and Flat  Thought Process:  Coherent and Goal Directed  Orientation:  Full (Time, Place, and Person)  Thought Content:  Obsessions and Rumination  Suicidal Thoughts:  Yes.  with intent/plan  Homicidal Thoughts:  No  Memory:  Immediate;   Fair  Judgement:  Impaired  Insight:  Fair  Psychomotor Activity:  Psychomotor Retardation and Restlessness  Concentration:  Fair  Recall:  Fair  Akathisia:  NA  Handed:  Left  AIMS (if indicated):     Assets:  Communication Skills Desire for Improvement Financial Resources/Insurance Housing Physical Health Resilience Social Support Transportation  Sleep:  Number of Hours: 0    Past Psychiatric History: Diagnosis: MDD and Anxiety  Hospitalizations: no  Outpatient Care: yes  Substance Abuse Care: no  Self-Mutilation:no  Suicidal Attempts: no  Violent Behaviors:no  Past Medical History:   Past Medical History  Diagnosis Date  . History of chicken pox     childhood   . Depression     Counseling-Dr [facility]  . Headache(784.0)     daily  . Peptic ulcer   . Seasonal allergies   . Migraine     1-2 per month  . Hypothyroidism   . Fibromyalgia     Rheumatologist- Dr. Ardis Hughs  . Hypertension   . Hyperlipidemia   . Pain in joint, shoulder region 04/02/2013    left   None. Allergies:   Allergies  Allergen Reactions  . Demerol     Irritability, and shortness of breath   PTA Medications: Prescriptions prior to admission  Medication Sig Dispense Refill  . ALPRAZolam (XANAX) 0.25 MG tablet Take 0.25 mg by mouth 3 (three) times daily as needed for anxiety.      . citalopram (CELEXA) 20 MG tablet Take 20 mg by mouth daily.      Marland Kitchen esomeprazole (NEXIUM) 40 MG capsule Take 40 mg by mouth daily before breakfast.        . HYDROcodone-acetaminophen (NORCO/VICODIN) 5-325 MG per tablet Take 1 tablet by mouth every 6 (six) hours as needed for pain.      Marland Kitchen levothyroxine (SYNTHROID, LEVOTHROID) 150 MCG tablet Take 1 tablet (150 mcg total) by mouth daily.  90 tablet  3  . loperamide (IMODIUM) 2 MG capsule Take 2 mg by mouth 4 (four) times daily as needed for diarrhea or loose stools.      Marland Kitchen loratadine (CLARITIN) 10 MG tablet Take 10 mg by mouth daily as needed for allergies.      . metoprolol succinate (TOPROL-XL) 50 MG 24 hr tablet Take 75 mg by mouth daily. Take with or immediately following a meal.      . Multiple Vitamin (MULTIVITAMIN WITH MINERALS) TABS tablet Take 1 tablet by mouth daily.      . promethazine (PHENERGAN) 25 MG tablet Take 12.5-25 mg by mouth every 6 (six) hours as needed for nausea.      Marland Kitchen zolpidem (AMBIEN CR) 12.5 MG CR tablet Take 12.5 mg by mouth at bedtime.      Marland Kitchen buPROPion (WELLBUTRIN XL) 300 MG 24 hr tablet Take 1 tablet (300 mg total) by mouth daily.      . [DISCONTINUED] hydrochlorothiazide (HYDRODIURIL) 25 MG tablet Take 25 mg by mouth daily as needed (for hypertension/edema).        Previous Psychotropic  Medications:  Medication/Dose  Effexor  wellbutrin  lexapro  celexa  zoloft  cymbalata  trazodone   Substance Abuse History in the last 12 months:  no  Consequences of Substance Abuse: NA  Social History:  reports that she has never smoked. She has never used smokeless tobacco. She reports that she does not drink alcohol or use illicit drugs. Additional Social History: Over the Counter:  (Claritin, Imodium) History of alcohol / drug use?: No history of alcohol / drug abuse   Current Place of Residence:   Place of Birth:   Family Members: Marital Status:  Married Children:  Sons:  Daughters: Relationships: Education:  Corporate treasurer Problems/Performance: Religious Beliefs/Practices: History of Abuse (Emotional/Phsycial/Sexual) Occupational Experiences; Military History:  None. Legal History: Hobbies/Interests:  Family History:   Family History  Problem Relation Age of Onset  . Emphysema Mother   . Allergies Mother   . Asthma Mother   . Heart disease Father   . Rheum arthritis Maternal Grandfather   .  Cancer Mother   . Cancer Maternal Grandmother   . Heart disease Mother   . Heart disease Other     grandparents    Results for orders placed during the hospital encounter of 08/07/13 (from the past 72 hour(s))  URINE RAPID DRUG SCREEN (HOSP PERFORMED)     Status: Abnormal   Collection Time    08/07/13  5:33 PM      Result Value Range   Opiates POSITIVE (*) NONE DETECTED   Cocaine NONE DETECTED  NONE DETECTED   Benzodiazepines NONE DETECTED  NONE DETECTED   Amphetamines NONE DETECTED  NONE DETECTED   Tetrahydrocannabinol NONE DETECTED  NONE DETECTED   Barbiturates NONE DETECTED  NONE DETECTED   Comment:            DRUG SCREEN FOR MEDICAL PURPOSES     ONLY.  IF CONFIRMATION IS NEEDED     FOR ANY PURPOSE, NOTIFY LAB     WITHIN 5 DAYS.                LOWEST DETECTABLE LIMITS     FOR URINE DRUG SCREEN     Drug Class       Cutoff (ng/mL)      Amphetamine      1000     Barbiturate      200     Benzodiazepine   200     Tricyclics       300     Opiates          300     Cocaine          300     THC              50  ACETAMINOPHEN LEVEL     Status: None   Collection Time    08/07/13  5:50 PM      Result Value Range   Acetaminophen (Tylenol), Serum <15.0  10 - 30 ug/mL   Comment:            THERAPEUTIC CONCENTRATIONS VARY     SIGNIFICANTLY. A RANGE OF 10-30     ug/mL MAY BE AN EFFECTIVE     CONCENTRATION FOR MANY PATIENTS.     HOWEVER, SOME ARE BEST TREATED     AT CONCENTRATIONS OUTSIDE THIS     RANGE.     ACETAMINOPHEN CONCENTRATIONS     >150 ug/mL AT 4 HOURS AFTER     INGESTION AND >50 ug/mL AT 12     HOURS AFTER INGESTION ARE     OFTEN ASSOCIATED WITH TOXIC     REACTIONS.  CBC     Status: Abnormal   Collection Time    08/07/13  5:50 PM      Result Value Range   WBC 9.7  4.0 - 10.5 K/uL   RBC 5.68 (*) 3.87 - 5.11 MIL/uL   Hemoglobin 13.6  12.0 - 15.0 g/dL   HCT 33.2  95.1 - 88.4 %   MCV 74.6 (*) 78.0 - 100.0 fL   MCH 23.9 (*) 26.0 - 34.0 pg   MCHC 32.1  30.0 - 36.0 g/dL   RDW 16.6  06.3 - 01.6 %   Platelets 542 (*) 150 - 400 K/uL  COMPREHENSIVE METABOLIC PANEL     Status: Abnormal   Collection Time    08/07/13  5:50 PM      Result Value Range   Sodium 139  135 - 145 mEq/L   Potassium 4.1  3.5 - 5.1 mEq/L   Chloride 101  96 - 112 mEq/L   CO2 23  19 - 32 mEq/L   Glucose, Bld 120 (*) 70 - 99 mg/dL   BUN 6  6 - 23 mg/dL   Creatinine, Ser 8.29  0.50 - 1.10 mg/dL   Calcium 56.2  8.4 - 13.0 mg/dL   Total Protein 7.6  6.0 - 8.3 g/dL   Albumin 4.2  3.5 - 5.2 g/dL   AST 45 (*) 0 - 37 U/L   ALT 39 (*) 0 - 35 U/L   Alkaline Phosphatase 101  39 - 117 U/L   Total Bilirubin 1.3 (*) 0.3 - 1.2 mg/dL   GFR calc non Af Amer 80 (*) >90 mL/min   GFR calc Af Amer >90  >90 mL/min   Comment: (NOTE)     The eGFR has been calculated using the CKD EPI equation.     This calculation has not been validated in all clinical  situations.     eGFR's persistently <90 mL/min signify possible Chronic Kidney     Disease.  ETHANOL     Status: None   Collection Time    08/07/13  5:50 PM      Result Value Range   Alcohol, Ethyl (B) <11  0 - 11 mg/dL   Comment:            LOWEST DETECTABLE LIMIT FOR     SERUM ALCOHOL IS 11 mg/dL     FOR MEDICAL PURPOSES ONLY  SALICYLATE LEVEL     Status: Abnormal   Collection Time    08/07/13  5:50 PM      Result Value Range   Salicylate Lvl <2.0 (*) 2.8 - 20.0 mg/dL   Psychological Evaluations:  Assessment:   DSM5:  Schizophrenia Disorders:   Obsessive-Compulsive Disorders:   Trauma-Stressor Disorders:   Substance/Addictive Disorders:   Depressive Disorders:  Major Depressive Disorder - Severe (296.23)  AXIS I:  Generalized Anxiety Disorder and Major Depression, Recurrent severe AXIS II:  Deferred AXIS III:   Past Medical History  Diagnosis Date  . History of chicken pox     childhood  . Depression     Counseling-Dr [facility]  . Headache(784.0)     daily  . Peptic ulcer   . Seasonal allergies   . Migraine     1-2 per month  . Hypothyroidism   . Fibromyalgia     Rheumatologist- Dr. Ardis Hughs  . Hypertension   . Hyperlipidemia   . Pain in joint, shoulder region 04/02/2013    left   AXIS IV:  occupational problems, other psychosocial or environmental problems and problems related to social environment AXIS V:  41-50 serious symptoms  Treatment Plan/Recommendations:  Admit for depression, anxiety and suicidal ideations.  Treatment Plan Summary: Daily contact with patient to assess and evaluate symptoms and progress in treatment Medication management Current Medications:  Current Facility-Administered Medications  Medication Dose Route Frequency Provider Last Rate Last Dose  . acetaminophen (TYLENOL) tablet 650 mg  650 mg Oral Q6H PRN Verne Spurr, PA-C   650 mg at 08/08/13 0113  . alum & mag hydroxide-simeth (MAALOX/MYLANTA)  200-200-20 MG/5ML suspension 30 mL  30 mL Oral Q4H PRN Verne Spurr, PA-C   30 mL at 08/08/13 0700  . cyclobenzaprine (FLEXERIL) tablet 5 mg  5 mg Oral TID PRN Audrea Muscat, NP   5 mg at 08/08/13 0817  . levothyroxine (SYNTHROID, LEVOTHROID) tablet 150 mcg  150 mcg Oral Daily Audrea Muscat, NP   150 mcg at 08/08/13 0454  . loratadine (CLARITIN) tablet 10 mg  10 mg Oral Daily PRN Evanna Janann August, NP      . magnesium hydroxide (MILK OF MAGNESIA) suspension 30 mL  30 mL Oral Daily PRN Verne Spurr, PA-C      . metoprolol succinate (TOPROL-XL) 24 hr tablet 75 mg  75 mg Oral Daily Evanna Cori Merry Proud, NP   75 mg at 08/08/13 0814  . multivitamin with minerals tablet 1 tablet  1 tablet Oral Daily Evanna Janann August, NP   1 tablet at 08/08/13 0815  . pantoprazole (PROTONIX) EC tablet 40 mg  40 mg Oral Daily Evanna Cori Merry Proud, NP   40 mg at 08/08/13 0815  . zolpidem (AMBIEN) tablet 5 mg  5 mg Oral QHS PRN Audrea Muscat, NP   5 mg at 08/07/13 2350    Observation Level/Precautions:  15 minute checks  Laboratory:  reviewed admission labs  Psychotherapy:  Group and milieu  Medications:  Effexor XR 75 mg Daily and may titrate as needed clinically and discontinue Celexa due to not helpful.  Consultations: none  Discharge Concerns:  safety  Estimated LOS: 4-7 days  Other:  Patient agree to sign voluntary consent for treatment.   I certify that inpatient services furnished can reasonably be expected to improve the patient's condition.   Nehemiah Settle., MD 9/16/20149:38 AM

## 2013-08-08 NOTE — [facility] Suicide Risk Assessment (Signed)
Suicide Risk Assessment  Admission Assessment     Nursing information obtained from:  Patient Demographic factors:  Caucasian;Unemployed Current Mental Status:  NA Loss Factors:  Loss of significant relationship;Financial problems / change in socioeconomic status (Mom had stroke 2023/05/19; dad died 2011/04/14) Historical Factors:  NA Risk Reduction Factors:  Sense of responsibility to family;Religious beliefs about death;Living with another person, especially a relative;Positive coping skills or problem solving skills  CLINICAL FACTORS:   Severe Anxiety and/or Agitation Depression:   Anhedonia Hopelessness Impulsivity Insomnia Recent sense of peace/wellbeing Severe Chronic Pain More than one psychiatric diagnosis Previous Psychiatric Diagnoses and Treatments Medical Diagnoses and Treatments/Surgeries  COGNITIVE FEATURES THAT CONTRIBUTE TO RISK:  Closed-mindedness Loss of executive function Polarized thinking Thought constriction (tunnel vision)    SUICIDE RISK:   Moderate:  Frequent suicidal ideation with limited intensity, and duration, some specificity in terms of plans, no associated intent, good self-control, limited dysphoria/symptomatology, some risk factors present, and identifiable protective factors, including available and accessible social support.  PLAN OF CARE: patient admitted from Assessment and medical clearance from [facility] for depression, anxiety and suicidal thoughts and gesture.   I certify that inpatient services furnished can reasonably be expected to improve the patient's condition.  Ladanian Kelter,JANARDHAHA R. 08/08/2013, 9:35 AM

## 2013-08-09 LAB — LIPID PANEL
Cholesterol: 172 mg/dL (ref 0–200)
HDL: 31 mg/dL — ABNORMAL LOW (ref >39)
LDL Cholesterol: 98 mg/dL (ref 0–99)
Total CHOL/HDL Ratio: 5.5 RATIO
Triglycerides: 215 mg/dL — ABNORMAL HIGH (ref <150)
VLDL: 43 mg/dL — ABNORMAL HIGH (ref 0–40)

## 2013-08-09 LAB — HEMOGLOBIN A1C
Hgb A1c MFr Bld: 5.5 % (ref <5.7)
Mean Plasma Glucose: 111 mg/dL (ref <117)

## 2013-08-09 LAB — T4, FREE: Free T4: 1.52 ng/dL (ref 0.80–1.80)

## 2013-08-09 LAB — TSH: TSH: 0.159 u[IU]/mL — ABNORMAL LOW (ref 0.350–4.500)

## 2013-08-09 MED ORDER — VENLAFAXINE HCL ER 150 MG PO CP24
150.0000 mg | ORAL_CAPSULE | Freq: Every day | ORAL | Status: DC
  Administered 2013-08-10 – 2013-08-11 (×2): 150 mg via ORAL
  Filled 2013-08-09 (×3): qty 1

## 2013-08-09 MED ORDER — HYDROXYZINE HCL 50 MG PO TABS
50.0000 mg | ORAL_TABLET | Freq: Three times a day (TID) | ORAL | Status: DC
  Administered 2013-08-09 – 2013-08-11 (×7): 50 mg via ORAL
  Filled 2013-08-09 (×8): qty 1

## 2013-08-09 MED ORDER — LEVOTHYROXINE SODIUM 200 MCG PO TABS
200.0000 ug | ORAL_TABLET | Freq: Every day | ORAL | Status: DC
  Administered 2013-08-10 – 2013-08-11 (×2): 200 ug via ORAL
  Filled 2013-08-09 (×3): qty 1

## 2013-08-09 MED ORDER — CLONAZEPAM 0.5 MG PO TABS
0.5000 mg | ORAL_TABLET | Freq: Two times a day (BID) | ORAL | Status: DC
  Administered 2013-08-09 – 2013-08-11 (×4): 0.5 mg via ORAL
  Filled 2013-08-09 (×4): qty 1

## 2013-08-09 NOTE — [facility] Suicide Risk Assessment (Signed)
[facility] INPATIENT:  Family/Significant Other Suicide Prevention Education  Suicide Prevention Education:  Education Completed; Melinda Krause - husband [phone]),  (name of family member/significant other) has been identified by the patient as the family member/significant other with whom the patient will be residing, and identified as the person(s) who will aid the patient in the event of a mental health crisis (suicidal ideations/suicide attempt).  With written consent from the patient, the family member/significant other has been provided the following suicide prevention education, prior to the and/or following the discharge of the patient.  The suicide prevention education provided includes the following:  Suicide risk factors  Suicide prevention and interventions  National Suicide Hotline telephone number  [facility] assessment telephone number  [facility] Emergency Assistance 911  [facility] and/or Residential Mobile Crisis Unit telephone number  Request made of family/significant other to:  Remove weapons (e.g., guns, rifles, knives), all items previously/currently identified as safety concern.    Remove drugs/medications (over-the-counter, prescriptions, illicit drugs), all items previously/currently identified as a safety concern.  The family member/significant other verbalizes understanding of the suicide prevention education information provided.  The family member/significant other agrees to remove the items of safety concern listed above.  Melinda Krause 08/09/2013, 2:56 PM

## 2013-08-09 NOTE — [facility] Group Notes (Signed)
[facility] LCSW Group Therapy  08/09/2013  1:15 PM   Type of Therapy:  Group Therapy  Participation Level:  Active  Participation Quality:  Appropriate and Attentive  Affect:  Appropriate, Flat and Depressed  Cognitive:  Alert and Appropriate  Insight:  Developing/Improving and Engaged  Engagement in Therapy:  Developing/Improving and Engaged  Modes of Intervention:  Clarification, Confrontation, Discussion, Education, Exploration, Limit-setting, Orientation, Problem-solving, Rapport Building, Dance movement psychotherapist, Socialization and Support  Summary of Progress/Problems: The topic for group today was emotional regulation.  This group focused on both positive and negative emotion identification and allowed group members to process ways to identify feelings, regulate negative emotions, and find healthy ways to manage internal/external emotions. Group members were asked to reflect on a time when their reaction to an emotion led to a negative outcome and explored how alternative responses using emotion regulation would have benefited them. Group members were also asked to discuss a time when emotion regulation was utilized when a negative emotion was experienced.  Pt shared that she often holds her emotions in instead of expressing her thoughts and feelings.  Pt states that she lashes out on both her husband and herself.  Pt actively participated and was engaged in group discussion.    Reyes Ivan, LCSWA 08/09/2013 2:19 PM

## 2013-08-09 NOTE — Progress Notes (Signed)
Adult Psychoeducational Group Note  Date:  08/09/2013 Time:  10:00 11:00 Group Topic/Focus:  Therapeutic Activity and Personal Development Group  Participation Level:  Active  Participation Quality:  Appropriate and Attentive  Affect:  Appropriate  Cognitive:  Alert and Appropriate  Insight: Appropriate  Engagement in Group:  Engaged  Modes of Intervention:  Discussion and Education  Additional Comments:  Pt attended and participated in group. We discussed personal development and what triggers she suffered from and what she could do to control her triggers.  Shelly Bombard D 08/09/2013, 11:24 AM

## 2013-08-09 NOTE — Progress Notes (Signed)
Adult Psychoeducational Group Note  Date:  08/09/2013 Time:  9:01 PM  Group Topic/Focus:  Wrap-Up Group:   The focus of this group is to help patients review their daily goal of treatment and discuss progress on daily workbooks.  Participation Level:  Active  Participation Quality:  Appropriate  Affect:  Appropriate  Cognitive:  Appropriate  Insight: Appropriate  Engagement in Group:  Engaged  Modes of Intervention:  Support  Additional Comments:  Pt stated that one good thing that happened today was that she was able to use her coping skills of saying her abc's and then her name to counter her depression. She says it helped greatly and that she felt much better today because of it.   Toinette Lackie 08/09/2013, 9:01 PM

## 2013-08-09 NOTE — Progress Notes (Signed)
Pt has been observed sitting in the dayroom watching TV and interacting with her peers.  Pt reports she feels she is making a little progress.  She is hopeful that she can get some sleep tonight as she has had very little sleep in the past three days.  Pt reports that the medications are helping her to relax a little so that her pain level is not as intense.  Pt denies SI/HI/AV.  Pt makes her needs known to staff.  She is pleasant/cooperative with staff.  Pt voices no other needs/concerns at this time.  Support and encouragement offered.  Safety maintained with q15 minute checks.

## 2013-08-09 NOTE — [facility] Group Notes (Signed)
[facility] LCSW Aftercare Discharge Planning Group Note   08/09/2013  8:45 AM  Participation Quality:  Alert and Appropriate   Mood/Affect:  Appropriate, Flat and Depressed  Depression Rating:  6  Anxiety Rating:  5  Thoughts of Suicide:  Pt denies SI/HI  Will you contract for safety?   Yes  Current AVH:  Pt denies  Plan for Discharge/Comments:  Pt attended discharge planning group and actively participated in group.  CSW provided pt with today's workbook.  Pt states that she is dealing with a lot of physical issues today.  Pt will return home in [place] and will follow up at [facility] for medication management and therapy; CSW scheduled pt's follow up appointment.    Transportation Means: Pt reports access to transportation  Supports: No supports mentioned at this time  Reyes Ivan, LCSWA 08/09/2013 9:43 AM

## 2013-08-09 NOTE — Progress Notes (Signed)
D: Pt denies SI/HI/AVH. Pt rates depression 7/10 and hopeless 5/10. Pt c/o back pain. Pt requesting stronger pain med to help relieve pain. Pt reports poor sleep last night. Pt reports sleeping 2 hours last night.  Pt also requested to have imodium this morning. Writer informed pt that she needs to save her BM's for writer to observe stool frequency, color and amount. A: Medications administered as ordered per MD. Verbal support given. Pt encouraged to attend groups. MD and PA made aware of pt complaints. 15 minute checks performed for safety. R:Pt has minimal interaction on the unit. Pt stated that she is concerned about her "extreme financial problems". Pt is compliant with treatment.

## 2013-08-09 NOTE — Progress Notes (Signed)
Pt reports she is doing better, but is frustrated that her home medications have not been reordered.  She is asking for her Vicodin and Ambien CR at night.  She says the 5 mg Ambien does not work.  Previous nurse reported that pt's meds were verified, but the PA was not able to address them before she left for the day d/t a situation on the unit.  Assured pt that her meds would be addressed tomorrow and that she could have heat packs for comfort as needed, along with Tylenol and Flexeril as ordered.  Pt denies SI/HI/AV at this time.  Pt makes her needs known to staff.  Support and encouragement offered.  Safety maintained with q15 minute checks.

## 2013-08-09 NOTE — Tx Team (Signed)
Interdisciplinary Treatment Plan Update (Adult)  Date: 08/09/2013  Time Reviewed:  9:45 AM  Progress in Treatment: Attending groups: Yes Participating in groups:  Yes Taking medication as prescribed:  Yes Tolerating medication:  Yes Family/Significant othe contact made: Yes Patient understands diagnosis:  Yes Discussing patient identified problems/goals with staff:  Yes Medical problems stabilized or resolved:  Yes Denies suicidal/homicidal ideation: Yes Issues/concerns per patient self-inventory:  Yes Other:  New problem(s) identified: N/A  Discharge Plan or Barriers: Pt will follow up at [facility] for medication management and therapy.    Reason for Continuation of Hospitalization: Anxiety Depression Medication Stabilization  Comments: N/A  Estimated length of stay: 2 days, possibly d/c Friday  For review of initial/current patient goals, please see plan of care.  Attendees: Patient:     Family:     Physician:  Dr. Javier Glazier 08/09/2013 10:18 AM   Nursing:   Burnetta Sabin, RN 08/09/2013 10:18 AM   Clinical Social Worker:  Reyes Ivan, LCSWA 08/09/2013 10:18 AM   Other: Verne Spurr, PA 08/09/2013 10:18 AM   Other:  Frankey Shown, MA care coordination 08/09/2013 10:18 AM   Other:  Juline Patch, LCSW 08/09/2013 10:18 AM   Other:  Nestor Ramp, RN 08/09/2013 10:18 AM   Other: Onnie Boer, RN case manager 08/09/2013 10:18 AM   Other:    Other:    Other:    Other:    Other:     Scribe for Treatment Team:   Carmina Miller, 08/09/2013 10:18 AM

## 2013-08-09 NOTE — Progress Notes (Signed)
[facility] MD Progress Note  08/09/2013 2:51 PM Melinda Krause  MRN:  [id number] Subjective:  Patient has been anxious, insomnia and frustrated about not getting her sleeping medication from home. She has been tolerating her medication Effexor without adverse effects. She has been trying hard to attend groups today. She has been tired and feels weak today. She is in agreement with medication changes for better mood and anxiety.  Diagnosis:   DSM5: Schizophrenia Disorders:   Obsessive-Compulsive Disorders:   Trauma-Stressor Disorders:   Substance/Addictive Disorders:   Depressive Disorders:  Major Depressive Disorder - Severe (296.23)  Axis I: Generalized Anxiety Disorder and Major Depression, Recurrent severe  ADL's:  Impaired  Sleep: Poor  Appetite:  Fair  Suicidal Ideation:  Patient endorses suicidal ideation but contracts for safety in the hospital Homicidal Ideation:  Denied AEB (as evidenced by):  Psychiatric Specialty Exam: ROS  Blood pressure 144/84, pulse 91, temperature 97.5 F (36.4 C), temperature source Oral, resp. rate 16, height 5\' 4"  (1.626 m), weight 125.193 kg (276 lb).Body mass index is 47.35 kg/(m^2).  General Appearance: Casual, Fairly Groomed and Neat  Eye Contact::  Minimal  Speech:  Clear and Coherent, Normal Rate and Slow  Volume:  Decreased  Mood:  Anxious, Depressed, Hopeless and Worthless  Affect:  Depressed and Flat  Thought Process:  Goal Directed and Intact  Orientation:  Full (Time, Place, and Person)  Thought Content:  Rumination  Suicidal Thoughts:  Yes.  without intent/plan  Homicidal Thoughts:  No  Memory:  Immediate;   Fair  Judgement:  Intact  Insight:  Fair  Psychomotor Activity:  Psychomotor Retardation and Restlessness  Concentration:  Fair  Recall:  Fair  Akathisia:  NA  Handed:  Right  AIMS (if indicated):     Assets:  Communication Skills Desire for Improvement Housing Physical Health Resilience Social  Support Transportation  Sleep:  Number of Hours: 0   Current Medications: Current Facility-Administered Medications  Medication Dose Route Frequency Provider Last Rate Last Dose  . acetaminophen (TYLENOL) tablet 650 mg  650 mg Oral Q6H PRN Verne Spurr, PA-C   650 mg at 08/08/13 2127  . alum & mag hydroxide-simeth (MAALOX/MYLANTA) 200-200-20 MG/5ML suspension 30 mL  30 mL Oral Q4H PRN Verne Spurr, PA-C   30 mL at 08/09/13 0003  . clotrimazole (LOTRIMIN) 1 % cream   Topical BID Verne Spurr, PA-C      . cyclobenzaprine (FLEXERIL) tablet 5 mg  5 mg Oral TID PRN Audrea Muscat, NP   5 mg at 08/09/13 0745  . hydrOXYzine (ATARAX/VISTARIL) tablet 50 mg  50 mg Oral TID Nehemiah Settle, MD      . levothyroxine (SYNTHROID, LEVOTHROID) tablet 150 mcg  150 mcg Oral Daily Evanna Janann August, NP   150 mcg at 08/09/13 0741  . loperamide (IMODIUM) capsule 4 mg  4 mg Oral PRN Verne Spurr, PA-C   4 mg at 08/08/13 1601  . loratadine (CLARITIN) tablet 10 mg  10 mg Oral Daily PRN Evanna Janann August, NP      . magnesium hydroxide (MILK OF MAGNESIA) suspension 30 mL  30 mL Oral Daily PRN Verne Spurr, PA-C      . metoprolol succinate (TOPROL-XL) 24 hr tablet 75 mg  75 mg Oral Daily Evanna Cori Merry Proud, NP   75 mg at 08/09/13 0741  . multivitamin with minerals tablet 1 tablet  1 tablet Oral Daily Evanna Janann August, NP   1 tablet at 08/09/13 0743  . pantoprazole (  PROTONIX) EC tablet 40 mg  40 mg Oral Daily Evanna Cori Merry Proud, NP   40 mg at 08/09/13 0741  . selenium sulfide (SELSUN) 1 % shampoo   Topical Daily Verne Spurr, PA-C      . [START ON 08/10/2013] venlafaxine XR (EFFEXOR-XR) 24 hr capsule 150 mg  150 mg Oral Q breakfast Nehemiah Settle, MD      . zolpidem (AMBIEN) tablet 5 mg  5 mg Oral QHS PRN Nehemiah Settle, MD   5 mg at 08/08/13 2127    Lab Results:  Results for orders placed during the hospital encounter of 08/07/13 (from the past 48 hour(s))  HEMOGLOBIN  A1C     Status: None   Collection Time    08/08/13  6:05 AM      Result Value Range   Hemoglobin A1C 5.3  <5.7 %   Comment: (NOTE)                                                                               According to the ADA Clinical Practice Recommendations for 2011, when     HbA1c is used as a screening test:      >=6.5%   Diagnostic of Diabetes Mellitus               (if abnormal result is confirmed)     5.7-6.4%   Increased risk of developing Diabetes Mellitus     References:Diagnosis and Classification of Diabetes Mellitus,Diabetes     Care,2011,34(Suppl 1):S62-S69 and Standards of Medical Care in             Diabetes - 2011,Diabetes Care,2011,34 (Suppl 1):S11-S61.   Mean Plasma Glucose 105  <117 mg/dL   Comment: Performed at [facility]  HEMOGLOBIN A1C     Status: None   Collection Time    08/08/13  7:16 PM      Result Value Range   Hemoglobin A1C 5.5  <5.7 %   Comment: (NOTE)                                                                               According to the ADA Clinical Practice Recommendations for 2011, when     HbA1c is used as a screening test:      >=6.5%   Diagnostic of Diabetes Mellitus               (if abnormal result is confirmed)     5.7-6.4%   Increased risk of developing Diabetes Mellitus     References:Diagnosis and Classification of Diabetes Mellitus,Diabetes     Care,2011,34(Suppl 1):S62-S69 and Standards of Medical Care in             Diabetes - 2011,Diabetes Care,2011,34 (Suppl 1):S11-S61.   Mean Plasma Glucose 111  <117 mg/dL   Comment: Performed at [facility]  LIPID PANEL  Status: Abnormal   Collection Time    08/09/13  6:10 AM      Result Value Range   Cholesterol 172  0 - 200 mg/dL   Triglycerides 829 (*) <150 mg/dL   HDL 31 (*) >56 mg/dL   Total CHOL/HDL Ratio 5.5     VLDL 43 (*) 0 - 40 mg/dL   LDL Cholesterol 98  0 - 99 mg/dL   Comment:            Total Cholesterol/HDL:CHD Risk     Coronary Heart  Disease Risk Table                         Men   Women      1/2 Average Risk   3.4   3.3      Average Risk       5.0   4.4      2 X Average Risk   9.6   7.1      3 X Average Risk  23.4   11.0                Use the calculated Patient Ratio     above and the CHD Risk Table     to determine the patient's CHD Risk.                ATP III CLASSIFICATION (LDL):      <100     mg/dL   Optimal      213-086  mg/dL   Near or Above                        Optimal      130-159  mg/dL   Borderline      578-469  mg/dL   High      >629     mg/dL   Very High     Performed at [facility]  TSH     Status: Abnormal   Collection Time    08/09/13  6:10 AM      Result Value Range   TSH 0.159 (*) 0.350 - 4.500 uIU/mL   Comment: Performed at [facility]  T4, FREE     Status: None   Collection Time    08/09/13  6:10 AM      Result Value Range   Free T4 1.52  0.80 - 1.80 ng/dL   Comment: Performed at [facility]    Physical Findings: AIMS: Facial and Oral Movements Muscles of Facial Expression: None, normal Lips and Perioral Area: None, normal Jaw: None, normal Tongue: None, normal,Extremity Movements Upper (arms, wrists, hands, fingers): None, normal Lower (legs, knees, ankles, toes): None, normal, Trunk Movements Neck, shoulders, hips: None, normal, Overall Severity Severity of abnormal movements (highest score from questions above): None, normal Incapacitation due to abnormal movements: None, normal Patient's awareness of abnormal movements (rate only patient's report): No Awareness, Dental Status Current problems with teeth and/or dentures?: No Does patient usually wear dentures?: No  CIWA:    COWS:     Treatment Plan Summary: Daily contact with patient to assess and evaluate symptoms and progress in treatment Medication management  Plan: Patient labs reviewed and are TSH low at 0.159 and her free T4 is 1.42 which is normal range Increase Synthroid 200 mcg  QD We'll increase her Effexor XR 150 milligrams  Start Klonopin 0.5 mg BID Start hydroxyzine 50 mg  3 times a day for anxiety and some insomnia  Medical Decision Making Problem Points:  Established problem, worsening (2), New problem, with no additional work-up planned (3), Review of last therapy session (1) and Review of psycho-social stressors (1) Data Points:  Review or order clinical lab tests (1) Review or order medicine tests (1) Review of medication regiment & side effects (2) Review of new medications or change in dosage (2)  I certify that inpatient services furnished can reasonably be expected to improve the patient's condition.   Rodolphe Edmonston,JANARDHAHA R. 08/09/2013, 2:51 PM

## 2013-08-09 NOTE — Progress Notes (Signed)
Recreation Therapy Notes  Date: 09.17.2014 Time: 3:00pm Location: [address] Dayroom  Group Topic: Communication, Team Building, Problem Solving  Goal Area(s) Addresses:  Patient will effectively work with peer towards shared goal.  Patient will identify skill used to make activity successful.  Patient will identify how skills used during activity can be used to reach post d/c goals.   Behavioral Response: Did not attend   Jearl Klinefelter, LRT/CTRS  Jearl Klinefelter 08/09/2013 4:58 PM

## 2013-08-10 NOTE — [facility] Group Notes (Signed)
[facility] Group Notes:  (Nursing/MHT/Case Management/Adjunct)  Date:  08/10/2013  Time:  9:34 AM  Type of Therapy:  Wellness/Rules Group  Participation Level:  Minimal  Participation Quality:  Appropriate  Affect:  Appropriate  Cognitive:  Alert and Appropriate  Insight:  Good  Engagement in Group:  Supportive  Modes of Intervention:  Discussion, Orientation and Support  Summary of Progress/Problems: Pt was attentive and engaged during group.  Alfonse Spruce 08/10/2013, 9:34 AM

## 2013-08-10 NOTE — [facility] Group Notes (Signed)
[facility] LCSW Group Therapy  08/10/2013  1:15 PM   Type of Therapy:  Group Therapy  Participation Level:  Active  Participation Quality:  Appropriate and Attentive  Affect:  Appropriate, Flat  Cognitive:  Alert and Appropriate  Insight:  Developing/Improving and Engaged  Engagement in Therapy:  Developing/Improving and Engaged  Modes of Intervention:  Clarification, Confrontation, Discussion, Education, Exploration, Limit-setting, Orientation, Problem-solving, Rapport Building, Dance movement psychotherapist, Socialization and Support  Summary of Progress/Problems: The topic for group was balance in life.  Today's group focused on defining balance in one's own words, identifying things that can knock one off balance, and exploring healthy ways to maintain balance in life. Group members were asked to provide an example of a time when they felt off balance, describe how they handled that situation,and process healthier ways to regain balance in the future. Group members were asked to share the most important tool for maintaining balance that they learned while at [facility] and how they plan to apply this method after discharge.  Pt shared that she knows she is off balance when she isolates and loses perspective on reality.  Pt was able to identify when she began to isolate, which led to this depressive state.  Pt actively participated and was engaged in group discussion.     Reyes Ivan, LCSWA 08/10/2013 2:54 PM

## 2013-08-10 NOTE — Progress Notes (Signed)
Patient ID: Melinda Krause, female   DOB: September 29, 1961, 52 y.o.   MRN: [id number] [facility] MD Progress Note  08/10/2013 3:49 PM Melinda Krause  MRN:  [id number]  Subjective:  Patient states she is feeling better with her sleep as the klonopin and vistaril followed by the Ambien have helped her sleep better. States it's the first full night of sleep she has had in months. Reports she is attending groups and they are helping, also reports her anxiety is much decreased.She anticipates returning home tomorrow and has plans [facility]  Diagnosis:   DSM5: Schizophrenia Disorders:   Obsessive-Compulsive Disorders:   Trauma-Stressor Disorders:   Substance/Addictive Disorders:   Depressive Disorders:  Major Depressive Disorder - Severe (296.23)  Axis I: Generalized Anxiety Disorder and Major Depression, Recurrent severe  ADL's:  Impaired  Sleep: Poor  Appetite:  Fair  Suicidal Ideation:  Patient endorses suicidal ideation but contracts for safety in the hospital Homicidal Ideation:  Denied AEB (as evidenced by):  Psychiatric Specialty Exam: Review of Systems  Constitutional: Negative.  Negative for fever, chills, weight loss, malaise/fatigue and diaphoresis.  HENT: Negative for congestion and sore throat.   Eyes: Negative for blurred vision, double vision and photophobia.  Respiratory: Negative for cough, shortness of breath and wheezing.   Cardiovascular: Negative for chest pain, palpitations and PND.  Gastrointestinal: Negative for heartburn, nausea, vomiting, abdominal pain, diarrhea and constipation.  Musculoskeletal: Negative for myalgias, joint pain and falls.  Neurological: Negative for dizziness, tingling, tremors, sensory change, speech change, focal weakness, seizures, loss of consciousness, weakness and headaches.  Endo/Heme/Allergies: Negative for polydipsia. Does not bruise/bleed easily.  Psychiatric/Behavioral: Negative for depression,  suicidal ideas, hallucinations, memory loss and substance abuse. The patient is not nervous/anxious and does not have insomnia.     Blood pressure 114/77, pulse 88, temperature 97.5 F (36.4 C), temperature source Oral, resp. rate 20, height 5\' 4"  (1.626 m), weight 125.193 kg (276 lb).Body mass index is 47.35 kg/(m^2).  General Appearance: Casual, Fairly Groomed and Neat  Eye Contact::  Minimal  Speech:  Clear and Coherent, Normal Rate and Slow  Volume:  Decreased  Mood:  Anxious, Depressed, Hopeless and Worthless  Affect:  Depressed and Flat  Thought Process:  Goal Directed and Intact  Orientation:  Full (Time, Place, and Person)  Thought Content:  Rumination  Suicidal Thoughts:  Yes.  without intent/plan  Homicidal Thoughts:  No  Memory:  Immediate;   Fair  Judgement:  Intact  Insight:  Fair  Psychomotor Activity:  Psychomotor Retardation and Restlessness  Concentration:  Fair  Recall:  Fair  Akathisia:  NA  Handed:  Right  AIMS (if indicated):     Assets:  Communication Skills Desire for Improvement Housing Physical Health Resilience Social Support Transportation  Sleep:  Number of Hours: 6.25   Current Medications: Current Facility-Administered Medications  Medication Dose Route Frequency Provider Last Rate Last Dose  . acetaminophen (TYLENOL) tablet 650 mg  650 mg Oral Q6H PRN Verne Spurr, PA-C   650 mg at 08/10/13 0746  . alum & mag hydroxide-simeth (MAALOX/MYLANTA) 200-200-20 MG/5ML suspension 30 mL  30 mL Oral Q4H PRN Verne Spurr, PA-C   30 mL at 08/09/13 0003  . clonazePAM (KLONOPIN) tablet 0.5 mg  0.5 mg Oral BID Nehemiah Settle, MD   0.5 mg at 08/10/13 0744  . clotrimazole (LOTRIMIN) 1 % cream   Topical BID Verne Spurr, PA-C      . cyclobenzaprine (FLEXERIL)  tablet 5 mg  5 mg Oral TID PRN Audrea Muscat, NP   5 mg at 08/10/13 0747  . hydrOXYzine (ATARAX/VISTARIL) tablet 50 mg  50 mg Oral TID Nehemiah Settle, MD   50 mg at 08/10/13  1151  . levothyroxine (SYNTHROID, LEVOTHROID) tablet 200 mcg  200 mcg Oral Q0600 Nehemiah Settle, MD   200 mcg at 08/10/13 0630  . loperamide (IMODIUM) capsule 4 mg  4 mg Oral PRN Verne Spurr, PA-C   4 mg at 08/10/13 0747  . loratadine (CLARITIN) tablet 10 mg  10 mg Oral Daily PRN Evanna Janann August, NP      . magnesium hydroxide (MILK OF MAGNESIA) suspension 30 mL  30 mL Oral Daily PRN Verne Spurr, PA-C      . metoprolol succinate (TOPROL-XL) 24 hr tablet 75 mg  75 mg Oral Daily Evanna Cori Merry Proud, NP   75 mg at 08/10/13 0744  . multivitamin with minerals tablet 1 tablet  1 tablet Oral Daily Evanna Janann August, NP   1 tablet at 08/10/13 0745  . pantoprazole (PROTONIX) EC tablet 40 mg  40 mg Oral Daily Evanna Cori Merry Proud, NP   40 mg at 08/10/13 0744  . selenium sulfide (SELSUN) 1 % shampoo   Topical Daily Verne Spurr, PA-C      . venlafaxine XR (EFFEXOR-XR) 24 hr capsule 150 mg  150 mg Oral Q breakfast Nehemiah Settle, MD   150 mg at 08/10/13 0745  . zolpidem (AMBIEN) tablet 5 mg  5 mg Oral QHS PRN Nehemiah Settle, MD   5 mg at 08/09/13 2147    Lab Results:  Results for orders placed during the hospital encounter of 08/07/13 (from the past 48 hour(s))  HEMOGLOBIN A1C     Status: None   Collection Time    08/08/13  7:16 PM      Result Value Range   Hemoglobin A1C 5.5  <5.7 %   Comment: (NOTE)                                                                               According to the ADA Clinical Practice Recommendations for 2011, when     HbA1c is used as a screening test:      >=6.5%   Diagnostic of Diabetes Mellitus               (if abnormal result is confirmed)     5.7-6.4%   Increased risk of developing Diabetes Mellitus     References:Diagnosis and Classification of Diabetes Mellitus,Diabetes     Care,2011,34(Suppl 1):S62-S69 and Standards of Medical Care in             Diabetes - 2011,Diabetes Care,2011,34 (Suppl 1):S11-S61.   Mean Plasma  Glucose 111  <117 mg/dL   Comment: Performed at [facility]  LIPID PANEL     Status: Abnormal   Collection Time    08/09/13  6:10 AM      Result Value Range   Cholesterol 172  0 - 200 mg/dL   Triglycerides 621 (*) <150 mg/dL   HDL 31 (*) >30 mg/dL   Total CHOL/HDL Ratio 5.5  VLDL 43 (*) 0 - 40 mg/dL   LDL Cholesterol 98  0 - 99 mg/dL   Comment:            Total Cholesterol/HDL:CHD Risk     Coronary Heart Disease Risk Table                         Men   Women      1/2 Average Risk   3.4   3.3      Average Risk       5.0   4.4      2 X Average Risk   9.6   7.1      3 X Average Risk  23.4   11.0                Use the calculated Patient Ratio     above and the CHD Risk Table     to determine the patient's CHD Risk.                ATP III CLASSIFICATION (LDL):      <100     mg/dL   Optimal      161-096  mg/dL   Near or Above                        Optimal      130-159  mg/dL   Borderline      045-409  mg/dL   High      >811     mg/dL   Very High     Performed at [facility]  TSH     Status: Abnormal   Collection Time    08/09/13  6:10 AM      Result Value Range   TSH 0.159 (*) 0.350 - 4.500 uIU/mL   Comment: Performed at [facility]  T4, FREE     Status: None   Collection Time    08/09/13  6:10 AM      Result Value Range   Free T4 1.52  0.80 - 1.80 ng/dL   Comment: Performed at [facility]    Physical Findings: AIMS: Facial and Oral Movements Muscles of Facial Expression: None, normal Lips and Perioral Area: None, normal Jaw: None, normal Tongue: None, normal,Extremity Movements Upper (arms, wrists, hands, fingers): None, normal Lower (legs, knees, ankles, toes): None, normal, Trunk Movements Neck, shoulders, hips: None, normal, Overall Severity Severity of abnormal movements (highest score from questions above): None, normal Incapacitation due to abnormal movements: None, normal Patient's awareness of abnormal movements  (rate only patient's report): No Awareness, Dental Status Current problems with teeth and/or dentures?: No Does patient usually wear dentures?: No  CIWA:    COWS:     Treatment Plan Summary: Daily contact with patient to assess and evaluate symptoms and progress in treatment Medication management  Plan: 1. Continue crisis management and stabilization. 2. Medication management to reduce current symptoms to base line and improve patient's overall level of functioning 3. Treat health problems as indicated. 4. Develop treatment plan to decrease risk of relapse upon discharge and the need for     readmission. 5. Psycho-social education regarding relapse prevention and self care. 6. Health care follow up as needed for medical problems. 7. Continue home medications where appropriate. 8. ELOS: 1 plan to d/c home in AM.   Medical Decision Making Problem Points:  Established problem, worsening (2), New  problem, with no additional work-up planned (3), Review of last therapy session (1) and Review of psycho-social stressors (1) Data Points:  Review or order clinical lab tests (1) Review or order medicine tests (1) Review of medication regiment & side effects (2) Review of new medications or change in dosage (2)  I certify that inpatient services furnished can reasonably be expected to improve the patient's condition.  Rona Ravens. Mashburn RPAC 11:53 PM 08/10/2013  Reviewed the information documented and agree with the treatment plan.  Valinda Fedie,JANARDHAHA R. 08/12/2013 2:55 PM

## 2013-08-10 NOTE — Progress Notes (Signed)
Recreation Therapy Notes  Date: 09.18.2014 Time: 2:45pm Location: [address] Dayroom  Group Topic: Animal Assisted Activities (AAA)  Behavioral Response: Engaged, Attentive, Appropriate  Affect: Euthymic  Clinical Observations/Feedback: Dog Team: Slate & handler. Patient interacted appropriately with peer, dog team, LRT and MHT.   Trellis Vanoverbeke L Rexanna Louthan, LRT/CTRS  Anna Livers L 08/10/2013 4:27 PM 

## 2013-08-10 NOTE — Progress Notes (Signed)
Adult Psychoeducational Group Note  Date:  08/10/2013 Time:  6:15 PM  Group Topic/Focus:  Overcoming Stress:   The focus of this group is to define stress and help patients assess their triggers.  Participation Level:  Active  Participation Quality:  Appropriate  Affect:  Appropriate  Cognitive:  Appropriate  Insight: Appropriate  Engagement in Group:  Engaged  Modes of Intervention:  Discussion, Education, Socialization and Support  Additional Comments:  Pt stated her main stressor is finances and money. Pt stated she wanted to learn how to fly a plane to relieve stress.  [name] 08/10/2013, 6:15 PM

## 2013-08-10 NOTE — Progress Notes (Addendum)
Patient ID: Melinda Krause, female   DOB: 09/08/1961, 52 y.o.   MRN: [id number] D: pt. Visited with husband and family this evening. Reports with med change sleep well last night. Pt. Rates depression at "4" of 10, affect seems brighter. Pt. Focus on husband and how hard he works. "I told him to go home and get some rest, put his feet up and relax" A: Writer provided emotional support encouraged her focus on her mental well being at this time and to consider husband is working to provide for his family. Staff encouraged group. Staff will monitor q27min for safety. R: pt. Is safe on the unit and attended karaoke.

## 2013-08-10 NOTE — Progress Notes (Signed)
D:  Per pt self inventory pt reports sleeping fair, appetite improving, energy level normal, ability to pay attention good, rates depression at a 4 out of 10 and hopelessness at a 3 out of 10, denies SI/HI/AVH.   A:  Emotional support provided, Encouraged pt to continue with treatment plan and attend all group activities, q15 min checks maintained for safety.  R:  Pt is irritable, cooperative, feels like she wants to d/c today, attending groups.

## 2013-08-11 MED ORDER — ESOMEPRAZOLE MAGNESIUM 40 MG PO CPDR: 40.0000 mg | DELAYED_RELEASE_CAPSULE | Freq: Every day | ORAL | Status: DC

## 2013-08-11 MED ORDER — HYDROXYZINE HCL 50 MG PO TABS: 50.0000 mg | ORAL_TABLET | Freq: Three times a day (TID) | ORAL | Status: DC

## 2013-08-11 MED ORDER — SELENIUM SULFIDE 1 % EX LOTN: 1.0000 "application " | TOPICAL_LOTION | Freq: Every day | CUTANEOUS | Status: DC

## 2013-08-11 MED ORDER — CLONAZEPAM 0.5 MG PO TABS: 0.5000 mg | ORAL_TABLET | Freq: Two times a day (BID) | ORAL | Status: DC

## 2013-08-11 MED ORDER — VENLAFAXINE HCL ER 150 MG PO CP24: 150.0000 mg | ORAL_CAPSULE | Freq: Every day | ORAL | Status: DC

## 2013-08-11 MED ORDER — METOPROLOL SUCCINATE ER 25 MG PO TB24: 75.0000 mg | ORAL_TABLET | Freq: Every day | ORAL | Status: DC

## 2013-08-11 MED ORDER — ZOLPIDEM TARTRATE 10 MG PO TABS: 10.0000 mg | ORAL_TABLET | Freq: Every day | ORAL | Status: DC

## 2013-08-11 NOTE — BHH Group Notes (Signed)
[facility] LCSW Aftercare Discharge Planning Group Note   08/11/2013  8:45 AM  Participation Quality:  Alert and Appropriate   Mood/Affect:  Appropriate and Calm  Depression Rating:  3  Anxiety Rating:  2  Thoughts of Suicide:  Pt denies SI/HI  Will you contract for safety?   Yes  Current AVH:  Pt denies  Plan for Discharge/Comments:  Pt attended discharge planning group and actively participated in group.  CSW provided pt with today's workbook.  Pt reports feeling stable to d/c today.  Pt reports that she will start utilizing support groups at [facility] for additional support upon d/c.  Pt will return home in [place] and will follow up at [facility] for medication management and therapy.  No further needs voiced by pt at this time.    Transportation Means: Pt reports access to transportation - husband will pick pt up  Supports: No supports mentioned at this time  Reyes Ivan, LCSWA 08/11/2013 9:54 AM

## 2013-08-11 NOTE — BHH Suicide Risk Assessment (Signed)
Suicide Risk Assessment  Discharge Assessment     Demographic Factors:  Adolescent or young adult, Caucasian and Low socioeconomic status  Mental Status Per Nursing Assessment::   On Admission:  NA  Current Mental Status by Physician: Mental Status Examination: Patient appeared as per his stated age, casually dressed, and fairly groomed, and maintaining good eye contact. Patient has good mood and his affect was constricted. He has normal rate, rhythm, and volume of speech. His thought process is linear and goal directed. Patient has denied suicidal, homicidal ideations, intentions or plans. Patient has no evidence of auditory or visual hallucinations, delusions, and paranoia. Patient has fair insight judgment and impulse control.  Loss Factors: Financial problems/change in socioeconomic status  Historical Factors: Family history of mental illness or substance abuse and Impulsivity  Risk Reduction Factors:   Sense of responsibility to family, Religious beliefs about death, Living with another person, especially a relative, Positive social support, Positive therapeutic relationship and Positive coping skills or problem solving skills  Continued Clinical Symptoms:  Severe Anxiety and/or Agitation Depression:   Recent sense of peace/wellbeing Previous Psychiatric Diagnoses and Treatments Medical Diagnoses and Treatments/Surgeries  Cognitive Features That Contribute To Risk:  Polarized thinking    Suicide Risk:  Minimal: No identifiable suicidal ideation.  Patients presenting with no risk factors but with morbid ruminations; may be classified as minimal risk based on the severity of the depressive symptoms  Discharge Diagnoses:   AXIS I:  Generalized Anxiety Disorder and Major Depression, Recurrent severe AXIS II:  Deferred AXIS III:   Past Medical History  Diagnosis Date  . History of chicken pox     childhood  . Depression     Counseling-Dr [facility]  .  Headache(784.0)     daily  . Peptic ulcer   . Seasonal allergies   . Migraine     1-2 per month  . Hypothyroidism   . Fibromyalgia     Rheumatologist- Dr. Ardis Hughs  . Hypertension   . Hyperlipidemia   . Pain in joint, shoulder region 04/02/2013    left   AXIS IV:  other psychosocial or environmental problems and problems related to social environment AXIS V:  61-70 mild symptoms  Plan Of Care/Follow-up recommendations:  Activity:  As tolerated Diet:  Regular  Is patient on multiple antipsychotic therapies at discharge:  No   Has Patient had three or more failed trials of antipsychotic monotherapy by history:  No  Recommended Plan for Multiple Antipsychotic Therapies: NA  Zyshonne Malecha,JANARDHAHA R. 08/11/2013, 12:36 PM

## 2013-08-11 NOTE — Progress Notes (Signed)
[facility] Adult Case Management Discharge Plan :  Will you be returning to the same living situation after discharge: Yes,  returning home At discharge, do you have transportation home?:Yes,  husband will pick pt up Do you have the ability to pay for your medications:Yes,  access to meds  Release of information consent forms completed and in the chart;  Patient's signature needed at discharge.  Patient to Follow up at: Follow-up Information   Follow up with [facility]  On 08/29/2013. (Appointment scheduled at 12:40 pm with Tamela Oddi for medication management.  Have to call to schedule therapy appointment.)    Contact information:   [address], [address]  [place], [place] [address] Phone: [phone] Fax: [phone]      Patient denies SI/HI:   Yes,  denies SI/HI    Safety Planning and Suicide Prevention discussed:  Yes,  discussed with pt and pt's husband.  See suicide prevention education note.   Carmina Miller 08/11/2013, 10:04 AM

## 2013-08-11 NOTE — Progress Notes (Signed)
Pt discharged per MD orders; pt currently denies SI/HI and auditory/visual hallucinations; pt was given education by RN regarding follow-up appointments and medications and pt denied any questions or concerns about these instructions; pt was then escorted to search room to retrieve her belongings by RN before being discharged to hospital lobby. 

## 2013-08-11 NOTE — Progress Notes (Signed)
Adult Psychoeducational Group Note  Date:  08/11/2013 Time:  1:26 PM  Group Topic/Focus:  Early Warning Signs:   The focus of this group is to help patients identify signs or symptoms they exhibit before slipping into an unhealthy state or crisis.  Participation Level:  Minimal  Participation Quality:  Attentive  Affect:  Flat  Cognitive:  Alert and Appropriate  Insight: Good  Engagement in Group:  Engaged  Modes of Intervention:  Discussion, Exploration, Socialization and Support  Additional Comments:  Pt came to group and shared that 2 of her early warning signs were isolating herself and having negative thoughts.  Cathlean Cower 08/11/2013, 1:26 PM

## 2013-08-11 NOTE — Tx Team (Signed)
Interdisciplinary Treatment Plan Update (Adult)  Date: 08/11/2013  Time Reviewed:  9:45 AM  Progress in Treatment: Attending groups: Yes Participating in groups:  Yes Taking medication as prescribed:  Yes Tolerating medication:  Yes Family/Significant othe contact made: Yes Patient understands diagnosis:  Yes Discussing patient identified problems/goals with staff:  Yes Medical problems stabilized or resolved:  Yes Denies suicidal/homicidal ideation: Yes Issues/concerns per patient self-inventory:  Yes Other:  New problem(s) identified: N/A  Discharge Plan or Barriers: Pt has follow up scheduled at [facility] for medication management and therapy.    Reason for Continuation of Hospitalization: Stable to d/c  Comments: N/A  Estimated length of stay: D/C today  For review of initial/current patient goals, please see plan of care.  Attendees: Patient:  Melinda Krause  08/11/2013 10:02 AM   Family:     Physician:  Dr. Javier Glazier 08/11/2013 9:59 AM   Nursing:   Nestor Ramp, RN 08/11/2013 9:59 AM   Clinical Social Worker:  Reyes Ivan, LCSWA 08/11/2013 9:59 AM   Other: Verne Spurr, PA 08/11/2013 9:59 AM   Other:  Frankey Shown, MA care coordination 08/11/2013 9:59 AM   Other:  Juline Patch, LCSW 08/11/2013 9:59 AM   Other:  Burnetta Sabin, RN 08/11/2013 10:00 AM   Other:    Other:    Other:    Other:    Other:    Other:     Scribe for Treatment Team:   Carmina Miller, 08/11/2013 9:59 AM

## 2013-08-11 NOTE — Discharge Summary (Signed)
Physician Discharge Summary Note  Patient:  Melinda Krause is an 52 y.o., female MRN:  [id number] DOB:  22-Dec-1960 Patient phone:  [phone] (home)  Patient address:   [address] [place] [address],   Date of Admission:  08/07/2013 Date of Discharge:08/11/2013   Reason for Admission:  Depression with suicidal ideation  Discharge Diagnoses: Principal Problem:   Generalized anxiety disorder Active Problems:   Depression  Review of Systems  Constitutional: Negative.  Negative for fever, chills, weight loss, malaise/fatigue and diaphoresis.  HENT: Negative for congestion and sore throat.   Eyes: Negative for blurred vision, double vision and photophobia.  Respiratory: Negative for cough, shortness of breath and wheezing.   Cardiovascular: Negative for chest pain, palpitations and PND.  Gastrointestinal: Negative for heartburn, nausea, vomiting, abdominal pain, diarrhea and constipation.  Musculoskeletal: Negative for myalgias, joint pain and falls.  Neurological: Negative for dizziness, tingling, tremors, sensory change, speech change, focal weakness, seizures, loss of consciousness, weakness and headaches.  Endo/Heme/Allergies: Negative for polydipsia. Does not bruise/bleed easily.  Psychiatric/Behavioral: Negative for depression, suicidal ideas, hallucinations, memory loss and substance abuse. The patient is not nervous/anxious and does not have insomnia.     DSM5: Schizophrenia Disorders:  Obsessive-Compulsive Disorders:  Trauma-Stressor Disorders:  Substance/Addictive Disorders:  Depressive Disorders: Major Depressive Disorder - Severe (296.23)  AXIS I: Generalized Anxiety Disorder and Major Depression, Recurrent severe  AXIS II: Deferred  AXIS III:  Past Medical History   Diagnosis  Date   .  History of chicken pox      childhood   .  Depression      Counseling-Dr [facility]   .  Headache(784.0)      daily   .  Peptic ulcer     .  Seasonal allergies    .  Migraine      1-2 per month   .  Hypothyroidism    .  Fibromyalgia      Rheumatologist- Dr. Ardis Hughs   .  Hypertension    .  Hyperlipidemia    .  Pain in joint, shoulder region  04/02/2013     left    AXIS IV: occupational problems, other psychosocial or environmental problems and problems related to social environment  AXIS V: 41-50 serious symptoms  Level of Care:  OP  Hospital Course:  Melinda Krause was admitted from [facility] where she presented for increasing depression and suicidal ideation. She was felt to be in need of acute psychiatric hospitalization and stabilization. She was transferred to [facility] for crisis management and stabilization.      Upon arrival at the unit Melinda Krause was evaluated and her symptoms were identified. Medication managementt was initiated.  Her medication was verified but did not appear on the [facility] registry for controlled substances. Melinda Krause was irritated and demanding on the first day on the unit and signed a 72 hour request for discharge. She was encouraged to participate in unit programming.  She was able to do so and responded well to a therapeutic supportive environment. She reported no side effects to her medication. She also noted improved sleep, reduced pain, and improved appetite.      She attended groups and worked closely with the CM to develop a good follow up plan for care upon her discharge from [facility]. Melinda Krause noted significantly reduced symptoms and was in much improved condition on discharge than upon arrival. She denied SI/HI and voiced no AVH. She was in full contact with reality and  was engaged in her post discharge care. She was motivated to continue her recovery and to continue taking her medications. Her husband was also supportive of her as well.  Consults:  None  Significant Diagnostic Studies:  None  Discharge Vitals:   Blood pressure 124/80, pulse 93, temperature 97.5 F (36.4 C), temperature source  Oral, resp. rate 20, height 5\' 4"  (1.626 m), weight 125.193 kg (276 lb). Body mass index is 47.35 kg/(m^2). Lab Results:   Results for orders placed during the hospital encounter of 08/07/13 (from the past 72 hour(s))  HEMOGLOBIN A1C     Status: None   Collection Time    08/08/13  7:16 PM      Result Value Range   Hemoglobin A1C 5.5  <5.7 %   Comment: (NOTE)                                                                               According to the ADA Clinical Practice Recommendations for 2011, when     HbA1c is used as a screening test:      >=6.5%   Diagnostic of Diabetes Mellitus               (if abnormal result is confirmed)     5.7-6.4%   Increased risk of developing Diabetes Mellitus     References:Diagnosis and Classification of Diabetes Mellitus,Diabetes     Care,2011,34(Suppl 1):S62-S69 and Standards of Medical Care in             Diabetes - 2011,Diabetes Care,2011,34 (Suppl 1):S11-S61.   Mean Plasma Glucose 111  <117 mg/dL   Comment: Performed at [facility]  LIPID PANEL     Status: Abnormal   Collection Time    08/09/13  6:10 AM      Result Value Range   Cholesterol 172  0 - 200 mg/dL   Triglycerides 098 (*) <150 mg/dL   HDL 31 (*) >11 mg/dL   Total CHOL/HDL Ratio 5.5     VLDL 43 (*) 0 - 40 mg/dL   LDL Cholesterol 98  0 - 99 mg/dL   Comment:            Total Cholesterol/HDL:CHD Risk     Coronary Heart Disease Risk Table                         Men   Women      1/2 Average Risk   3.4   3.3      Average Risk       5.0   4.4      2 X Average Risk   9.6   7.1      3 X Average Risk  23.4   11.0                Use the calculated Patient Ratio     above and the CHD Risk Table     to determine the patient's CHD Risk.                ATP III CLASSIFICATION (LDL):      <100     mg/dL  Optimal      100-129  mg/dL   Near or Above                        Optimal      130-159  mg/dL   Borderline      409-811  mg/dL   High      >914     mg/dL   Very High      Performed at [facility]  TSH     Status: Abnormal   Collection Time    08/09/13  6:10 AM      Result Value Range   TSH 0.159 (*) 0.350 - 4.500 uIU/mL   Comment: Performed at [facility]  T4, FREE     Status: None   Collection Time    08/09/13  6:10 AM      Result Value Range   Free T4 1.52  0.80 - 1.80 ng/dL   Comment: Performed at [facility]    Physical Findings: AIMS: Facial and Oral Movements Muscles of Facial Expression: None, normal Lips and Perioral Area: None, normal Jaw: None, normal Tongue: None, normal,Extremity Movements Upper (arms, wrists, hands, fingers): None, normal Lower (legs, knees, ankles, toes): None, normal, Trunk Movements Neck, shoulders, hips: None, normal, Overall Severity Severity of abnormal movements (highest score from questions above): None, normal Incapacitation due to abnormal movements: None, normal Patient's awareness of abnormal movements (rate only patient's report): No Awareness, Dental Status Current problems with teeth and/or dentures?: No Does patient usually wear dentures?: No  CIWA:    COWS:     Psychiatric Specialty Exam: See Psychiatric Specialty Exam and Suicide Risk Assessment completed by Attending Physician prior to discharge.  Discharge destination:  Home  Is patient on multiple antipsychotic therapies at discharge:  No   Has Patient had three or more failed trials of antipsychotic monotherapy by history:  No  Recommended Plan for Multiple Antipsychotic Therapies: NA  Discharge Orders   Future Orders Complete By Expires   Diet - low sodium heart healthy  As directed    Discharge instructions  As directed    Comments:     Take all of your medications as directed. Be sure to keep all of your follow up appointments.  If you are unable to keep your follow up appointment, call your Doctor's office to let them know, and reschedule.  Make sure that you have enough medication to last until your  appointment. Be sure to get plenty of rest. Going to bed at the same time each night will help. Try to avoid sleeping during the day.  Increase your activity as tolerated. Regular exercise will help you to sleep better and improve your mental health. Eating a heart healthy diet is recommended. Try to avoid salty or fried foods. Be sure to avoid all alcohol and illegal drugs.   Increase activity slowly  As directed        Medication List    STOP taking these medications       ALPRAZolam 0.25 MG tablet  Commonly known as:  XANAX     ALPRAZolam 0.5 MG tablet  Commonly known as:  XANAX     citalopram 20 MG tablet  Commonly known as:  CELEXA     cyclobenzaprine 5 MG tablet  Commonly known as:  FLEXERIL     hydrochlorothiazide 25 MG tablet  Commonly known as:  HYDRODIURIL     loperamide 2 MG capsule  Commonly known as:  IMODIUM     methylPREDNISolone 4 MG tablet  Commonly known as:  MEDROL DOSEPAK     multivitamin with minerals Tabs tablet     PROBIOTIC FORMULA PO     promethazine 25 MG tablet  Commonly known as:  PHENERGAN     traMADol 50 MG tablet  Commonly known as:  ULTRAM     triazolam 0.25 MG tablet  Commonly known as:  HALCION     WELLBUTRIN XL 300 MG 24 hr tablet  Generic drug:  buPROPion     zolpidem 12.5 MG CR tablet  Commonly known as:  AMBIEN CR  Replaced by:  zolpidem 10 MG tablet      TAKE these medications     Indication   clonazePAM 0.5 MG tablet  Commonly known as:  KLONOPIN  Take 1 tablet (0.5 mg total) by mouth 2 (two) times daily. For anxiety.   Indication:  anxiety     esomeprazole 40 MG capsule  Commonly known as:  NEXIUM  Take 1 capsule (40 mg total) by mouth daily before breakfast. For GERD   Indication:  Treatment to Prevent Stress Ulcers     HYDROcodone-acetaminophen 5-325 MG per tablet  Commonly known as:  NORCO/VICODIN  Take 1 tablet by mouth every 6 (six) hours as needed for pain.      hydrOXYzine 50 MG tablet  Commonly  known as:  ATARAX/VISTARIL  Take 1 tablet (50 mg total) by mouth 3 (three) times daily. For anxiety.   Indication:  Anxiety Neurosis     levothyroxine 150 MCG tablet  Commonly known as:  SYNTHROID, LEVOTHROID  Take 1 tablet (150 mcg total) by mouth daily.      loratadine 10 MG tablet  Commonly known as:  CLARITIN  Take 10 mg by mouth daily as needed for allergies.      metoprolol succinate 25 MG 24 hr tablet  Commonly known as:  TOPROL-XL  Take 3 tablets (75 mg total) by mouth daily. Take with or immediately following a meal for hypertension and anxiety.   Indication:  Feeling Anxious, High Blood Pressure     selenium sulfide 1 % Lotn  Commonly known as:  SELSUN  Apply 1 application topically daily. For seborrhea.   Indication:  Seborrheic Dermatitis, Dandruff     venlafaxine XR 150 MG 24 hr capsule  Commonly known as:  EFFEXOR-XR  Take 1 capsule (150 mg total) by mouth daily with breakfast. For anxiety and depression.   Indication:  Generalized Anxiety Disorder, Major Depressive Disorder     zolpidem 10 MG tablet  Commonly known as:  AMBIEN  Take 1 tablet (10 mg total) by mouth at bedtime. For insomnia.   Indication:  Trouble Sleeping           Follow-up Information   Follow up with [facility]  On 08/29/2013. (Appointment scheduled at 12:40 pm with Tamela Oddi for medication management.  Have to call to schedule therapy appointment.)    Contact information:   [address], [address]  [place], [place] [address] Phone: [phone] Fax: [phone]      Follow-up recommendations:   Activities: Resume activity as tolerated. Diet: Heart healthy low sodium diet Tests: Follow up testing will be determined by your out patient provider. Comments:    Total Discharge Time:  Greater than 30 minutes.  Signed: Rona Ravens. Mashburn RPAC 10:56 PM 08/13/2013  Patient seen, evaluated personally, completed suicidal risk assessment, case discussed with Physician extender  and  discharge treatment plan developed. Reviewed the information documented and agree with the treatment plan.  Janayia Burggraf,JANARDHAHA R. 08/14/2013 3:19 PM

## 2013-08-16 NOTE — Progress Notes (Signed)
Patient Discharge Instructions:  After Visit Summary (AVS):   Faxed to:  08/16/13 Discharge Summary Note:   Faxed to:  08/16/13 Psychiatric Admission Assessment Note:   Faxed to:  08/16/13 Suicide Risk Assessment - Discharge Assessment:   Faxed to:  08/16/13 Faxed/Sent to the Next Level Care provider:  08/16/13 Faxed to [facility] @ [phone]  Jerelene Redden, 08/16/2013, 3:50 PM

## 2013-09-13 ENCOUNTER — Other Ambulatory Visit (HOSPITAL_COMMUNITY): Payer: Self-pay | Admitting: Psychiatry

## 2013-09-25 DIAGNOSIS — T859XXA Unspecified complication of internal prosthetic device, implant and graft, initial encounter: Secondary | ICD-10-CM | POA: Insufficient documentation

## 2013-09-25 DIAGNOSIS — N939 Abnormal uterine and vaginal bleeding, unspecified: Secondary | ICD-10-CM | POA: Insufficient documentation

## 2013-09-25 DIAGNOSIS — N3946 Mixed incontinence: Secondary | ICD-10-CM | POA: Insufficient documentation

## 2013-10-23 ENCOUNTER — Encounter: Payer: Self-pay | Admitting: Family

## 2013-10-23 ENCOUNTER — Ambulatory Visit (INDEPENDENT_AMBULATORY_CARE_PROVIDER_SITE_OTHER): Payer: BC Managed Care – PPO | Admitting: Family

## 2013-10-23 VITALS — BP 156/100 | HR 86 | Temp 98.0°F | Resp 16 | Ht 64.0 in | Wt 272.1 lb

## 2013-10-23 DIAGNOSIS — G894 Chronic pain syndrome: Secondary | ICD-10-CM

## 2013-10-23 DIAGNOSIS — N898 Other specified noninflammatory disorders of vagina: Secondary | ICD-10-CM

## 2013-10-23 DIAGNOSIS — M797 Fibromyalgia: Secondary | ICD-10-CM

## 2013-10-23 DIAGNOSIS — IMO0001 Reserved for inherently not codable concepts without codable children: Secondary | ICD-10-CM

## 2013-10-23 DIAGNOSIS — F3289 Other specified depressive episodes: Secondary | ICD-10-CM

## 2013-10-23 DIAGNOSIS — E039 Hypothyroidism, unspecified: Secondary | ICD-10-CM

## 2013-10-23 DIAGNOSIS — I10 Essential (primary) hypertension: Secondary | ICD-10-CM

## 2013-10-23 DIAGNOSIS — F32A Depression, unspecified: Secondary | ICD-10-CM

## 2013-10-23 DIAGNOSIS — N939 Abnormal uterine and vaginal bleeding, unspecified: Secondary | ICD-10-CM

## 2013-10-23 DIAGNOSIS — F329 Major depressive disorder, single episode, unspecified: Secondary | ICD-10-CM

## 2013-10-23 LAB — TSH: TSH: 0.519 u[IU]/mL (ref 0.350–4.500)

## 2013-10-23 MED ORDER — CYCLOBENZAPRINE HCL 5 MG PO TABS: 5.0000 mg | ORAL_TABLET | Freq: Three times a day (TID) | ORAL | Status: DC | PRN

## 2013-10-23 MED ORDER — HYDROCHLOROTHIAZIDE 25 MG PO TABS: 25.0000 mg | ORAL_TABLET | Freq: Every day | ORAL | Status: DC

## 2013-10-23 MED ORDER — HYDROCODONE-ACETAMINOPHEN 5-325 MG PO TABS: 1.0000 | ORAL_TABLET | Freq: Four times a day (QID) | ORAL | Status: DC | PRN

## 2013-10-23 MED ORDER — METOPROLOL SUCCINATE ER 100 MG PO TB24: 100.0000 mg | ORAL_TABLET | Freq: Every day | ORAL | Status: DC

## 2013-10-23 NOTE — Assessment & Plan Note (Signed)
For removal of vaginal mesh next week pending our clearance.  Will need to review lab work from [facility] and also see improved blood pressure prior to clearance.

## 2013-10-23 NOTE — Patient Instructions (Addendum)
Complete lab work prior to leaving.  Take HCTZ every day. Increase metoprolol to 100mg  once daily.  Call me with your blood pressure on Wednesday. Follow up on Friday for a nurse visit. You will be contacted about your referral to pain management,

## 2013-10-23 NOTE — Assessment & Plan Note (Signed)
TSH at behavioral health was low.  Per pt synthroid was not adjusted. Repeat tsh today, adjust as needed.

## 2013-10-23 NOTE — Assessment & Plan Note (Signed)
Deteriorated.  Increase toprol xl from 75mg  daily to 100mg  daily. Take hctz 25 mg every day. Pt to call us on Wednesday with bp reading.  Request pre-operative labs, ekg from [facility].  Pt reports that all of this was done before thanksgiving.  Medical records release signed today.

## 2013-10-23 NOTE — Progress Notes (Signed)
   Subjective:    Patient ID: Melinda Krause, female    DOB: 1961/04/14, 52 y.o.   MRN: [id number]  HPI  Ms.  Krause is a 52 yr old female who presents today for pre-operative clearance. She is scheduled for a rectocele repair and removal of vaginal mesh. Surgery is scheduled for 12/8.  Reports that over last 1 year she has had increased bleeding/pelvic pressure over the last 6 months.    She was at behavioral health and was there for 1 month.  She is following with Starling Manns at [facility].  Reports that her depression worsened over the summer.  Was having suicidal thoughts at that time.  She was placed back on effexor. Reports feeling better, going to church, attending a support group.   HTN-   BP Readings from Last 3 Encounters:  10/23/13 156/100  08/11/13 124/80  08/07/13 186/83    Reports that she had elevated blood pressure at her pre-operative clearance.  She does reports HA's, dizziness, burning pain up and down the spine, reports nipples "burn."  Pins and needles in the back of the neck.  She has fibromyalgia.   Review of Systems  Constitutional: Negative for unexpected weight change.  HENT: Negative for rhinorrhea.   Respiratory: Negative for cough and shortness of breath.   Gastrointestinal:       Chronic nausea and chronic diarrhea- at baseline.   Genitourinary: Negative for dysuria.       Reports vaginal bleeding with urination and BM  Musculoskeletal: Positive for arthralgias and back pain.  Skin: Negative for rash.  Neurological: Positive for headaches.  Hematological: Negative for adenopathy.  Psychiatric/Behavioral:       Denies SI/HI       Objective:   Physical Exam  Constitutional: She is oriented to person, place, and time. She appears well-developed and well-nourished. No distress.  Cardiovascular: Normal rate and regular rhythm.   No murmur heard. Pulmonary/Chest: Effort normal and breath sounds normal. No respiratory distress. She has  no wheezes. She has no rales. She exhibits no tenderness.  Musculoskeletal: She exhibits no edema.  Neurological: She is alert and oriented to person, place, and time.  Psychiatric: Judgment and thought content normal. She is slowed. Cognition and memory are normal.  Flat affect          Assessment & Plan:

## 2013-10-23 NOTE — Progress Notes (Signed)
Pre visit review using our clinic review tool, if applicable. No additional management support is needed unless otherwise documented below in the visit note. 

## 2013-10-23 NOTE — Assessment & Plan Note (Signed)
Complains of ongoing fibromyalgia pain along with chronic back pain.  Had MRI of the lumbar spine in 2002 which noted degenerative disc disease.  She declines follow up MRI at this time due to cost. Requesting refill on vicodin. Requesting larger quantity than the 30 tabs she has been given previously. I advised pt I would give one refill, but ultimately she will need to establish with pain management.  She is agreeable to pain management referral.  A controlled substance contract is signed today.

## 2013-10-23 NOTE — Assessment & Plan Note (Signed)
Fair control.  She is managed by psychiatry. Defer management to psychiatry.

## 2013-10-24 ENCOUNTER — Encounter: Payer: Self-pay | Admitting: Family

## 2013-10-26 ENCOUNTER — Telehealth: Payer: Self-pay | Admitting: Family Medicine

## 2013-10-26 MED ORDER — METOPROLOL SUCCINATE ER 50 MG PO TB24: 50.0000 mg | ORAL_TABLET | Freq: Every day | ORAL | Status: DC

## 2013-10-26 NOTE — Telephone Encounter (Signed)
Please advise 

## 2013-10-26 NOTE — Telephone Encounter (Signed)
Detailed message left on vm. 50 mg sent to pharmacy

## 2013-10-26 NOTE — Telephone Encounter (Signed)
Have her increase her Toprol XL to 150 mg daily (3 tabs of the 50 mg daily) disp #90, then come in forbp check next week

## 2013-10-26 NOTE — Telephone Encounter (Signed)
Patient left message stating that she saw Melissa on 10/23/13 for high bp and surgical clearance. Patient states that her bp has been   186/106 180/100 178/102   Even since metropolol has been increased.

## 2013-11-03 ENCOUNTER — Ambulatory Visit (INDEPENDENT_AMBULATORY_CARE_PROVIDER_SITE_OTHER): Payer: BC Managed Care – PPO | Admitting: Family

## 2013-11-03 ENCOUNTER — Encounter: Payer: Self-pay | Admitting: Family

## 2013-11-03 DIAGNOSIS — I10 Essential (primary) hypertension: Secondary | ICD-10-CM

## 2013-11-03 LAB — BASIC METABOLIC PANEL
BUN: 11 mg/dL (ref 6–23)
CO2: 31 mEq/L (ref 19–32)
Calcium: 9.5 mg/dL (ref 8.4–10.5)
Chloride: 97 mEq/L (ref 96–112)
Creat: 0.87 mg/dL (ref 0.50–1.10)
Glucose, Bld: 94 mg/dL (ref 70–99)
Potassium: 4.4 mEq/L (ref 3.5–5.3)
Sodium: 138 mEq/L (ref 135–145)

## 2013-11-03 NOTE — Progress Notes (Signed)
Subjective:    Patient ID: Melinda Krause, female    DOB: May 22, 1961, 52 y.o.   MRN: [id number]  HPI  [name] is a 52 yr old female who presents today for follow up of HTN.  Last visit her Toprol xl was increased from 75mg  to 100mg .  Home BP readings were elevated and Toprol xl was further increased to 150mg  daily.  She is scheduled for an upcoming rectocele repair and removal of vaginal mesh at [facility] with Dr. Roseanne Reno.  It was requested that she see [place] for BP optimization prior to upcoming procedure.   BP Readings from Last 3 Encounters:  11/03/13 130/80  10/23/13 156/100  08/11/13 124/80     Review of Systems See HPI  Past Medical History  Diagnosis Date  . History of chicken pox     childhood  . Depression     Counseling-Dr [facility]  . Headache(784.0)     daily  . Peptic ulcer   . Seasonal allergies   . Migraine     1-2 per month  . Hypothyroidism   . Fibromyalgia     Rheumatologist- Dr. Ardis Hughs  . Hypertension   . Hyperlipidemia   . Pain in joint, shoulder region 04/02/2013    left    History   Social History  . Marital Status: Married    Spouse Name: N/A    Number of Children: N/A  . 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 Smoking status: Never Smoker   . Smokeless tobacco: Never Used  . Alcohol Use: No  . Drug Use: No  . Sexual Activity: Yes    Birth Control/ Protection: None   Other Topics Concern  . Not on file   Social History Narrative  . No narrative on file    Past Surgical History  Procedure Laterality Date  . Vaginal hysterectomy  2003  . Cesarean section  1989  . Rectocele repair  2009  . Vagina reconstruction surgery  2011    vaginal mesh repair  . Colonoscopy    . Upper gastrointestinal endoscopy      Family History  Problem Relation Age of Onset  . Emphysema Mother   . Allergies Mother   . Asthma  Mother   . Heart disease Father   . Rheum arthritis Maternal Grandfather   . Cancer Mother   . Cancer Maternal Grandmother   . Heart disease Mother   . Heart disease Other     grandparents    Allergies  Allergen Reactions  . Demerol     Irritability, and shortness of breath    Current Outpatient Prescriptions on File Prior to Visit  Medication Sig Dispense Refill  . clonazePAM (KLONOPIN) 0.5 MG tablet Take 1 tablet (0.5 mg total) by mouth 2 (two) times daily. For anxiety.  30 tablet  0  . cyclobenzaprine (FLEXERIL) 5 MG tablet Take 1 tablet (5 mg total) by mouth 3 (three) times daily as needed for muscle spasms.  30 tablet  0  . esomeprazole (NEXIUM) 40 MG capsule Take 1 capsule (40 mg total) by mouth daily before breakfast. For GERD      . hydrochlorothiazide (HYDRODIURIL) 25 MG tablet Take 1 tablet (25 mg total) by mouth daily.  30 tablet  2  . HYDROcodone-acetaminophen (NORCO/VICODIN) 5-325 MG per tablet Take 1 tablet by mouth every  6 (six) hours as needed.  30 tablet  0  . hydrOXYzine (ATARAX/VISTARIL) 50 MG tablet Take 1 tablet (50 mg total) by mouth 3 (three) times daily. For anxiety.  90 tablet  0  . levothyroxine (SYNTHROID, LEVOTHROID) 150 MCG tablet Take 1 tablet (150 mcg total) by mouth daily.  90 tablet  3  . loratadine (CLARITIN) 10 MG tablet Take 10 mg by mouth daily as needed for allergies.      . metoprolol succinate (TOPROL-XL) 100 MG 24 hr tablet Take 1 tablet (100 mg total) by mouth daily. Take with or immediately following a meal.  90 tablet  3  . metoprolol succinate (TOPROL-XL) 50 MG 24 hr tablet Take 1 tablet (50 mg total) by mouth daily. Take with the 100 mg for a total of 150 mg  90 tablet  0  . venlafaxine XR (EFFEXOR-XR) 150 MG 24 hr capsule Take 1 capsule (150 mg total) by mouth daily with breakfast. For anxiety and depression.  30 capsule  0  . zolpidem (AMBIEN) 10 MG tablet Take 1 tablet (10 mg total) by mouth at bedtime. For insomnia.  30 tablet  0   No  current facility-administered medications on file prior to visit.    BP 130/80  Pulse 87  Temp(Src) 97.5 F (36.4 C) (Oral)  Resp 16  Ht 5\' 4"  (1.626 m)  Wt 269 lb 0.6 oz (122.036 kg)  BMI 46.16 kg/m2  SpO2 98%       Objective:   Physical Exam  Constitutional: She is oriented to person, place, and time. She appears well-developed and well-nourished. No distress.  HENT:  Head: Normocephalic and atraumatic.  Cardiovascular: Normal rate and regular rhythm.   No murmur heard. Pulmonary/Chest: Effort normal and breath sounds normal. No respiratory distress. She has no wheezes. She has no rales. She exhibits no tenderness.  Musculoskeletal: She exhibits no edema.  Neurological: She is alert and oriented to person, place, and time.  Psychiatric: She has a normal mood and affect. Her behavior is normal. Judgment and thought content normal.          Assessment & Plan:

## 2013-11-03 NOTE — Progress Notes (Signed)
Pre visit review using our clinic review tool, if applicable. No additional management support is needed unless otherwise documented below in the visit note. 

## 2013-11-03 NOTE — Patient Instructions (Signed)
Please complete lab work prior to leaving. Follow up in 3 months.  

## 2013-11-06 ENCOUNTER — Encounter: Payer: Self-pay | Admitting: Family

## 2013-11-06 ENCOUNTER — Telehealth: Payer: Self-pay | Admitting: Family

## 2013-11-06 ENCOUNTER — Other Ambulatory Visit: Payer: Self-pay | Admitting: Family

## 2013-11-06 NOTE — Telephone Encounter (Signed)
See letter. Could you please fax to Dr. Roseanne Reno re: BP clearance for upcoming surgery.  I believe her surgery is later this week.

## 2013-11-07 NOTE — Telephone Encounter (Signed)
Letter and office note faxed to [phone].

## 2013-11-07 NOTE — Assessment & Plan Note (Signed)
BP is improved and stable for upcoming procedure. Continue HCTZ and toprol xl.

## 2013-11-07 NOTE — Telephone Encounter (Signed)
Medication name:  Name from pharmacy:  cyclobenzaprine (FLEXERIL) 5 MG tablet  CYCLOBENZAPRINE 5 MG TABLET Sig: TAKE 1 TABLET BY MOUTH 3 TIMES A DAY AS NEEDED FOR MUSCLE SPASM Dispense: 30 tablet Refills: 0 Start: 11/06/2013 Class: Normal Requested on: 10/23/2013 Originally ordered on: 10/23/2013 Last refill: 10/23/2013

## 2013-11-28 ENCOUNTER — Ambulatory Visit: Payer: Self-pay | Admitting: Family Medicine

## 2013-11-30 ENCOUNTER — Other Ambulatory Visit: Payer: Self-pay | Admitting: Family

## 2013-12-01 NOTE — Telephone Encounter (Signed)
OK to send #60 tabs zero refills.  

## 2013-12-01 NOTE — Telephone Encounter (Signed)
Medication name:  Name from pharmacy:  cyclobenzaprine (FLEXERIL) 5 MG tablet  CYCLOBENZAPRINE 5 MG TABLET Sig: TAKE 1 TABLET BY MOUTH 3 TIMES A DAY AS NEEDED FOR MUSCLE SPASM Dispense: 60 tablet Refills: 0 Start: 11/30/2013 Class: Normal Requested on: 11/08/2013 Originally ordered on: 10/23/2013 Last refill: 11/08/2013

## 2013-12-04 NOTE — Telephone Encounter (Signed)
Rx called to pharmacy voicemail as below. 

## 2013-12-14 DIAGNOSIS — Z09 Encounter for follow-up examination after completed treatment for conditions other than malignant neoplasm: Secondary | ICD-10-CM | POA: Insufficient documentation

## 2014-01-08 ENCOUNTER — Other Ambulatory Visit: Payer: Self-pay | Admitting: Family

## 2014-01-17 ENCOUNTER — Ambulatory Visit: Payer: BC Managed Care – PPO | Admitting: Family

## 2014-01-17 DIAGNOSIS — Z0289 Encounter for other administrative examinations: Secondary | ICD-10-CM

## 2014-01-24 DIAGNOSIS — R102 Pelvic and perineal pain: Secondary | ICD-10-CM | POA: Insufficient documentation

## 2014-01-30 DIAGNOSIS — E039 Hypothyroidism, unspecified: Secondary | ICD-10-CM | POA: Insufficient documentation

## 2014-02-08 ENCOUNTER — Other Ambulatory Visit: Payer: Self-pay | Admitting: Family

## 2014-02-08 NOTE — Telephone Encounter (Signed)
Refill request for cyclobenzaprine Last filled by MD on- 01/11/2014 #60 x0 Last Appt: 11/03/2013 Next Appt: none Please advise refill?

## 2014-02-16 ENCOUNTER — Emergency Department (HOSPITAL_COMMUNITY)
Admission: EM | Admit: 2014-02-16 | Discharge: 2014-02-16 | Disposition: A | Discharge status: Home / Self Care | Payer: BC Managed Care – PPO | Attending: Emergency Medicine | Admitting: Emergency Medicine

## 2014-02-16 ENCOUNTER — Emergency Department (HOSPITAL_COMMUNITY): Payer: BC Managed Care – PPO

## 2014-02-16 ENCOUNTER — Encounter (HOSPITAL_COMMUNITY): Payer: Self-pay | Admitting: Emergency Medicine

## 2014-02-16 DIAGNOSIS — K117 Disturbances of salivary secretion: Secondary | ICD-10-CM | POA: Insufficient documentation

## 2014-02-16 DIAGNOSIS — F3289 Other specified depressive episodes: Secondary | ICD-10-CM | POA: Insufficient documentation

## 2014-02-16 DIAGNOSIS — G4700 Insomnia, unspecified: Secondary | ICD-10-CM | POA: Insufficient documentation

## 2014-02-16 DIAGNOSIS — F411 Generalized anxiety disorder: Secondary | ICD-10-CM | POA: Insufficient documentation

## 2014-02-16 DIAGNOSIS — I10 Essential (primary) hypertension: Secondary | ICD-10-CM | POA: Insufficient documentation

## 2014-02-16 DIAGNOSIS — IMO0001 Reserved for inherently not codable concepts without codable children: Secondary | ICD-10-CM | POA: Insufficient documentation

## 2014-02-16 DIAGNOSIS — Z8711 Personal history of peptic ulcer disease: Secondary | ICD-10-CM | POA: Insufficient documentation

## 2014-02-16 DIAGNOSIS — R202 Paresthesia of skin: Secondary | ICD-10-CM

## 2014-02-16 DIAGNOSIS — E039 Hypothyroidism, unspecified: Secondary | ICD-10-CM | POA: Insufficient documentation

## 2014-02-16 DIAGNOSIS — E663 Overweight: Secondary | ICD-10-CM | POA: Insufficient documentation

## 2014-02-16 DIAGNOSIS — R51 Headache: Secondary | ICD-10-CM | POA: Insufficient documentation

## 2014-02-16 DIAGNOSIS — E785 Hyperlipidemia, unspecified: Secondary | ICD-10-CM | POA: Insufficient documentation

## 2014-02-16 DIAGNOSIS — F329 Major depressive disorder, single episode, unspecified: Secondary | ICD-10-CM | POA: Insufficient documentation

## 2014-02-16 DIAGNOSIS — Z79899 Other long term (current) drug therapy: Secondary | ICD-10-CM | POA: Insufficient documentation

## 2014-02-16 DIAGNOSIS — G43909 Migraine, unspecified, not intractable, without status migrainosus: Secondary | ICD-10-CM | POA: Insufficient documentation

## 2014-02-16 DIAGNOSIS — R209 Unspecified disturbances of skin sensation: Secondary | ICD-10-CM | POA: Insufficient documentation

## 2014-02-16 DIAGNOSIS — Z8619 Personal history of other infectious and parasitic diseases: Secondary | ICD-10-CM | POA: Insufficient documentation

## 2014-02-16 LAB — COMPREHENSIVE METABOLIC PANEL
ALT: 18 U/L (ref 0–35)
AST: 20 U/L (ref 0–37)
Albumin: 3.4 g/dL — ABNORMAL LOW (ref 3.5–5.2)
Alkaline Phosphatase: 99 U/L (ref 39–117)
BUN: 14 mg/dL (ref 6–23)
CO2: 27 mEq/L (ref 19–32)
Calcium: 9.2 mg/dL (ref 8.4–10.5)
Chloride: 102 mEq/L (ref 96–112)
Creatinine, Ser: 0.63 mg/dL (ref 0.50–1.10)
GFR calc Af Amer: >90 mL/min (ref >90)
GFR calc non Af Amer: >90 mL/min (ref >90)
Glucose, Bld: 108 mg/dL — ABNORMAL HIGH (ref 70–99)
Potassium: 3.9 mEq/L (ref 3.7–5.3)
Sodium: 141 mEq/L (ref 137–147)
Total Bilirubin: 0.6 mg/dL (ref 0.3–1.2)
Total Protein: 6.3 g/dL (ref 6.0–8.3)

## 2014-02-16 LAB — RAPID URINE DRUG SCREEN, HOSP PERFORMED
Amphetamines: NOT DETECTED
Barbiturates: NOT DETECTED
Benzodiazepines: NOT DETECTED
Cocaine: NOT DETECTED
Opiates: NOT DETECTED
Tetrahydrocannabinol: NOT DETECTED

## 2014-02-16 LAB — I-STAT CHEM 8, ED
BUN: 13 mg/dL (ref 6–23)
Calcium, Ion: 1.2 mmol/L (ref 1.12–1.23)
Chloride: 103 mEq/L (ref 96–112)
Creatinine, Ser: 0.7 mg/dL (ref 0.50–1.10)
Glucose, Bld: 107 mg/dL — ABNORMAL HIGH (ref 70–99)
HCT: 40 % (ref 36.0–46.0)
Hemoglobin: 13.6 g/dL (ref 12.0–15.0)
Potassium: 3.7 mEq/L (ref 3.7–5.3)
Sodium: 139 mEq/L (ref 137–147)
TCO2: 27 mmol/L (ref 0–100)

## 2014-02-16 LAB — URINALYSIS, ROUTINE W REFLEX MICROSCOPIC
Bilirubin Urine: NEGATIVE
Glucose, UA: NEGATIVE mg/dL
Hgb urine dipstick: NEGATIVE
Ketones, ur: NEGATIVE mg/dL
Leukocytes, UA: NEGATIVE
Nitrite: NEGATIVE
Protein, ur: NEGATIVE mg/dL
Specific Gravity, Urine: 1.019 (ref 1.005–1.030)
Urobilinogen, UA: 0.2 mg/dL (ref 0.0–1.0)
pH: 7 (ref 5.0–8.0)

## 2014-02-16 LAB — CBC
HCT: 38 % (ref 36.0–46.0)
Hemoglobin: 12.9 g/dL (ref 12.0–15.0)
MCH: 27.4 pg (ref 26.0–34.0)
MCHC: 33.9 g/dL (ref 30.0–36.0)
MCV: 80.9 fL (ref 78.0–100.0)
Platelets: 292 10*3/uL (ref 150–400)
RBC: 4.7 MIL/uL (ref 3.87–5.11)
RDW: 15.1 % (ref 11.5–15.5)
WBC: 6.7 10*3/uL (ref 4.0–10.5)

## 2014-02-16 LAB — DIFFERENTIAL
Basophils Absolute: 0 10*3/uL (ref 0.0–0.1)
Basophils Relative: 1 % (ref 0–1)
Eosinophils Absolute: 0.2 10*3/uL (ref 0.0–0.7)
Eosinophils Relative: 4 % (ref 0–5)
Lymphocytes Relative: 33 % (ref 12–46)
Lymphs Abs: 2.2 10*3/uL (ref 0.7–4.0)
Monocytes Absolute: 0.5 10*3/uL (ref 0.1–1.0)
Monocytes Relative: 7 % (ref 3–12)
Neutro Abs: 3.7 10*3/uL (ref 1.7–7.7)
Neutrophils Relative %: 55 % (ref 43–77)

## 2014-02-16 LAB — PROTIME-INR
INR: 0.96 (ref 0.00–1.49)
Prothrombin Time: 12.6 seconds (ref 11.6–15.2)

## 2014-02-16 LAB — APTT: aPTT: 27 seconds (ref 24–37)

## 2014-02-16 LAB — I-STAT TROPONIN, ED: Troponin i, poc: 0.01 ng/mL (ref 0.00–0.08)

## 2014-02-16 LAB — ETHANOL: Alcohol, Ethyl (B): <11 mg/dL (ref 0–11)

## 2014-02-16 NOTE — ED Notes (Signed)
Dr. Wentz at bedside. 

## 2014-02-16 NOTE — ED Provider Notes (Signed)
CSN: [id number]     Arrival date & time 02/16/14  4098 History   First MD Initiated Contact with Patient 02/16/14 0840     Chief Complaint  Patient presents with  . Transient Ischemic Attack     (Consider location/radiation/quality/duration/timing/severity/associated sxs/prior Treatment) The history is provided by the patient.   Melinda Krause is a 53 y.o. female who presents for evaluation of numbness, left facial. She noticed this upon awakening at 7:30 AM, this morning. This discomfort is improving, but has not completely resolved. She also felt like her face was drooping, and that she was drooling from the left side of her mouth. She feels the droopy feeling, and drooling, has ceased. No one was with her to comment on the appearance of her face. After awakening, she tried to walk to the bathroom, and noticed that her left leg felt weak. That weakness in the left leg has resolved. She called an ambulance to bring her here. She has numerous other complaints including insomnia, poorly fitting C-Pap mask, unequal pupils, nightmares, and a current headache , that started this morning. She states that she has had a similar episode 2 months ago, but did not get it checked out, then. 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 This morning, at 4:30 AM, her right third finger was locked, and her husband had to pull on it to straighten it out. There are no other known modifying factors.     Past Medical History  Diagnosis Date  . History of chicken pox     childhood  . Depression     Counseling-Dr [facility]  . Headache(784.0)     daily  . Peptic ulcer   . Seasonal allergies   . Migraine     1-2 per month  . Hypothyroidism   . Fibromyalgia     Rheumatologist- Dr. Ardis Hughs  . Hypertension   . Hyperlipidemia   . Pain in joint, shoulder region 04/02/2013    left   Past Surgical History  Procedure Laterality Date  . Vaginal  hysterectomy  2003  . Cesarean section  1989  . Rectocele repair  2009  . Vagina reconstruction surgery  2011    vaginal mesh repair  . Colonoscopy    . Upper gastrointestinal endoscopy     Family History  Problem Relation Age of Onset  . Emphysema Mother   . Allergies Mother   . Asthma Mother   . Heart disease Father   . Rheum arthritis Maternal Grandfather   . Cancer Mother   . Cancer Maternal Grandmother   . Heart disease Mother   . Heart disease Other     grandparents   History  Substance Use Topics  . Smoking status: Never Smoker   . Smokeless tobacco: Never Used  . Alcohol Use: No   OB History   Grav Para Term Preterm Abortions TAB SAB Ect Mult Living                 Review of Systems  All other systems reviewed and are negative.      Allergies  Demerol  Home Medications   Current Outpatient Rx  Name  Route  Sig  Dispense  Refill  . clonazePAM (KLONOPIN) 0.5 MG tablet   Oral   Take 1 tablet (0.5 mg total) by mouth 2 (two) times daily. For anxiety.   30 tablet   0   . cyclobenzaprine (FLEXERIL) 5 MG tablet  Oral   Take 5 mg by mouth 3 (three) times daily as needed for muscle spasms.         Marland Kitchen. esomeprazole (NEXIUM) 40 MG capsule   Oral   Take 1 capsule (40 mg total) by mouth daily before breakfast. For GERD         . hydrochlorothiazide (HYDRODIURIL) 25 MG tablet   Oral   Take 1 tablet (25 mg total) by mouth daily.   30 tablet   2   . HYDROcodone-acetaminophen (NORCO/VICODIN) 5-325 MG per tablet   Oral   Take 1 tablet by mouth every 6 (six) hours as needed.   30 tablet   0   . hydrOXYzine (ATARAX/VISTARIL) 50 MG tablet   Oral   Take 1 tablet (50 mg total) by mouth 3 (three) times daily. For anxiety.   90 tablet   0   . levothyroxine (SYNTHROID, LEVOTHROID) 150 MCG tablet   Oral   Take 1 tablet (150 mcg total) by mouth daily.   90 tablet   3   . loratadine (CLARITIN) 10 MG tablet   Oral   Take 10 mg by mouth daily as needed  for allergies.         . metoprolol succinate (TOPROL-XL) 100 MG 24 hr tablet   Oral   Take 1 tablet (100 mg total) by mouth daily. Take with or immediately following a meal.   90 tablet   3   . metoprolol succinate (TOPROL-XL) 50 MG 24 hr tablet   Oral   Take 1 tablet (50 mg total) by mouth daily. Take with the 100 mg for a total of 150 mg   90 tablet   0   . venlafaxine XR (EFFEXOR-XR) 150 MG 24 hr capsule   Oral   Take 1 capsule (150 mg total) by mouth daily with breakfast. For anxiety and depression.   30 capsule   0   . zolpidem (AMBIEN CR) 12.5 MG CR tablet   Oral   Take 12.5 mg by mouth at bedtime as needed for sleep.          BP 126/77  Pulse 70  Temp(Src) 98.4 F (36.9 C) (Oral)  Resp 20  Ht 5\' 4"  (1.626 m)  Wt 275 lb (124.739 kg)  BMI 47.18 kg/m2  SpO2 98% Physical Exam  Nursing note and vitals reviewed. Constitutional: She is oriented to person, place, and time. She appears well-developed.  Overweight  HENT:  Head: Normocephalic and atraumatic.  Right Ear: External ear normal.  Left Ear: External ear normal.  Eyes: Conjunctivae and EOM are normal.  Very mild, pupil asymmetry, 1 mm. . Pupils are reactive. No abnormalities of the iris.  Neck: Normal range of motion and phonation normal. Neck supple.  Cardiovascular: Normal rate, regular rhythm, normal heart sounds and intact distal pulses.   Pulmonary/Chest: Effort normal and breath sounds normal. She exhibits no bony tenderness.  Abdominal: Soft. Normal appearance. There is no tenderness.  Musculoskeletal: Normal range of motion.  Neurological: She is alert and oriented to person, place, and time. No cranial nerve deficit or sensory deficit. She exhibits normal muscle tone. Coordination normal.  No dysarthria, any aphasia, or nystagmus. Normal finger to nose bilaterally. Normal heel-to-shin bilaterally. No altered light touch sensation of the face, hands, legs or feet.  Skin: Skin is warm, dry and  intact.  Psychiatric: Her behavior is normal. Judgment and thought content normal.  Anxious, and somewhat distracted    ED Course  Procedures (including critical care time)  09:00- nonspecific and improving symptoms not indicating CVA. NIH score is 0 at 08:39. TPA, is not indicated.  Medications - No data to display  Patient Vitals for the past 24 hrs:  BP Temp Temp src Pulse Resp SpO2 Height Weight  02/16/14 1335 126/77 mmHg - - 70 20 98 % - -  02/16/14 1118 134/65 mmHg - - 64 18 96 % - -  02/16/14 1115 134/65 mmHg - - 66 15 96 % - -  02/16/14 1100 134/77 mmHg - - 66 14 99 % - -  02/16/14 1048 130/60 mmHg - - 71 12 97 % - -  02/16/14 0930 144/74 mmHg - - 65 24 97 % - -  02/16/14 0900 135/59 mmHg - - 81 20 90 % - -  02/16/14 0857 - 98.4 F (36.9 C) - - - - - -  02/16/14 0845 155/78 mmHg - - 77 16 99 % - -  02/16/14 0831 148/72 mmHg 98 F (36.7 C) Oral 71 17 98 % - -  02/16/14 0830 148/72 mmHg - - - 12 - 5\' 4"  (1.626 m) 275 lb (124.739 kg)  02/16/14 0825 - - - - - 98 % - -    1:41 PM Reevaluation with update and discussion. After initial assessment and treatment, an updated evaluation reveals She feels better. No worsening SX. PE unchanged. Findings discussed with pt and husband. All questions answered. Axil Copeman L    Labs Review Labs Reviewed  COMPREHENSIVE METABOLIC PANEL - Abnormal; Notable for the following:    Glucose, Bld 108 (*)    Albumin 3.4 (*)    All other components within normal limits  URINALYSIS, ROUTINE W REFLEX MICROSCOPIC - Abnormal; Notable for the following:    APPearance CLOUDY (*)    All other components within normal limits  I-STAT CHEM 8, ED - Abnormal; Notable for the following:    Glucose, Bld 107 (*)    All other components within normal limits  ETHANOL  PROTIME-INR  APTT  CBC  DIFFERENTIAL  URINE RAPID DRUG SCREEN (HOSP PERFORMED)  I-STAT TROPOININ, ED  Rosezena Sensor, ED   Imaging Review Mr Brain Wo Contrast  02/16/2014    CLINICAL DATA:  Left facial numbness. Tingling left leg. History of hypertension.  EXAM: MRI HEAD WITHOUT CONTRAST  TECHNIQUE: Multiplanar, multiecho pulse sequences of the brain and surrounding structures were obtained without intravenous contrast.  COMPARISON:  None.  FINDINGS: No evidence for acute infarction, hemorrhage, mass lesion, hydrocephalus, or extra-axial fluid. Normal cerebral volume. No significant white matter disease. Pituitary, pineal, and cerebellar tonsils unremarkable. No upper cervical lesions. Flow voids are maintained throughout the carotid, basilar, and vertebral arteries. There are no areas of chronic hemorrhage. Visualized calvarium, skull base, and upper cervical osseous structures unremarkable. Scalp and extracranial soft tissues, orbits, sinuses, and mastoids show no acute process.  IMPRESSION: Negative exam. No intracranial abnormality is seen to explain the patient's left facial numbness and tingling in the left leg.   Electronically Signed   By: Davonna Belling M.D.   On: 02/16/2014 11:09     EKG Interpretation   Date/Time:  Friday February 16 2014 08:28:55 EDT Ventricular Rate:  72 PR Interval:  145 QRS Duration: 85 QT Interval:  395 QTC Calculation: 432 R Axis:   74 Text Interpretation:  Sinus rhythm Baseline wander in lead(s) II III aVF  V1 V3 V4 V5 V6 since last tracing no significant change Confirmed by [facility]  MD, Mechele Collin ([id number]) on 02/16/2014 8:41:23 AM      MDM   Final diagnoses:  Paresthesia    Nonspecific paresthesia. Doubt TIA, CVA metabolic instability, or acute CNS abnormality. She's improved spontaneously. She has follow up with her doctors already scheduled, in [place], [place].  Nursing Notes Reviewed/ Care Coordinated Applicable Imaging Reviewed Interpretation of Laboratory Data incorporated into ED treatment  The patient appears reasonably screened and/or stabilized for discharge and I doubt any other medical condition or other [facility]  requiring further screening, evaluation, or treatment in the ED at this time prior to discharge.  Plan: Home Medications- usual; Home Treatments- rest; return here if the recommended treatment, does not improve the symptoms; Recommended follow up- PCP as scheduled    Flint Melter, MD 02/16/14 1345

## 2014-02-16 NOTE — ED Notes (Signed)
Pt undressed, in gown, on monitor, continuous pulse oximetry and blood pressure cuff 

## 2014-02-16 NOTE — ED Notes (Signed)
Patient has returned from MRI

## 2014-02-16 NOTE — ED Notes (Signed)
Per ems: patient woke up at 0400 this morning with right hand cramping and numb, patient had to have husband help her relax it.  Woke up again at 0700 with left sided facial numbness/drooping/drooling, left leg weakness and numbness which has now resolved except for some left sided facial numbness that still persists.  No objective neuro deficits noted during triage, patient ambulated to bed with steady gait.  Patient is c/o headache and has hx of same, this headache feels like previous headaches

## 2014-02-16 NOTE — ED Notes (Signed)
Pt has returned from being out of the department; pt placed back on monitor, continuous pulse oximetry and blood pressure cuff; family at bedside 

## 2014-02-16 NOTE — ED Notes (Signed)
Patient to MRI.

## 2014-02-16 NOTE — ED Notes (Signed)
Pt aware of need of urine specimen; pt unable to provide at this time but will let us know when she can; family at bedside

## 2014-02-16 NOTE — Discharge Instructions (Signed)
There is no sign of stroke, or significant problems, today, to explain the numbness that you have. Follow up with your doctor, in [place], as scheduled. Return here, if needed, for problems.     Paresthesia Paresthesia is an abnormal burning or prickling sensation. This sensation is generally felt in the hands, arms, legs, or feet. However, it may occur in any part of the body. It is usually not painful. The feeling may be described as:  Tingling or numbness.  "Pins and needles."  Skin crawling.  Buzzing.  Limbs "falling asleep."  Itching. Most people experience temporary (transient) paresthesia at some time in their lives. CAUSES  Paresthesia may occur when you breathe too quickly (hyperventilation). It can also occur without any apparent cause. Commonly, paresthesia occurs when pressure is placed on a nerve. The feeling quickly goes away once the pressure is removed. For some people, however, paresthesia is a long-lasting (chronic) condition caused by an underlying disorder. The underlying disorder may be:  A traumatic, direct injury to nerves. Examples include a:  Broken (fractured) neck.  Fractured skull.  A disorder affecting the brain and spinal cord (central nervous system). Examples include:  Transverse myelitis.  Encephalitis.  Transient ischemic attack.  Multiple sclerosis.  Stroke.  Tumor or blood vessel problems, such as an arteriovenous malformation pressing against the brain or spinal cord.  A condition that damages the peripheral nerves (peripheral neuropathy). Peripheral nerves are not part of the brain and spinal cord. These conditions include:  Diabetes.  Peripheral vascular disease.  Nerve entrapment syndromes, such as carpal tunnel syndrome.  Shingles.  Hypothyroidism.  Vitamin B12 deficiencies.  Alcoholism.  Heavy metal poisoning (lead, arsenic).  Rheumatoid arthritis.  Systemic lupus erythematosus. DIAGNOSIS  Your caregiver  will attempt to find the underlying cause of your paresthesia. Your caregiver may:  Take your medical history.  Perform a physical exam.  Order various lab tests.  Order imaging tests. TREATMENT  Treatment for paresthesia depends on the underlying cause. HOME CARE INSTRUCTIONS  Avoid drinking alcohol.  You may consider massage or acupuncture to help relieve your symptoms.  Keep all follow-up appointments as directed by your caregiver. SEEK IMMEDIATE MEDICAL CARE IF:   You feel weak.  You have trouble walking or moving.  You have problems with speech or vision.  You feel confused.  You cannot control your bladder or bowel movements.  You feel numbness after an injury.  You faint.  Your burning or prickling feeling gets worse when walking.  You have pain, cramps, or dizziness.  You develop a rash. MAKE SURE YOU:  Understand these instructions.  Will watch your condition.  Will get help right away if you are not doing well or get worse. Document Released: 10/30/2002 Document Revised: 02/01/2012 Document Reviewed: 07/31/2011 [facility] Patient Information 2014 [place], [place].

## 2014-02-16 NOTE — ED Notes (Signed)
Denies feeling any previous reported numb sensation on the left side of her face

## 2014-03-12 ENCOUNTER — Other Ambulatory Visit: Payer: Self-pay | Admitting: Family Medicine

## 2014-03-12 NOTE — Telephone Encounter (Signed)
Please advise refill? It looks like pt was a [facility] patient?  Pt has seen Dr Abner GreenspanBlyth 1 time on 03-29-13, between march 2014 and present pt has had 3 appts w/Melissa last being 11-03-13. 1 cancellation with Dr Beverely Lowabori 1 cancellation with Selena Battenody 2 cancellations with Efraim KaufmannMelissa And 2 no shows with Melissa?    last RX done on 02-08-14 quantity 60 with 0 refills

## 2014-03-12 NOTE — Telephone Encounter (Signed)
Please advise pt of MD's message. thanks

## 2014-03-12 NOTE — Telephone Encounter (Signed)
So she can have an rx for 10 Flexeril but she needs to come in for annual exam and pick a PMD for ongoing prescriptions such as Flexeril

## 2014-03-20 ENCOUNTER — Encounter: Payer: Self-pay | Admitting: Family

## 2014-03-20 ENCOUNTER — Ambulatory Visit (INDEPENDENT_AMBULATORY_CARE_PROVIDER_SITE_OTHER): Payer: BC Managed Care – PPO | Admitting: Family

## 2014-03-20 VITALS — BP 166/88 | HR 127 | Temp 97.8°F | Resp 18 | Ht 64.0 in | Wt 280.1 lb

## 2014-03-20 DIAGNOSIS — M542 Cervicalgia: Secondary | ICD-10-CM

## 2014-03-20 DIAGNOSIS — E039 Hypothyroidism, unspecified: Secondary | ICD-10-CM

## 2014-03-20 DIAGNOSIS — I10 Essential (primary) hypertension: Secondary | ICD-10-CM

## 2014-03-20 DIAGNOSIS — G4733 Obstructive sleep apnea (adult) (pediatric): Secondary | ICD-10-CM

## 2014-03-20 DIAGNOSIS — G479 Sleep disorder, unspecified: Secondary | ICD-10-CM

## 2014-03-20 MED ORDER — LOSARTAN POTASSIUM 25 MG PO TABS: 25.0000 mg | ORAL_TABLET | Freq: Every day | ORAL | Status: DC

## 2014-03-20 MED ORDER — CYCLOBENZAPRINE HCL 5 MG PO TABS: ORAL_TABLET | ORAL | Status: DC

## 2014-03-20 MED ORDER — METOPROLOL SUCCINATE ER 100 MG PO TB24
200.0000 mg | ORAL_TABLET | Freq: Every day | ORAL | Status: DC
Start: 2014-03-20 — End: 2014-07-17

## 2014-03-20 NOTE — Assessment & Plan Note (Signed)
Uncontrolled. Add losartan, follow up in 2 weeks for bp check and follow up bmet.

## 2014-03-20 NOTE — Progress Notes (Signed)
Subjective:    Patient ID: Melinda Krause, female    DOB: 09/11/1961, 53 y.o.   MRN: [id number]  HPI  Melinda Krause is a 53 yr old female who presents today for follow up of multiple medical problems:  1) HTN- currently maintained on hctz, toprol xl 200mg  daily. And hctz once daily.  BP Readings from Last 3 Encounters:  03/20/14 166/88  02/16/14 126/77  11/03/13 130/80    2) Hypothyroid- maintained on synthroid.   Lab Results  Component Value Date   TSH 0.519 10/23/2013   3) OSA- reports that when she wakes up in the AM she often develops contractures in her sleep. Pupils asymmetric in AM.  Had episode of left sided weakness.  Called EMS and was taken to [facility]. Had neg brain MRI. Symptoms resolved.   4) Neck pain- uses flexeril. Reports that she grinds her teeth at night.  Flexeril helps this. She is requesting a refill.   Review of Systems See HPI  Past Medical History  Diagnosis Date  . History of chicken pox     childhood  . Depression     [facility]  . Headache(784.0)     daily  . Peptic ulcer   . Seasonal allergies   . Migraine     1-2 per month  . Hypothyroidism   . Fibromyalgia     Rheumatologist- Dr. Ardis HughsShali Devashwar  . Hypertension   . Hyperlipidemia   . Pain in joint, shoulder region 04/02/2013    left    History   Social History  . Marital Status: Married    Spouse Name: N/A    Number of Children: N/A  . 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 Smoking status: Never Smoker   . Smokeless tobacco: Never Used  . Alcohol Use: No  . Drug Use: No  . Sexual Activity: Yes    Birth Control/ Protection: None   Other Topics Concern  . Not on file   Social History Narrative  . No narrative on file    Past Surgical History  Procedure Laterality Date  . Vaginal hysterectomy  2003  . Cesarean section  1989  . Rectocele repair  2009  .  Vagina reconstruction surgery  2011    vaginal mesh repair  . Colonoscopy    . Upper gastrointestinal endoscopy      Family History  Problem Relation Age of Onset  . Emphysema Mother   . Allergies Mother   . Asthma Mother   . Heart disease Father   . Rheum arthritis Maternal Grandfather   . Cancer Mother   . Cancer Maternal Grandmother   . Heart disease Mother   . Heart disease Other     grandparents    Allergies  Allergen Reactions  . Demerol     Irritability, and shortness of breath    Current Outpatient Prescriptions on File Prior to Visit  Medication Sig Dispense Refill  . clonazePAM (KLONOPIN) 0.5 MG tablet Take 1 tablet (0.5 mg total) by mouth 2 (two) times daily. For anxiety.  30 tablet  0  . cyclobenzaprine (FLEXERIL) 5 MG tablet TAKE 1 TABLET BY MOUTH 3 TIMES A DAY AS NEEDED FOR MUSCLE SPASM  10 tablet  0  . esomeprazole (NEXIUM) 40 MG capsule Take 1 capsule (40 mg total) by mouth daily before breakfast. For  GERD      . hydrochlorothiazide (HYDRODIURIL) 25 MG tablet Take 1 tablet (25 mg total) by mouth daily.  30 tablet  2  . levothyroxine (SYNTHROID, LEVOTHROID) 150 MCG tablet Take 1 tablet (150 mcg total) by mouth daily.  90 tablet  3  . loratadine (CLARITIN) 10 MG tablet Take 10 mg by mouth daily as needed for allergies.      Marland Kitchen. venlafaxine XR (EFFEXOR-XR) 150 MG 24 hr capsule Take 1 capsule (150 mg total) by mouth daily with breakfast. For anxiety and depression.  30 capsule  0  . zolpidem (AMBIEN CR) 12.5 MG CR tablet Take 12.5 mg by mouth at bedtime as needed for sleep.       No current facility-administered medications on file prior to visit.    BP 166/88  Pulse 127  Temp(Src) 97.8 F (36.6 C) (Oral)  Resp 18  Ht 5\' 4"  (1.626 m)  Wt 280 lb 1.9 oz (127.062 kg)  BMI 48.06 kg/m2  SpO2 97%       Objective:   Physical Exam  Constitutional: She is oriented to person, place, and time. She appears well-developed and well-nourished. No distress.  HENT:    Head: Normocephalic and atraumatic.  Cardiovascular: Normal rate and regular rhythm.   No murmur heard. Pulmonary/Chest: Effort normal and breath sounds normal. No respiratory distress. She has no wheezes. She has no rales. She exhibits no tenderness.  Musculoskeletal: She exhibits no edema.  Neurological: She is alert and oriented to person, place, and time.  Psychiatric: She has a normal mood and affect. Her behavior is normal. Judgment and thought content normal.          Assessment & Plan:

## 2014-03-20 NOTE — Patient Instructions (Addendum)
You will be contact about your referral to Neurology.  Please let us know if you have not heard back within 1 week about your referral.  Follow up in 2 weeks for physical and blood pressure check.

## 2014-03-20 NOTE — Assessment & Plan Note (Signed)
Continue flexeril HS PRN.

## 2014-03-20 NOTE — Assessment & Plan Note (Signed)
Sounds like she may have some sleep paralysis. Will refer to Dr. Richardean Chimeraohmeir for further evaluation of her OSA and neuro complaints.

## 2014-03-20 NOTE — Assessment & Plan Note (Signed)
Clinically stable. Continue synthroid.  

## 2014-03-26 ENCOUNTER — Other Ambulatory Visit: Payer: Self-pay | Admitting: Family

## 2014-04-02 ENCOUNTER — Ambulatory Visit (INDEPENDENT_AMBULATORY_CARE_PROVIDER_SITE_OTHER): Payer: BC Managed Care – PPO | Admitting: Neurology

## 2014-04-02 ENCOUNTER — Ambulatory Visit (INDEPENDENT_AMBULATORY_CARE_PROVIDER_SITE_OTHER): Payer: BC Managed Care – PPO | Admitting: Cardiology

## 2014-04-02 ENCOUNTER — Encounter: Payer: Self-pay | Admitting: Neurology

## 2014-04-02 ENCOUNTER — Encounter: Payer: Self-pay | Admitting: Cardiology

## 2014-04-02 VITALS — BP 160/98 | HR 69 | Ht 65.0 in | Wt 280.4 lb

## 2014-04-02 VITALS — BP 165/93 | HR 69 | Resp 18 | Ht 65.5 in | Wt 282.0 lb

## 2014-04-02 DIAGNOSIS — R079 Chest pain, unspecified: Secondary | ICD-10-CM

## 2014-04-02 DIAGNOSIS — I10 Essential (primary) hypertension: Secondary | ICD-10-CM

## 2014-04-02 DIAGNOSIS — R5381 Other malaise: Secondary | ICD-10-CM

## 2014-04-02 DIAGNOSIS — R0989 Other specified symptoms and signs involving the circulatory and respiratory systems: Secondary | ICD-10-CM

## 2014-04-02 DIAGNOSIS — E785 Hyperlipidemia, unspecified: Secondary | ICD-10-CM

## 2014-04-02 DIAGNOSIS — Z9119 Patient's noncompliance with other medical treatment and regimen: Secondary | ICD-10-CM

## 2014-04-02 DIAGNOSIS — R0600 Dyspnea, unspecified: Secondary | ICD-10-CM

## 2014-04-02 DIAGNOSIS — Z91199 Patient's noncompliance with other medical treatment and regimen due to unspecified reason: Secondary | ICD-10-CM

## 2014-04-02 DIAGNOSIS — G4730 Sleep apnea, unspecified: Principal | ICD-10-CM

## 2014-04-02 DIAGNOSIS — F3289 Other specified depressive episodes: Secondary | ICD-10-CM

## 2014-04-02 DIAGNOSIS — R0609 Other forms of dyspnea: Secondary | ICD-10-CM

## 2014-04-02 DIAGNOSIS — G4700 Insomnia, unspecified: Secondary | ICD-10-CM

## 2014-04-02 DIAGNOSIS — F32A Depression, unspecified: Secondary | ICD-10-CM

## 2014-04-02 DIAGNOSIS — F329 Major depressive disorder, single episode, unspecified: Secondary | ICD-10-CM

## 2014-04-02 DIAGNOSIS — Z6841 Body Mass Index (BMI) 40.0 and over, adult: Secondary | ICD-10-CM

## 2014-04-02 DIAGNOSIS — F450 Somatization disorder: Secondary | ICD-10-CM

## 2014-04-02 DIAGNOSIS — M797 Fibromyalgia: Secondary | ICD-10-CM

## 2014-04-02 DIAGNOSIS — Z79899 Other long term (current) drug therapy: Secondary | ICD-10-CM

## 2014-04-02 DIAGNOSIS — R5383 Other fatigue: Secondary | ICD-10-CM

## 2014-04-02 DIAGNOSIS — IMO0001 Reserved for inherently not codable concepts without codable children: Secondary | ICD-10-CM

## 2014-04-02 DIAGNOSIS — G4733 Obstructive sleep apnea (adult) (pediatric): Secondary | ICD-10-CM

## 2014-04-02 HISTORY — DX: Patient's noncompliance with other medical treatment and regimen: Z91.19

## 2014-04-02 HISTORY — DX: Patient's noncompliance with other medical treatment and regimen due to unspecified reason: Z91.199

## 2014-04-02 MED ORDER — LOSARTAN POTASSIUM 50 MG PO TABS
50.0000 mg | ORAL_TABLET | Freq: Every day | ORAL | Status: DC
Start: 2014-04-02 — End: 2014-05-08

## 2014-04-02 NOTE — Progress Notes (Signed)
[facility] SLEEP MEDICINE CLINIC   Provider:  Melvyn Krause  Melinda Krause, M D  Referring Provider: Sandford Krause, Melissa, NP Primary Care Physician:  Lemont Fillers'SULLIVAN,Melinda S., NP  Chief Complaint  Patient presents with  . New Evaluation    Room 10  . Sleep consult    HPI:  Melinda Krause is a 53 y.o. caucasian, right handed female , who is seen here as a referral from NP Melinda Krause for a sleep consultation, with a known diagnosis of OSA.    This patient has been diagnosed with OSA in 2009 at [facility], was titrated to CPAP, but  has been followed by Dr. Marcelyn BruinsKeith Krause , [facility] pulmonology. Her main problem is Insomnia she states here, which was not deocumented on her referral.  She is referred by Melinda CrazeMelissa Krause for further sleep evaluation of possible sleep paralysis. Her machine 's settings are not known,  Compliance is not known and she did not bring the machine.   She has seen Dr. Corliss Krause for dry eyes and fibromyalgia. She reports Anisocoria and uncontrolled high blood pressure. 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  She has been seen in the ER with psychosis after Seroquel was initiated.   The patient goes to be at 11 PM - her husband sleeps in a separate room -and reads in bed, watches TV in living room, often unable to sleep, in spite of using Ambien. Average getting  only3-4 hours of sleep, She wakes up frequntly , and feels " contracted" in the morning. This has been present for 18 month. She often wakes, but is paralyzed. Vivid dreams bother her sleep.  Insomnia started over 10 years ago.  She wakes up with nightmares, and has bruxism and breath holding spells, nocturia. Her hands are fisted , " cramped" up. If not asleep by 3-4 AM she has been awake for 4 days and night in row.  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 She has indeed been diagnosed with Depression and is on Effexor and Ambien. Prescribed by Melinda Krause.   Mrs .  Heart is in reports that she hoped she'll many different health problems and surgeries, as well as social stressors such as taking care of her elderly parents, that she felt unable to address some of her other medical issue is that appeared less acute. The patient has Graves' disease, as does her daughter ( 30 ) poorly controlled hypertension, untreated on structure sleep apnea, but her primary concern here is insomnia which has been chronic. She endorses fatigue severity score at 62 points in the Epworth score at the roll points.  The [facility] sleep study from 05-31-2008 also reported Epworth as 0 /24 . An AHI of 52 , RDI of 53.9. Titrated to 9 cm water at the time. Severe snoring was recorded.   Review of Systems: Out of a complete 14 system review, the patient complains of only the following symptoms, and all other reviewed systems are negative. Fatigue, insomnia for days chronic, depression. Non compliance with CPAP.   History   Social History  . Marital Status: Married    Spouse Name: Melinda Krause    Number of Children: 3  . 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 Smoking status: Never Smoker   . Smokeless tobacco: Never Used  . Alcohol Use: No  . Drug Use: No  .  Sexual Activity: Yes    Birth Control/ Protection: None   Other Topics Concern  . Not on file   Social History Narrative   Patient is married Melinda Krause(Melinda Krause) and lives at home with her husband.   Patient has three children.   Patient is currently employed.   Patient has a Bachelor's degree.   Patient is right-handed.   Patient drinks 2-3 Diet sodas daily.    Family History  Problem Relation Age of Onset  . Emphysema Mother   . Allergies Mother   . Asthma Mother   . Heart disease Father   . Rheum arthritis Maternal Grandfather   . Cancer Mother   . Cancer Maternal Grandmother   . Heart disease Mother   . Heart disease Other      grandparents    Past Medical History  Diagnosis Date  . History of chicken pox     childhood  . Depression     Counseling-Dr Melinda Krause  . Headache(784.0)     daily  . Peptic ulcer   . Seasonal allergies   . Migraine     1-2 per month  . Hypothyroidism   . Fibromyalgia     Rheumatologist- Melinda Krause  . Hypertension   . Hyperlipidemia   . Pain in joint, shoulder region 04/02/2013    left  . Neck injury     C-5-6    Past Surgical History  Procedure Laterality Date  . Vaginal hysterectomy  2003  . Cesarean section  1989  . Rectocele repair  2009  . Vagina reconstruction surgery  2011    vaginal mesh repair  . Colonoscopy    . Upper gastrointestinal endoscopy    . Removal of vaginal mesh  11/2012    Current Outpatient Prescriptions  Medication Sig Dispense Refill  . clonazePAM (KLONOPIN) 0.5 MG tablet Take 1 tablet (0.5 mg total) by mouth 2 (two) times daily. For anxiety.  30 tablet  0  . cyclobenzaprine (FLEXERIL) 5 MG tablet TAKE 1 TABLET BY MOUTH 3 TIMES A DAY AS NEEDED FOR MUSCLE SPASM  30 tablet  0  . esomeprazole (NEXIUM) 40 MG capsule Take 1 capsule (40 mg total) by mouth daily before breakfast. For GERD      . FERROUS SULFATE PO 1 PO QD W/VIT C-TO INCREASE ABSORPTION      . hydrochlorothiazide (HYDRODIURIL) 25 MG tablet Take 1 tablet (25 mg total) by mouth daily.  30 tablet  2  . levothyroxine (SYNTHROID, LEVOTHROID) 150 MCG tablet TAKE 1 TABLET (150 MCG TOTAL) BY MOUTH DAILY.  90 tablet  0  . loratadine (CLARITIN) 10 MG tablet Take 10 mg by mouth daily as needed for allergies.      Marland Kitchen. losartan (COZAAR) 25 MG tablet Take 1 tablet (25 mg total) by mouth daily.  30 tablet  0  . metoprolol succinate (TOPROL-XL) 100 MG 24 hr tablet Take 2 tablets (200 mg total) by mouth daily. Take with or immediately following a meal.  60 tablet  2  . Multiple Vitamins-Minerals (MULTIVITAMIN PO) Take 1 tablet by mouth daily.      . naproxen sodium (ANAPROX)  220 MG tablet Take 220 mg by mouth 2 (two) times daily with a meal.      . venlafaxine XR (EFFEXOR-XR) 150 MG 24 hr capsule Take 1 capsule (150 mg total) by mouth daily with breakfast. For anxiety and depression.  30 capsule  0  . zolpidem (AMBIEN CR) 12.5 MG CR tablet  Take 12.5 mg by mouth at bedtime as needed for sleep.       No current facility-administered medications for this visit.    Allergies as of 04/02/2014 - Review Complete 04/02/2014  Allergen Reaction Noted  . Demerol  07/17/2011    Vitals: BP 165/93  Pulse 69  Resp 18  Ht 5' 5.5" (1.664 m)  Wt 282 lb (127.914 kg)  BMI 46.20 kg/m2 Last Weight:  Wt Readings from Last 1 Encounters:  04/02/14 282 lb (127.914 kg)   Last Height:   Ht Readings from Last 1 Encounters:  04/02/14 5' 5.5" (1.664 m)    Physical exam:  General: The patient is morbidly obese,  and appears not in acute distress. The patient is well groomed. Head: Normocephalic, atraumatic. Neck is supple. Mallampati 3, neck circumference:17.5 inches, small nose, crowded mouth. Bruxism. .  Cardiovascular:  Regular rate and rhythm, without  murmurs or carotid bruit, and without distended neck veins. Respiratory: Lungs are clear to auscultation. Skin:  Without evidence of edema, or rash Trunk: BMI is severely  elevated .  Neurologic exam : The patient is awake and alert, oriented to place and time.  Memory subjective described as intact. There is a normal attention span & concentration ability. Speech is fluent without dysarthria, dysphonia or aphasia. Mood and affect are depressed.  Cranial nerves: Pupils are equal and briskly reactive to light. Funduscopic exam without evidence of pallor or edema. Exophthalmos.   Extraocular movements  in vertical and horizontal planes intact and without nystagmus. Visual fields by finger perimetry are intact. Hearing to finger rub intact.   Facial sensation intact to fine touch. Facial motor strength is symmetric and tongue  and uvula move midline.  Motor exam: Normal tone , muscle bulk and symmetric normal strength in all extremities.  Sensory:  Fine touch, pinprick and vibration were tested in all extremities.  Proprioception is  normal.  Coordination: Rapid alternating movements in the fingers/hands is tested and normal. Finger-to-nose maneuver tested and normal without evidence of ataxia, dysmetria or tremor.  Gait and station: Patient walks without assistive device . Deep tendon reflexes: in the  upper and lower extremities are symmetric and intact. Babinski maneuver response isdowngoing.   Assessment:  After physical and neurologic examination, review of laboratory studies, imaging, neurophysiology testing and pre-existing records, assessment is  1) Also this patient has a history of OSA and is currently untreated,  A lot of her sleep complaints appear depression related.  I would like order a desensitization and split night titration for her , with CO2 measures.- however, this will be of no avail in a patient who reportedly doesn't sleep. Unless her sleep perception is off .  She may need a bruxism mouth guard to protect her teeth.  Her sleep deprivation may be so severe, that hallucinations develop.   Plan:  Treatment plan and additional workup :  I explained , that we can only treat organic sleep disorders.  This is a sleep medicine clinic.  A sleep study would be necessary to allow the best titration, CO2 data and fit of mask.  If she cannot sleep, the sleep study won't benefit her.  Jamas Lav with get CC.

## 2014-04-02 NOTE — Patient Instructions (Addendum)
Your physician recommends that you schedule a follow-up appointment in: After Test  Your physician recommends that you return for lab work CMP, FASTING LIPIDS  Your physician has requested that you have an echocardiogram. Echocardiography is a painless test that uses sound waves to create images of your heart. It provides your doctor with information about the size and shape of your heart and how well your heart's chambers and valves are working. This procedure takes approximately one hour. There are no restrictions for this procedure.  Your physician has requested that you have a lexiscan myoview. For further information please visit [email or web address]. Please follow instruction sheet, as given.  You have been referred to Nutritionist AMY HAGER  Your physician has recommended you make the following change in your medication: Increase Losartan to 50 mg daily

## 2014-04-02 NOTE — Patient Instructions (Signed)

## 2014-04-03 ENCOUNTER — Encounter: Payer: Self-pay | Admitting: Cardiology

## 2014-04-03 DIAGNOSIS — R079 Chest pain, unspecified: Secondary | ICD-10-CM | POA: Insufficient documentation

## 2014-04-03 DIAGNOSIS — Z6841 Body Mass Index (BMI) 40.0 and over, adult: Secondary | ICD-10-CM | POA: Insufficient documentation

## 2014-04-03 DIAGNOSIS — R0600 Dyspnea, unspecified: Secondary | ICD-10-CM | POA: Insufficient documentation

## 2014-04-03 DIAGNOSIS — E785 Hyperlipidemia, unspecified: Secondary | ICD-10-CM | POA: Insufficient documentation

## 2014-04-03 DIAGNOSIS — R0609 Other forms of dyspnea: Secondary | ICD-10-CM | POA: Insufficient documentation

## 2014-04-03 NOTE — Progress Notes (Signed)
PATIENT: Melinda Krause MRN: [id number] DOB: 1961/01/31 PCP: Nance Pear., NP  Clinic Note: Self referral Chief Complaint  Patient presents with  . Hypertension    has had episodes of weakness, headaches, and vision problems due to HBP,  wears C-Pap at night, and is having current problems with her sleep apnea.    HPI: Melinda Krause is a 53 y.o. morbidly obese female with a PMH below who presents today for evaluation treatment of hypertension who also complains of dyspnea and chest discomfort. She is the daughter of for a patient of Dr. Nelida Meuse then passed away a few years ago for palpitations from long-standing ischemic cardiomyopathy.  She states her overall for the past year or so she's had a lot of difficulty with her health.  Interval History: Her main concern overall is that she has significant hypertension that has her quite concerned. She has been undergoing evaluation for sleep apnea CPAP settings. This has been quite difficult for her she's been having a hard time sleeping. But she knows her blood pressures and so his she's been having headaches and blurred vision in the numbing in her hands and legs. She went to emergency room and was evaluated with a negative workup as far as neurologic Doppler. She's been dizzy and lightheaded. She notes significant exertional dyspnea occasionally accompanied by chest discomfort/pressure. He does not occur all the time, but has been noted more of late. She denies any significant resting dyspnea but just simple activities will make her short of breath. She does get short of breath with lying down flat and will occasionally wake up short of breath when her CPAP is not working. 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  She takes 2 mg Naprosyn tablets twice a day for her neck and back pain as well as her fibromyalgia related pains.   She denies any rapid or irregular  heartbeats or palpitations -- she is on 200 mg a day of Toprol. No syncope or near-syncope, but has had strange neurologic sensations. Today her symptoms are not born out to be consistent with TIA or amaurosis fugax symptoms. She denies any melena hematochezia or hematuria. No claudication symptoms. No significant myalgias but she does have arthralgias.  She just saw Dr. Brett Fairy from Neurology earlier today. She is also followed by Dr. Lanny Hurst plans from [facility] pulmonology. She sees Dr. Estanislado Pandy for her fibromyalgia and dry eyes.  Past Medical History  Diagnosis Date  . History of chicken pox     childhood  . Depression     Counseling-Dr [facility]  . Headache(784.0)     daily  . Peptic ulcer   . Seasonal allergies   . Migraine     1-2 per month  . Hypothyroidism   . Fibromyalgia     Rheumatologist- Dr. Ashley Royalty  . Hypertension   . Hyperlipidemia   . Pain in joint, shoulder region 04/02/2013    left  . Neck injury     C-5-6  . Non compliance with medical treatment 04/02/2014  . Morbid obesity with BMI of 45.0-49.9, adult     Prior Cardiac Evaluation and Past Surgical History: Past Surgical History  Procedure Laterality Date  . Vaginal hysterectomy  2003  . Cesarean section  1989  . Rectocele repair  2009  . Vagina reconstruction surgery  2011    vaginal mesh repair  . Colonoscopy    . Upper  gastrointestinal endoscopy    . Removal of vaginal mesh  11/2012    Allergies  Allergen Reactions  . Demerol     Irritability, and shortness of breath    Current Outpatient Prescriptions  Medication Sig Dispense Refill  . clonazePAM (KLONOPIN) 0.5 MG tablet Take 1 tablet (0.5 mg total) by mouth 2 (two) times daily. For anxiety.  30 tablet  0  . cyclobenzaprine (FLEXERIL) 5 MG tablet TAKE 1 TABLET BY MOUTH 3 TIMES A DAY AS NEEDED FOR MUSCLE SPASM  30 tablet  0  . esomeprazole (NEXIUM) 40 MG capsule Take 1 capsule (40 mg total) by mouth daily before  breakfast. For GERD      . FERROUS SULFATE PO 1 PO QD W/VIT C-TO INCREASE ABSORPTION      . hydrochlorothiazide (HYDRODIURIL) 25 MG tablet Take 1 tablet (25 mg total) by mouth daily.  30 tablet  2  . levothyroxine (SYNTHROID, LEVOTHROID) 150 MCG tablet TAKE 1 TABLET (150 MCG TOTAL) BY MOUTH DAILY.  90 tablet  0  . loratadine (CLARITIN) 10 MG tablet Take 10 mg by mouth daily as needed for allergies.      . metoprolol succinate (TOPROL-XL) 100 MG 24 hr tablet Take 2 tablets (200 mg total) by mouth daily. Take with or immediately following a meal.  60 tablet  2  . Multiple Vitamins-Minerals (MULTIVITAMIN PO) Take 1 tablet by mouth daily.      . naproxen sodium (ANAPROX) 220 MG tablet Take 220 mg by mouth 2 (two) times daily with a meal.      . venlafaxine XR (EFFEXOR-XR) 150 MG 24 hr capsule Take 1 capsule (150 mg total) by mouth daily with breakfast. For anxiety and depression.  30 capsule  0  . zolpidem (AMBIEN CR) 12.5 MG CR tablet Take 12.5 mg by mouth at bedtime as needed for sleep.      Marland Kitchen losartan (COZAAR) 25 MG tablet Take 1 tablet (25 mg total) by mouth daily.  30 tablet  9   No current facility-administered medications for this visit.    History   Social History Narrative   Patient is married Evert Kohl) and lives at home with her husband. Three children.   Patient has a Bachelor's degree.  Is currently employed.   Patient is right-handed.   Patient drinks 2-3 Diet sodas daily.   Does not Drink EtOH or smoke   Family History: family history includes Allergies in her mother; Arrhythmia in her son; Asthma in her mother; Cancer in her maternal grandmother and mother; Emphysema in her mother; Heart disease in her father, mother, paternal grandfather, and paternal grandmother; Hypertension in her father, mother, paternal grandmother, and son; Rheum arthritis in her maternal grandfather; Stroke in her paternal grandmother.  ROS: A comprehensive Review of Systems - extensively positive as  indicated above. She has a severely elevated Epworth score, severe fatigue. She was told she had Graves' disease by the neurologist, but is actually on Synthroid now. She has multiple social stressors including caring for her elderly parents. she has not been as compliant with CPAP because it "sn't working right for her".    PHYSICAL EXAM BP 160/98  Pulse 69  Ht '5\' 5"'  (1.651 m)  Wt 280 lb 6.4 oz (127.189 kg)  BMI 46.66 kg/m2 General appearance: alert, cooperative, appears stated age and Anxious, morbidly obese. NAD. Well groomed and answered questions appropriately. Neck: no adenopathy, no carotid bruit, no JVD, supple, symmetrical, trachea midline and Really difficult to assess JVP  due to the neck circumference. Lungs: clear to auscultation bilaterally, normal percussion bilaterally and Nonlabored, good air movement Heart: Very distant heart sounds but sounds like normal S1-S2. Cannot exclude an S4 gallop. Cannot palpate PMI. No other M/R./G. noted. Abdomen: soft, non-tender; bowel sounds normal; no masses,  no organomegaly and Significantly obese. Extremities: Trace pedal edema versus simply puffy obese legs. She has mild varicosities but no major stasis changes. Pulses: 2+ and symmetric Neurologic: Mental status: Alert, oriented, thought content appropriate, affect: increased in intensity Cranial nerves: normal   Adult ECG Report  Rate:  69 ;  Rhythm: normal sinus rhythm; normal axis, voltage and intervals.  Narrative Interpretation:  normal EKG  Recent Labs:  reviewed in Epic. Last lipid panel was in September of 2014.  ASSESSMENT / PLAN:  pleasant morbidly obese woman with multiple medical problems but presented here for evaluation and treatment of hypertension who also notes exertional dyspnea and chest discomfort. Her father had significant CAD history with a major MI in his 90s, but was a smoker. She is not but has multiple risk factors including obesity, hypertension, dyslipidemia  likely consistent with metabolic syndrome.  Hypertension - poorly controlled on multiple medications Recently started losartan. She was supposed to present back to her PCP shortly for BP check and labs. I think the liberty to go ahead and increase the losartan to 50 mg daily. Prior realizing the she was post due to have labs checked by her PCP, I ordered a chemistry panel to be done along with lipids.   This is to evaluate for the recent start of an ARB.  Discussed important cofactors including appropriate treatment of OSA and weight loss. At present, I am not inclined to check renal Doppler unless we are unable to get her blood pressures controlled  Exertional dyspnea This could also be multifactorial, she certainly is deconditioned having not exercised any lately. She is morbidly obese and has significant hypertension.  With all of her risk factors I am concerned about the possibility of either hypertensive cardiomyopathy/heart disease versus the potential for ischemic CAD.  Plan: 2-D echocardiogram and LexiScan Myoview (do not want her to stop 200 mg of Toprol for the stress test, and she does not think she can make it very long the treadmill due to her arthritis pains)  Chest pain with moderate risk for cardiac etiology This did not seem to be one of them more significant symptoms she noted, when it asked about symptoms she did recollect a few occasions of late where she has noted some chest tightness associated with exertional dyspnea. She has also had some at rest, which makes it a bit more atypical.  With risk factors including family history, will evaluate for ischemia with a LexiScan Myoview. See above for reason for LexiScan instead of treadmill  Morbid obesity with BMI of 45.0-49.9, adult She says that she is serious about weight loss. We talked about trying to figure out some exercise, she has been reluctant to do that until she's been evaluated. She is asking for referral to a  nutrition specialist. We have provided her a referral to Estelle Grumbles -- she should be contacted within a few weeks to schedule appointment. Mrs. Varney Daily dual purpose as nutritionist as well as the "life coach " who can assist with recommendations for exercises.  OSA (obstructive sleep apnea) Currently undergoing intensive evaluation for how to appropriately use his CPAP and appropriate settings. This is complicated by what sounds like sleep paralysis or tetany.  Probably a lot of her symptoms are related to fatigue from not getting good sleep.  Hyperlipidemia Not currently on statin. Recent labs do not that bad in September. We'll go ahead and recheck to settle it is now as part of her cardiovascular risk assessment    Orders Placed This Encounter  Procedures  . Comp Met (CMET)  . Lipid Profile  . Myocardial Perfusion Imaging    Standing Status: Future     Number of Occurrences:      Standing Expiration Date: 04/03/2015    Scheduling Instructions:     exhortional dyspnea, chest pain    Order Specific Question:  Where should this test be performed    Answer:  [facility]    Order Specific Question:  Type of stress    Answer:  Lexiscan    Order Specific Question:  Patient weight in lbs    Answer:  280  . EKG 12-Lead  . 2D Echocardiogram without contrast    Standing Status: Future     Number of Occurrences:      Standing Expiration Date: 04/03/2015    Order Specific Question:  Type of Echo    Answer:  Complete    Order Specific Question:  Where should this test be performed    Answer:  [facility]    Order Specific Question:  Reason for exam-Echo    Answer:  Chest Pain  786.50    Order Specific Question:  Reason for exam-Echo    Answer:  Dyspnea  786.09   Meds ordered this encounter  Medications  . losartan (COZAAR) 50 MG tablet    Sig: Take 1 tablet (50 mg total) by mouth daily.    Dispense:  30 tablet    Refill:  9    Followup:  2-3 weeks following  Myoview   Caydee Talkington W. Ellyn Hack, M.D., M.S. Interventional Cardiolgy [facility]

## 2014-04-03 NOTE — Assessment & Plan Note (Signed)
Currently undergoing intensive evaluation for how to appropriately use his CPAP and appropriate settings. This is complicated by what sounds like sleep paralysis or tetany.  Probably a lot of her symptoms are related to fatigue from not getting good sleep.

## 2014-04-03 NOTE — Assessment & Plan Note (Signed)
This did not seem to be one of them more significant symptoms she noted, when it asked about symptoms she did recollect a few occasions of late where she has noted some chest tightness associated with exertional dyspnea. She has also had some at rest, which makes it a bit more atypical.  With risk factors including family history, will evaluate for ischemia with a LexiScan Myoview. See above for reason for LexiScan instead of treadmill

## 2014-04-03 NOTE — Assessment & Plan Note (Signed)
Not currently on statin. Recent labs do not that bad in September. We'll go ahead and recheck to settle it is now as part of her cardiovascular risk assessment

## 2014-04-03 NOTE — Assessment & Plan Note (Addendum)
She says that she is serious about weight loss. We talked about trying to figure out some exercise, she has been reluctant to do that until she's been evaluated. She is asking for referral to a nutrition specialist. We have provided her a referral to Karenann Caimy Hager -- she should be contacted within a few weeks to schedule appointment. Mrs. Melinda Krause dual purpose as nutritionist as well as the "life coach " who can assist with recommendations for exercises.

## 2014-04-03 NOTE — Assessment & Plan Note (Addendum)
Recently started losartan. She was supposed to present back to her PCP shortly for BP check and labs. I think the liberty to go ahead and increase the losartan to 50 mg daily. Prior realizing the she was post due to have labs checked by her PCP, I ordered a chemistry panel to be done along with lipids.   This is to evaluate for the recent start of an ARB.  Discussed important cofactors including appropriate treatment of OSA and weight loss. At present, I am not inclined to check renal Doppler unless we are unable to get her blood pressures controlled

## 2014-04-03 NOTE — Assessment & Plan Note (Signed)
This could also be multifactorial, she certainly is deconditioned having not exercised any lately. She is morbidly obese and has significant hypertension.  With all of her risk factors I am concerned about the possibility of either hypertensive cardiomyopathy/heart disease versus the potential for ischemic CAD.  Plan: 2-D echocardiogram and LexiScan Myoview (do not want her to stop 200 mg of Toprol for the stress test, and she does not think she can make it very long the treadmill due to her arthritis pains)

## 2014-04-05 ENCOUNTER — Telehealth: Payer: Self-pay | Admitting: Family

## 2014-04-05 NOTE — Telephone Encounter (Signed)
Patient states that she saw Dr. 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 Patient states that this gives her muscle spasms and would like to know if Melissa would prescribe her vicoden and flexeril?

## 2014-04-06 MED ORDER — CYCLOBENZAPRINE HCL 5 MG PO TABS: ORAL_TABLET | ORAL | Status: DC

## 2014-04-06 NOTE — Telephone Encounter (Signed)
Left detailed message on pt's cell#, flexeril refill sent.

## 2014-04-06 NOTE — Telephone Encounter (Signed)
Please advise 

## 2014-04-06 NOTE — Telephone Encounter (Signed)
I sent flexeril last month- OK to continue flexeril.  I am not comfortable giving her vicoden for this.  I would recommend that she follow through with split night sleep study and follow up with  Psychiatry as recommended by Dr. Richardean Chimeraohmeir.

## 2014-04-09 ENCOUNTER — Institutional Professional Consult (permissible substitution): Payer: Self-pay | Admitting: Internal Medicine

## 2014-04-10 ENCOUNTER — Encounter: Payer: Self-pay | Admitting: Family

## 2014-04-10 ENCOUNTER — Telehealth: Payer: Self-pay | Admitting: Neurology

## 2014-04-10 DIAGNOSIS — Z0289 Encounter for other administrative examinations: Secondary | ICD-10-CM

## 2014-04-10 DIAGNOSIS — G4733 Obstructive sleep apnea (adult) (pediatric): Secondary | ICD-10-CM

## 2014-04-10 NOTE — Telephone Encounter (Signed)
Hypertension and exertional dyspnea- she needs attended sleep study with CO2 . See BMI.

## 2014-04-10 NOTE — Telephone Encounter (Signed)
Would you like to set up a peer to peer on Tuesday or Thursday during read time?

## 2014-04-10 NOTE — Telephone Encounter (Signed)
Attended sleep study was denied by [facility] [employer].  They could find no justification that pt could not proceed with Auto titration.  Please advise if you would like peer to peer or please proceed with order for auto titration.

## 2014-04-13 ENCOUNTER — Telehealth (HOSPITAL_COMMUNITY): Payer: Self-pay

## 2014-04-13 NOTE — Telephone Encounter (Signed)
[employer] has denied the patient for an attended sleep study, need an order for auto titration unless a peer to peer will be requested.

## 2014-04-13 NOTE — Telephone Encounter (Signed)
Thursday AM Peer to peer.

## 2014-04-17 ENCOUNTER — Telehealth (HOSPITAL_COMMUNITY): Payer: Self-pay

## 2014-04-18 ENCOUNTER — Telehealth: Payer: Self-pay | Admitting: *Deleted

## 2014-04-18 ENCOUNTER — Ambulatory Visit (HOSPITAL_COMMUNITY)
Admission: RE | Admit: 2014-04-18 | Discharge: 2014-04-18 | Disposition: A | Discharge status: Home / Self Care | Payer: BC Managed Care – PPO | Source: Ambulatory Visit | Attending: Internal Medicine | Admitting: Internal Medicine

## 2014-04-18 ENCOUNTER — Ambulatory Visit (HOSPITAL_BASED_OUTPATIENT_CLINIC_OR_DEPARTMENT_OTHER)
Admission: RE | Admit: 2014-04-18 | Discharge: 2014-04-18 | Disposition: A | Discharge status: Home / Self Care | Payer: BC Managed Care – PPO | Source: Ambulatory Visit | Attending: Internal Medicine | Admitting: Internal Medicine

## 2014-04-18 DIAGNOSIS — R0600 Dyspnea, unspecified: Secondary | ICD-10-CM

## 2014-04-18 DIAGNOSIS — R0989 Other specified symptoms and signs involving the circulatory and respiratory systems: Secondary | ICD-10-CM

## 2014-04-18 DIAGNOSIS — R5381 Other malaise: Secondary | ICD-10-CM | POA: Insufficient documentation

## 2014-04-18 DIAGNOSIS — R079 Chest pain, unspecified: Secondary | ICD-10-CM

## 2014-04-18 DIAGNOSIS — R0609 Other forms of dyspnea: Secondary | ICD-10-CM

## 2014-04-18 DIAGNOSIS — I10 Essential (primary) hypertension: Secondary | ICD-10-CM

## 2014-04-18 DIAGNOSIS — R5383 Other fatigue: Secondary | ICD-10-CM | POA: Insufficient documentation

## 2014-04-18 DIAGNOSIS — R0602 Shortness of breath: Secondary | ICD-10-CM

## 2014-04-18 DIAGNOSIS — R002 Palpitations: Secondary | ICD-10-CM | POA: Insufficient documentation

## 2014-04-18 DIAGNOSIS — R42 Dizziness and giddiness: Secondary | ICD-10-CM | POA: Insufficient documentation

## 2014-04-18 HISTORY — PX: TRANSTHORACIC ECHOCARDIOGRAM: SHX275

## 2014-04-18 HISTORY — PX: NM MYOVIEW LTD: HXRAD82

## 2014-04-18 MED ORDER — TECHNETIUM TC 99M SESTAMIBI GENERIC - CARDIOLITE
10.8000 | Freq: Once | INTRAVENOUS | Status: AC | PRN
  Administered 2014-04-18: 11 via INTRAVENOUS

## 2014-04-18 MED ORDER — AMINOPHYLLINE 25 MG/ML IV SOLN
75.0000 mg | Freq: Once | INTRAVENOUS | Status: AC
Start: 2014-04-18 — End: 2014-04-18
  Administered 2014-04-18: 75 mg via INTRAVENOUS

## 2014-04-18 MED ORDER — TECHNETIUM TC 99M SESTAMIBI GENERIC - CARDIOLITE
31.2000 | Freq: Once | INTRAVENOUS | Status: AC | PRN
  Administered 2014-04-18: 31.2 via INTRAVENOUS

## 2014-04-18 MED ORDER — REGADENOSON 0.4 MG/5ML IV SOLN
0.4000 mg | Freq: Once | INTRAVENOUS | Status: AC
  Administered 2014-04-18: 0.4 mg via INTRAVENOUS

## 2014-04-18 NOTE — Procedures (Addendum)
[facility] [address] [place] [address] [phone]  Cardiology Nuclear Med Study  Melinda Krause is a 53 y.o. female     MRN : [id number]     DOB: [birth year]  Procedure Date: 04/18/2014  Nuclear Med Background Indication for Stress Test:  Evaluation for Ischemia History:  No prior cardiac or respiratory history reported. Last NUC MPI in 2006;  Cardiac Risk Factors: Family History - CAD, Hypertension, Lipids and Obesity  Symptoms:  Chest Pain, Dizziness, DOE, Fatigue, Light-Headedness, Palpitations, SOB and abdominal pain;weakness   Nuclear Pre-Procedure Caffeine/Decaff Intake:  7:00pm NPO After: 5:00am   IV Site: R Forearm  IV 0.9% NS with Angio Cath:  22g  Chest Size (in):  n/a IV Started by: Berdie Ogren, RN  Height: 5\' 5"  (1.651 m)  Cup Size: D  BMI:  Body mass index is 46.59 kg/(m^2). Weight:  280 lb (127.007 kg)   Tech Comments:  n/a    Nuclear Med Study 1 or 2 day study: 1 day  Stress Test Type:  Lexiscan  Order Authorizing Provider:  Bryan Lemma, MD   Resting Radionuclide: Technetium 40m Sestamibi  Resting Radionuclide Dose: 10.8 mCi   Stress Radionuclide:  Technetium 67m Sestamibi  Stress Radionuclide Dose: 31.2 mCi           Stress Protocol Rest HR: 78 Stress HR: 95  Rest BP: 151/90 Stress BP: 154/85  Exercise Time (min): n/a METS: n/a   Predicted Max HR: 169 bpm % Max HR: 56.21 bpm Rate Pressure Product: 71062  Dose of Adenosine (mg):  n/a Dose of Lexiscan: 0.4 mg  Dose of Atropine (mg): n/a Dose of Dobutamine: n/a mcg/kg/min (at max HR)  Stress Test Technologist: Esperanza Sheets, CCT Nuclear Technologist: Gonzella Lex, CNMT   Rest Procedure:  Myocardial perfusion imaging was performed at rest 45 minutes following the intravenous administration of Technetium 73m Sestamibi. Stress Procedure:  The patient received IV Lexiscan 0.4 mg over 15-seconds.  Technetium 62m Sestamibi injected  IV at 30-seconds.  Patient experienced SOB and Nausea and 75 mg of Aminophylline IV was administered at 5 minutes.  There were no significant changes with Lexiscan.  Quantitative spect images were obtained after a 45 minute delay.  Transient Ischemic Dilatation (Normal <1.22):  0.85 Lung/Heart Ratio (Normal <0.45):  0.20 QGS EDV:  61 ml QGS ESV:  24 ml LV Ejection Fraction: 60%  Rest ECG: NSR - Normal EKG  Stress ECG: No significant change from baseline ECG  QPS Raw Data Images:  Normal; no motion artifact; normal heart/lung ratio. Stress Images:  Normal homogeneous uptake in all areas of the myocardium. Rest Images:  Normal homogeneous uptake in all areas of the myocardium. Subtraction (SDS):  Normal  Impression Exercise Capacity:  Lexiscan with no exercise. BP Response:  Normal blood pressure response. Clinical Symptoms:  No significant symptoms noted. ECG Impression:  No significant ECG changes with Lexiscan. Comparison with Prior Nuclear Study: No images to compare  Overall Impression:  Normal stress nuclear study.  LV Wall Motion:  NL LV Function; NL Wall Motion; EF 60%.  [name], MD, [facility] Board Certified in Nuclear Cardiology Attending Cardiologist [facility]  [name], MD  04/18/2014 12:02 PM

## 2014-04-18 NOTE — Telephone Encounter (Signed)
Left message to call back will give results  

## 2014-04-18 NOTE — Progress Notes (Signed)
2D Echocardiogram Complete.  04/18/2014   Mysha Peeler, RDCS 

## 2014-04-18 NOTE — Progress Notes (Signed)
Quick Note:  Stress Test looked good!! No sign of significant Heart Artery Disease. Pump function is normal.  Good news!!.  HARDING,DAVID W, MD  ______ 

## 2014-04-18 NOTE — Telephone Encounter (Signed)
Spoke to patient. Result given . Verbalized understanding  

## 2014-04-18 NOTE — Telephone Encounter (Signed)
Message copied by Tobin Chad on Wed Apr 18, 2014  2:24 PM ------      Message from: Marykay Lex      Created: Wed Apr 18, 2014  1:37 PM       Stress Test looked good!! No sign of significant Heart Artery Disease.  Pump function is normal.            Good news!!.            Marykay Lex, MD       ------

## 2014-04-18 NOTE — Telephone Encounter (Signed)
Patient returned your call.

## 2014-04-19 NOTE — Progress Notes (Signed)
Quick Note:  Echo results: Good news: Essentially normal echocardiogram and normal pump function and normal valve function. No signs to suggest heart attack.. EF: 55-60%. No regional wall motion abnormalities   ______ 

## 2014-04-20 ENCOUNTER — Telehealth: Payer: Self-pay | Admitting: *Deleted

## 2014-04-20 NOTE — Telephone Encounter (Signed)
Dr. Vickey Huger, we are having trouble arranging the peer to peer with [facility] because their physicians make call attempts but do not leave contact information.  I have tried to set this up three times without success.  Would you submit an order for auto titration?  Dirk Dress, is going to meet with the patient to assist with her intolerance of CPAP.

## 2014-04-20 NOTE — Telephone Encounter (Signed)
Patient can be setup for autotitration.  She has had an interesting reaction to CPAP in the past and may not be successful without some outside assistance.  She has had past experience with CPAP but discontinued.  She may be more committed to treatment this time, it sounds like she experienced some special challenges which I can't explain such as swelling in the facial region.  I will contact the patient and let her know we can work on desensitization with her if needed if she experiences those same issues once she starts the auto-titrator.

## 2014-04-20 NOTE — Telephone Encounter (Signed)
Message copied by Tobin Chad on Fri Apr 20, 2014  8:13 AM ------      Message from: Marykay Lex      Created: Thu Apr 19, 2014  5:43 PM       Echo results:      Good news: Essentially normal echocardiogram and normal pump function and normal valve function.  No signs to suggest heart attack..      EF: 55-60%.      No regional wall motion abnormalities             ------

## 2014-04-20 NOTE — Telephone Encounter (Signed)
Left message of results. Release on MY CHART. ANY QUESTION MAY CALL BACK

## 2014-04-23 ENCOUNTER — Telehealth: Payer: Self-pay | Admitting: Neurology

## 2014-04-23 NOTE — Telephone Encounter (Signed)
Pt's [employer] did not approve her sleep study.  An order for auto titration has been submitted to [facility] for processing.

## 2014-05-08 ENCOUNTER — Ambulatory Visit (INDEPENDENT_AMBULATORY_CARE_PROVIDER_SITE_OTHER): Payer: BC Managed Care – PPO | Admitting: Cardiology

## 2014-05-08 VITALS — BP 171/90 | HR 69 | Ht 64.0 in | Wt 285.4 lb

## 2014-05-08 DIAGNOSIS — Z9119 Patient's noncompliance with other medical treatment and regimen: Secondary | ICD-10-CM

## 2014-05-08 DIAGNOSIS — IMO0001 Reserved for inherently not codable concepts without codable children: Secondary | ICD-10-CM

## 2014-05-08 DIAGNOSIS — Z91199 Patient's noncompliance with other medical treatment and regimen due to unspecified reason: Secondary | ICD-10-CM

## 2014-05-08 DIAGNOSIS — R0989 Other specified symptoms and signs involving the circulatory and respiratory systems: Secondary | ICD-10-CM

## 2014-05-08 DIAGNOSIS — Z79899 Other long term (current) drug therapy: Secondary | ICD-10-CM

## 2014-05-08 DIAGNOSIS — I10 Essential (primary) hypertension: Secondary | ICD-10-CM

## 2014-05-08 DIAGNOSIS — R0600 Dyspnea, unspecified: Secondary | ICD-10-CM

## 2014-05-08 DIAGNOSIS — R079 Chest pain, unspecified: Secondary | ICD-10-CM

## 2014-05-08 DIAGNOSIS — G4733 Obstructive sleep apnea (adult) (pediatric): Secondary | ICD-10-CM

## 2014-05-08 DIAGNOSIS — R0609 Other forms of dyspnea: Secondary | ICD-10-CM

## 2014-05-08 DIAGNOSIS — M797 Fibromyalgia: Secondary | ICD-10-CM

## 2014-05-08 DIAGNOSIS — E785 Hyperlipidemia, unspecified: Secondary | ICD-10-CM

## 2014-05-08 DIAGNOSIS — Z6841 Body Mass Index (BMI) 40.0 and over, adult: Secondary | ICD-10-CM

## 2014-05-08 MED ORDER — CHLORTHALIDONE 50 MG PO TABS: 50.0000 mg | ORAL_TABLET | Freq: Every day | ORAL | Status: DC

## 2014-05-08 MED ORDER — LOSARTAN POTASSIUM 100 MG PO TABS: 100.0000 mg | ORAL_TABLET | Freq: Every day | ORAL | Status: AC

## 2014-05-08 NOTE — Patient Instructions (Signed)
INCREASE LOSARTAN TO 100 MG DAILY  START CHLORTHALIDONE 50 MG DAILY -- FOR THE FIRST  WEEK TAKE 1/2 TABLET A DAY THEN INCREASE.   Your physician has requested that you have a renal artery duplex. During this test, an ultrasound is used to evaluate blood flow to the kidneys. Allow one hour for this exam. Do not eat after midnight the day before and avoid carbonated beverages. Take your medications as you usually do. ON THE SAME DAY YOU WILL HAVE A BLOOD PRESSURE CHECK - NURSE VISIT  CHECK LAB 7-10 DAYS AFTER THE START OF THE NEW MEDICATION CHANGE.   Your physician wants you to follow-up in 4 MONTHS Dr Herbie BaltimoreHARDING. You will receive a reminder letter in the mail two months in advance. If you don't receive a letter, please call our office to schedule the follow-up appointment.

## 2014-05-09 ENCOUNTER — Telehealth (HOSPITAL_COMMUNITY): Payer: Self-pay | Admitting: *Deleted

## 2014-05-10 ENCOUNTER — Telehealth: Payer: Self-pay | Admitting: *Deleted

## 2014-05-10 NOTE — Telephone Encounter (Signed)
Called and LM for patient to check on her progress with auto-titration.  Also explained I wanted to offer desensitization or mask fitting if she felt it would benefit her.  Told her to call me if she had any questions or concerns.

## 2014-05-14 ENCOUNTER — Other Ambulatory Visit: Payer: Self-pay | Admitting: Family

## 2014-05-15 NOTE — Telephone Encounter (Signed)
Cyclobenzaprine sent in for # 30 no refills.

## 2014-05-15 NOTE — Telephone Encounter (Signed)
Refill request for cyclobenzaprine Last filled by MD on - 04/04/2014 #30 x0 Last Appt:03/20/2014 Next Appt:none Please advise refill?

## 2014-05-15 NOTE — Telephone Encounter (Signed)
OK to send 30 tabs zero refills.  

## 2014-05-16 ENCOUNTER — Other Ambulatory Visit: Payer: BC Managed Care – PPO | Admitting: *Deleted

## 2014-05-16 NOTE — Telephone Encounter (Signed)
Melinda Krause called 05/15/14 and LM, I returned her call today and we have arranged to meet tomorrow 05/17/14 in order to evaluate and desensitize if needed.  She will arrive at 3 PM.  She has been instructed to bring her machine and mask if possible.  She states in her message that [facility] and she is not sure if she needs to switch providers, not a lot of details given there as to why she wants to switch, but I will ask when we meet.  She mentions she has had a lot of "anatomy issues in the past and wonders if they are getting worse."  She may need another appt with Dr. Vickey Hugerohmeier but we will evaluate and see what info I can pass along prior to appt at least.

## 2014-05-17 ENCOUNTER — Other Ambulatory Visit: Payer: BC Managed Care – PPO | Admitting: *Deleted

## 2014-05-17 ENCOUNTER — Encounter: Payer: Self-pay | Admitting: Cardiology

## 2014-05-17 NOTE — Assessment & Plan Note (Addendum)
Increase losartan to 100 mg daily. Convert hydrochlorothiazide to chlorthalidone 25 mg with plan to increase to 50 mg. This has a more prolonged duration of therapy. Next choice would be to add calcium channel blocker such as amlodipine or felodipine.  Check BMP in 2 weeks. Will check renal Dopplers to assess for possible renovascular hypertension I am quite sure that her obesity is contributing. Unfortunately with her being on diuretic and ARB already, we cannot check for renin and aldosterone levels.

## 2014-05-17 NOTE — Assessment & Plan Note (Signed)
Probably multifactorial but not as likely to be microvascular ischemia related. Plan is to increase diet and exercise and plans to lose weight and hopefully improve exertional dyspnea as she improves her condition.

## 2014-05-17 NOTE — Assessment & Plan Note (Signed)
Needs adjustment of her settings she can get back on CPAP.

## 2014-05-17 NOTE — Assessment & Plan Note (Signed)
I wouldn't be surprised if her different chest pain symptoms could potentially be related to fibromyalgia. Defer treatment of her primary service.

## 2014-05-17 NOTE — Assessment & Plan Note (Signed)
Due for followup labs in September. Hopefully with weight loss and diet/exercise she can improve her overall lipid level. Her LDL is less than 100 which is essentially at goal.

## 2014-05-17 NOTE — Assessment & Plan Note (Signed)
Negative stress test. Relatively reassuring result. Still must consider microvascular ischemia. Improved blood pressure as well as glycemic and lipid control. Diet exercise to increase exercise tolerance and weight loss.

## 2014-05-17 NOTE — Assessment & Plan Note (Signed)
She is intending to start her nutrition consultation with Karenann CaiAmy Hager. She fully intends to work or numbness weight loss concept. Has had a negative stress test, she feels comfortable with proceeding with an exercise regimen. I recommended a gradual approach to weight loss with the plan to lose 1/10 of her body weight = roughly 28-29 pounds in the first year.

## 2014-05-17 NOTE — Progress Notes (Signed)
PATIENT: Melinda Krause MRN: [id number] DOB: 09/18/1961 PCP: Lem[place] Fillers'Krause,Melinda S., NP  Clinic Note: Self referral Chief Complaint  Patient presents with  . Follow-up    follow-up stress test and Echo, pt c/o of chest discomfort   HPI: Melinda Krause is a 53 y.o. morbidly obese female with a PMH below who presents today for evaluation treatment of hypertension who also complains of dyspnea and chest discomfort. She is the daughter of for a patient of Dr. Jinny BlossomAl Krause then passed away a few years ago for palpitations from long-standing ischemic cardiomyopathy.  She states her overall for the past year or so she's had a lot of difficulty with her health. I saw her last month to evaluate chest pain and exertional dyspnea. She had a Myoview and echocardiogram done with results noted below. Both were essentially normal.  Interval History: Since her last visit, she is overall feeling a little bit better. All having intermittent episodes of chest discomfort that at times is made worse with deep inspiration. It does not seem to be necessarily exertional. It is described as sharp and occasionally dull. They're several different locations including a sense of band around her chest. She denies any PND, orthopnea or edema.   She denies any rapid or irregular heartbeats or palpitations -- she is on 200 mg a day of Toprol. No syncope or near-syncope. No TIA or amaurosis fugax symptoms. She denies any melena hematochezia or hematuria. No claudication symptoms. No significant myalgias but she does have arthralgias.  She has seen Dr. Vickey Krause from Neurology earlier today. She is also followed by Dr[facility] Mellody DanceKeith [facility]. She sees Dr. Corliss Krause for her fibromyalgia and dry eyes.   Past Medical History  Diagnosis Date  . History of chicken pox     childhood  . Depression     [facility]  . Headache(784.0)     daily  . Peptic ulcer   . Seasonal  allergies   . Migraine     1-2 per month  . Hypothyroidism   . Fibromyalgia     Rheumatologist- Dr. Ardis HughsShali Krause  . Hypertension   . Hyperlipidemia   . Pain in joint, shoulder region 04/02/2013    left  . Neck injury     C-5-6  . Non compliance with medical treatment 04/02/2014  . Morbid obesity with BMI of 45.0-49.9, adult     Prior Cardiac Evaluation and Past Surgical History: Procedure Laterality Date  . Nm myoview ltd  04/18/2014    LexiScan: EF 60%; no evidence of ischemia or infarction. Normal stress test to  . Transthoracic echocardiogram  04/18/2014    EF 55-60%. Difficult to assess for regional wall motion due to poor quality.  Normal diastolic pressures/function. No significant valvular lesions.    Allergies  Allergen Reactions  . Demerol     Irritability, and shortness of breath   Current Outpatient Prescriptions on File Prior to Visit  Medication Sig Dispense Refill  . clonazePAM (KLONOPIN) 0.5 MG tablet Take 1 tablet (0.5 mg total) by mouth 2 (two) times daily. For anxiety.  30 tablet  0  . esomeprazole (NEXIUM) 40 MG capsule Take 1 capsule (40 mg total) by mouth daily before breakfast. For GERD      . FERROUS SULFATE PO 1 PO QD W/VIT C-TO INCREASE ABSORPTION      . hydrochlorothiazide (HYDRODIURIL) 25 MG tablet Take 1 tablet (25 mg total) by mouth daily.  30 tablet  2  .  levothyroxine (SYNTHROID, LEVOTHROID) 150 MCG tablet TAKE 1 TABLET (150 MCG TOTAL) BY MOUTH DAILY.  90 tablet  0  . loratadine (CLARITIN) 10 MG tablet Take 10 mg by mouth daily as needed for allergies.      . metoprolol succinate (TOPROL-XL) 100 MG 24 hr tablet Take 2 tablets (200 mg total) by mouth daily. Take with or immediately following a meal.  60 tablet  2  . Multiple Vitamins-Minerals (MULTIVITAMIN PO) Take 1 tablet by mouth daily.      . naproxen sodium (ANAPROX) 220 MG tablet Take 220 mg by mouth 2 (two) times daily with a meal.      . venlafaxine XR (EFFEXOR-XR) 150 MG 24 hr capsule  Take 1 capsule (150 mg total) by mouth daily with breakfast. For anxiety and depression.  30 capsule  0  . zolpidem (AMBIEN CR) 12.5 MG CR tablet Take 12.5 mg by mouth at bedtime as needed for sleep.       No current facility-administered medications on file prior to visit.    History   Social History Narrative   Patient is married Melinda Krause) and lives at home with her husband. Three children.   Patient has a Bachelor's degree.  Is currently employed.   Patient is right-handed.   Patient drinks 2-3 Diet sodas daily.   Does not Drink EtOH or smoke   Family History: family history includes Allergies in her mother; Arrhythmia in her son; Asthma in her mother; Cancer in her maternal grandmother and mother; Emphysema in her mother; Heart disease in her father, mother, paternal grandfather, and paternal grandmother; Hypertension in her father, mother, paternal grandmother, and son; Rheum arthritis in her maternal grandfather; Stroke in her paternal grandmother.  ROS: A comprehensive Review of Systems - extensively positive as indicated above. She has a severely elevated Epworth score, severe fatigue. She was told she had Graves' disease by the neurologist, but is actually on Synthroid now. She has multiple social stressors including caring for her elderly parents. she has not been as compliant with CPAP because it "sn't working right for her".    PHYSICAL EXAM BP 171/90  Pulse 69  Ht 5\' 4"  (1.626 m)  Wt 285 lb 6.4 oz (129.457 kg)  BMI 48.96 kg/m2 General appearance: alert, cooperative, appears stated age and Anxious, morbidly obese. NAD. Well groomed and answered questions appropriately. Neck: no adenopathy, no carotid bruit, no JVD, supple, symmetrical, trachea midline and Really difficult to assess JVP due to the neck circumference. Lungs: clear to auscultation bilaterally, normal percussion bilaterally and Nonlabored, good air movement Heart: Very distant heart sounds but sounds like normal  S1-S2. Cannot exclude an S4 gallop. Cannot palpate PMI. No other M/R./G. noted. Abdomen: soft, non-tender; bowel sounds normal; no masses,  no organomegaly and Significantly obese. Extremities: Trace pedal edema versus simply puffy obese legs. She has mild varicosities but no major stasis changes. Pulses: 2+ and symmetric Neurologic: Mental status: Alert, oriented, thought content appropriate, affect: increased in intensity Cranial nerves: normal   Adult ECG Report  Rate:  69 ;  Rhythm: normal sinus rhythm; normal axis, voltage and intervals.  Narrative Interpretation:  normal EKG  Recent Labs:  reviewed in Epic. Last lipid panel was in September of 2014.  ASSESSMENT / PLAN: Very pleasant morbidly obese woman with multiple features making it the diagnosis of metabolic syndrome who was evaluated for chest discomfort with a negative Myoview. She also had a relatively normal echocardiogram. It is still possible that she has some  diastolic dysfunction based on the poor quality of the echo. However did not seem to be that significant. The remaining potential cardiac etiology would be microvascular ischemia. The optimal treatment course for this would be blood pressure control and other cardiovascular risk factor modification.  Chest pain with moderate risk for cardiac etiology Negative stress test. Relatively reassuring result. Still must consider microvascular ischemia. Improved blood pressure as well as glycemic and lipid control. Diet exercise to increase exercise tolerance and weight loss.  Hypertension - poorly controlled on multiple medications Increase losartan to 100 mg daily. Convert hydrochlorothiazide to chlorthalidone 25 mg with plan to increase to 50 mg. This has a more prolonged duration of therapy. Next choice would be to add calcium channel blocker such as amlodipine or felodipine.  Check BMP in 2 weeks. Will check renal Dopplers to assess for possible renovascular hypertension I  am quite sure that her obesity is contributing. Unfortunately with her being on diuretic and ARB already, we cannot check for renin and aldosterone levels.  OSA (obstructive sleep apnea) Needs adjustment of her settings she can get back on CPAP.  Morbid obesity with BMI of 45.0-49.9, adult She is intending to start her nutrition consultation with Karenann CaiAmy Hager. She fully intends to work or numbness weight loss concept. Has had a negative stress test, she feels comfortable with proceeding with an exercise regimen. I recommended a gradual approach to weight loss with the plan to lose 1/10 of her body weight = roughly 28-29 pounds in the first year.  Hyperlipidemia Due for followup labs in September. Hopefully with weight loss and diet/exercise she can improve her overall lipid level. Her LDL is less than 100 which is essentially at goal.  Exertional dyspnea Probably multifactorial but not as likely to be microvascular ischemia related. Plan is to increase diet and exercise and Krause to lose weight and hopefully improve exertional dyspnea as she improves her condition.  Non compliance with medical treatment I stressed the importance of intramedullary instability no for sure what were treating, and that we're treating it accurately.  Fibromyalgia I wouldn't be surprised if her different chest pain symptoms could potentially be related to fibromyalgia. Defer treatment of her primary service.    Orders Placed This Encounter  Procedures  . Basic metabolic panel    Order Specific Question:  Has the patient fasted?    Answer:  No  . Renal Artery Duplex Bilateral    Standing Status: Future     Number of Occurrences:      Standing Expiration Date: 05/08/2015    Scheduling Instructions:     Dx hypertension    Order Specific Question:  Laterality    Answer:  Bilateral    Order Specific Question:  Where should this test be performed:    Answer:  [facility]   Meds ordered this encounter    Medications  . Cholecalciferol (VITAMIN D-1000 MAX ST) 1000 UNITS tablet    Sig: Take 1 tablet by mouth daily.  Marland Kitchen. DISCONTD: esomeprazole (NEXIUM) 40 MG capsule    Sig: Take 1 capsule by mouth daily.  . chlorthalidone (HYGROTON) 50 MG tablet    Sig: Take 1 tablet (50 mg total) by mouth daily.    Dispense:  30 tablet    Refill:  11  . losartan (COZAAR) 100 MG tablet    Sig: Take 1 tablet (100 mg total) by mouth daily.    Dispense:  30 tablet    Refill:  11  Followup:  4 months;  nurse visit in one week for blood pressure check while coming to get labs.   DAVID W. Herbie Baltimore, M.D., M.S. Interventional Cardiolgy [facility]

## 2014-05-17 NOTE — Assessment & Plan Note (Signed)
I stressed the importance of intramedullary instability no for sure what were treating, and that we're treating it accurately.

## 2014-05-21 ENCOUNTER — Telehealth (HOSPITAL_COMMUNITY): Payer: Self-pay | Admitting: *Deleted

## 2014-05-23 NOTE — Telephone Encounter (Signed)
Encounter complete. 

## 2014-05-28 ENCOUNTER — Telehealth (HOSPITAL_COMMUNITY): Payer: Self-pay | Admitting: *Deleted

## 2014-05-31 NOTE — Telephone Encounter (Signed)
Encounter complete. 

## 2014-06-04 ENCOUNTER — Telehealth (HOSPITAL_COMMUNITY): Payer: Self-pay | Admitting: *Deleted

## 2014-06-07 ENCOUNTER — Other Ambulatory Visit: Payer: Self-pay | Admitting: Family

## 2014-06-07 NOTE — Telephone Encounter (Signed)
Encounter complete. 

## 2014-06-07 NOTE — Telephone Encounter (Signed)
[facility]Scribe request from [facility] on Cyclobenzaprine 5 mg Last filled - 06.23.15, #30x0 Last AEX - 04.28.15 Next AEX - 2 Weeks for CPE & BP check; pt No Show for 05.19.15 appt, no further appointment scheduled Please Advise on refills/SLS

## 2014-06-07 NOTE — Telephone Encounter (Signed)
Refused      Disp Refills Start End    cyclobenzaprine (FLEXERIL) 5 MG tablet [Pharmacy Med Name: CYCLOBENZAPRINE 5 MG TABLET] 30 tablet 0 06/07/2014     Sig: TAKE 1 TABLET BY MOUTH 3 TIMES A DAY AS NEEDED FOR MUSCLE SPASM    Class: Normal    DAW: No    Reason for Refusal: Change not appropriate    Reason for Refusal Comment: needs office visit    Refused By: Sandford CrazeMelissa O'Sullivan, NP    Please call patient and schedule Office Visit prior to medication refills per Melissa/SLS Thanks.

## 2014-06-08 NOTE — Telephone Encounter (Signed)
Left detailed message informing patient of medication refill and that she needs to be seen before further refills can be given. °

## 2014-06-09 NOTE — Telephone Encounter (Signed)
Left message for patient to return my call.

## 2014-06-11 NOTE — Telephone Encounter (Signed)
Left message for patient to return my call.

## 2014-06-12 ENCOUNTER — Telehealth (HOSPITAL_COMMUNITY): Payer: Self-pay | Admitting: *Deleted

## 2014-06-14 ENCOUNTER — Telehealth (HOSPITAL_COMMUNITY): Payer: Self-pay | Admitting: *Deleted

## 2014-06-18 ENCOUNTER — Ambulatory Visit: Payer: Self-pay | Admitting: Family

## 2014-06-18 DIAGNOSIS — Z0289 Encounter for other administrative examinations: Secondary | ICD-10-CM

## 2014-06-28 ENCOUNTER — Other Ambulatory Visit: Payer: Self-pay | Admitting: Family

## 2014-06-29 NOTE — Telephone Encounter (Signed)
Do you want this rx refilled? 

## 2014-07-09 ENCOUNTER — Other Ambulatory Visit: Payer: Self-pay | Admitting: Family

## 2014-07-17 ENCOUNTER — Ambulatory Visit (INDEPENDENT_AMBULATORY_CARE_PROVIDER_SITE_OTHER): Payer: BC Managed Care – PPO | Admitting: Family

## 2014-07-17 ENCOUNTER — Encounter: Payer: Self-pay | Admitting: Family

## 2014-07-17 VITALS — BP 164/104 | HR 83 | Temp 97.9°F | Resp 18 | Ht 64.0 in

## 2014-07-17 DIAGNOSIS — F32A Depression, unspecified: Secondary | ICD-10-CM

## 2014-07-17 DIAGNOSIS — F3289 Other specified depressive episodes: Secondary | ICD-10-CM

## 2014-07-17 DIAGNOSIS — Z23 Encounter for immunization: Secondary | ICD-10-CM

## 2014-07-17 DIAGNOSIS — I10 Essential (primary) hypertension: Secondary | ICD-10-CM

## 2014-07-17 DIAGNOSIS — F329 Major depressive disorder, single episode, unspecified: Secondary | ICD-10-CM

## 2014-07-17 MED ORDER — OMEPRAZOLE 40 MG PO CPDR: 40.0000 mg | DELAYED_RELEASE_CAPSULE | Freq: Every day | ORAL | Status: DC

## 2014-07-17 MED ORDER — BUPROPION HCL ER (SR) 150 MG PO TB12: ORAL_TABLET | ORAL | Status: DC

## 2014-07-17 MED ORDER — AMLODIPINE BESYLATE 5 MG PO TABS: 5.0000 mg | ORAL_TABLET | Freq: Every day | ORAL | Status: DC

## 2014-07-17 MED ORDER — METOPROLOL SUCCINATE ER 100 MG PO TB24: 200.0000 mg | ORAL_TABLET | Freq: Every day | ORAL | Status: DC

## 2014-07-17 MED ORDER — CLONIDINE HCL 0.1 MG PO TABS
0.1000 mg | ORAL_TABLET | Freq: Once | ORAL | Status: AC
  Administered 2014-07-17: 0.1 mg via ORAL

## 2014-07-17 NOTE — Progress Notes (Signed)
Subjective:    Patient ID: Melinda Krause, female    DOB: 1961-04-01, 53 y.o.   MRN: [id number]  HPI  Melinda Krause is a 53 yr old female who presents today for follow up.  1) Depression- She follows with psychiatry. She is maintained on effexor.  Has followed with Starling Manns at [facility].  Reports that she is taking meds.  Reports that she is not suicidal. Feels withdrawn.  Has not left the house in 1 month. Not sleeping well, has not followed up with pulmonology. Stopped ambien and clonopin.  Uses melatonin but still wakes up every 90 minutes with nightmares. She is currently on effexor. 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   2) HTN- stopped chlorthalidone due to "back pain."   BP Readings from Last 3 Encounters:  07/17/14 190/100  05/08/14 171/90  04/02/14 160/98   She is currently on losartan, hctz, metoprolol- ran out of metoprolol yesterday.     Review of Systems See HPI  Past Medical History  Diagnosis Date  . History of chicken pox     childhood  . Depression     Counseling-Dr [facility]  . Headache(784.0)     daily  . Peptic ulcer   . Seasonal allergies   . Migraine     1-2 per month  . Hypothyroidism   . Fibromyalgia     Rheumatologist- Dr. Ardis Hughs  . Hypertension   . Hyperlipidemia   . Pain in joint, shoulder region 04/02/2013    left  . Neck injury     C-5-6  . Non compliance with medical treatment 04/02/2014  . Morbid obesity with BMI of 45.0-49.9, adult     History   Social History  . Marital Status: Married    Spouse Name: Melinda Krause    Number of Children: 3  . 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 Smoking status: Never Smoker   . Smokeless tobacco: Never Used  . Alcohol Use: No  . Drug Use: No  . Sexual Activity: Yes    Birth Control/ Protection: None   Other Topics  Concern  . Not on file   Social History Narrative   Patient is married Melinda Krause) and lives at home with her husband. Three children.   Patient has a Bachelor's degree.  Is currently employed.   Patient is right-handed.   Patient drinks 2-3 Diet sodas daily.   Does not Drink EtOH or smoke    Past Surgical History  Procedure Laterality Date  . Vaginal hysterectomy  2003  . Cesarean section  1989  . Rectocele repair  2009  . Vagina reconstruction surgery  2011    vaginal mesh repair  . Colonoscopy    . Upper gastrointestinal endoscopy    . Removal of vaginal mesh  11/2012  . Nm myoview ltd  04/18/2014    LexiScan: EF 60%; no evidence of ischemia or infarction. Normal stress test to  . Transthoracic echocardiogram  04/18/2014    EF 55-60%. Difficult to assess for regional wall motion due to poor quality.  Normal diastolic pressures/function. No significant valvular lesions.    Family History  Problem Relation Age of Onset  . Emphysema Mother   . Allergies Mother   . Asthma Mother   . Cancer Mother   . Heart disease  Mother   . Hypertension Mother   . Heart disease Father   . Hypertension Father   . Rheum arthritis Maternal Grandfather   . Cancer Maternal Grandmother   . Hypertension Paternal Grandmother   . Heart disease Paternal Grandmother   . Stroke Paternal Grandmother   . Heart disease Paternal Grandfather   . Hypertension Son   . Arrhythmia Son     Allergies  Allergen Reactions  . Demerol     Irritability, and shortness of breath    Current Outpatient Prescriptions on File Prior to Visit  Medication Sig Dispense Refill  . Cholecalciferol (VITAMIN D-1000 MAX ST) 1000 UNITS tablet Take 1 tablet by mouth daily.      . cyclobenzaprine (FLEXERIL) 5 MG tablet TAKE 1 TABLET BY MOUTH 3 TIMES A DAY AS NEEDED FOR MUSCLE SPASM  30 tablet  0  . esomeprazole (NEXIUM) 40 MG capsule Take 1 capsule (40 mg total) by mouth daily before breakfast. For GERD      . FERROUS SULFATE  PO 1 PO QD W/VIT C-TO INCREASE ABSORPTION      . hydrochlorothiazide (HYDRODIURIL) 25 MG tablet Take 1 tablet (25 mg total) by mouth daily.  30 tablet  2  . levothyroxine (SYNTHROID, LEVOTHROID) 150 MCG tablet TAKE 1 TABLET (150 MCG TOTAL) BY MOUTH DAILY.  90 tablet  0  . loratadine (CLARITIN) 10 MG tablet Take 10 mg by mouth daily as needed for allergies.      Marland Kitchen losartan (COZAAR) 100 MG tablet Take 1 tablet (100 mg total) by mouth daily.  30 tablet  11  . metoprolol succinate (TOPROL-XL) 100 MG 24 hr tablet Take 2 tablets (200 mg total) by mouth daily. Take with or immediately following a meal.  60 tablet  2  . Multiple Vitamins-Minerals (MULTIVITAMIN PO) Take 1 tablet by mouth daily.      . naproxen sodium (ANAPROX) 220 MG tablet Take 220 mg by mouth 2 (two) times daily with a meal.      . venlafaxine XR (EFFEXOR-XR) 150 MG 24 hr capsule Take 1 capsule (150 mg total) by mouth daily with breakfast. For anxiety and depression.  30 capsule  0   No current facility-administered medications on file prior to visit.    BP 190/100  Pulse 83  Temp(Src) 97.9 F (36.6 C) (Oral)  Resp 18  Ht  (1.626 m)  SpO2 99%       Objective:   Physical Exam  Constitutional: She appears well-developed and well-nourished. No distress.  Cardiovascular: Normal rate and regular rhythm.   No murmur heard. Pulmonary/Chest: Effort normal and breath sounds normal. No respiratory distress. She has no wheezes. She has no rales. She exhibits no tenderness.  Psychiatric: Her speech is normal. Judgment normal. Her mood appears not anxious. She is slowed. Cognition and memory are normal. She exhibits a depressed mood.          Assessment & Plan:

## 2014-07-17 NOTE — Progress Notes (Signed)
Pre visit review using our clinic review tool, if applicable. No additional management support is needed unless otherwise documented below in the visit note. 

## 2014-07-17 NOTE — Patient Instructions (Signed)
Start Wellbutrin in addition to your Effexor. Contact Dr. Carie Caddy office to schedule appointment. [address], [place], [place] [address] [phone] Contact [facility] to arrange visit with Delorise Jackson for counseling. Schedule follow up with Dr. Vickey Huger for your sleep apnea. Start amlodipine 5 mg once daily for blood pressure and restart metoprolol today.  Follow up with me in 2 weeks for blood pressure recheck.

## 2014-07-18 ENCOUNTER — Telehealth: Payer: Self-pay | Admitting: Family

## 2014-07-18 NOTE — Telephone Encounter (Signed)
Relevant patient education assigned to patient using Emmi. ° °

## 2014-07-19 NOTE — Assessment & Plan Note (Signed)
Deteriorated.  Pt was given clonidine 0.1mg  po in the office and BP came down to 160/105. Advised pt to pick up metoprolol and start today. Will also add amlodipine to her regimen.   40 minutes spent with pt today.  >50% of this time was spent counseling pt on depression and hypertension.

## 2014-07-19 NOTE — Assessment & Plan Note (Signed)
Deteriorated.  Would like referral to another psychiatrist.  Will refer to Dr. Evelene Croon (she has seen her in the past). In the meantime, will add back in wellbutrin (she has taken and tolerated in the past). She is instructed to go to the ED if she develops thoughts of hurting self or others. She verbalizes understanding.

## 2014-07-31 ENCOUNTER — Other Ambulatory Visit: Payer: Self-pay | Admitting: Family

## 2014-07-31 NOTE — Telephone Encounter (Signed)
Pt last seen 07/17/14 and has f/u 08/13/14.  Please advise refill:   Medication name:  Name from pharmacy:  cyclobenzaprine (FLEXERIL) 5 MG tablet  CYCLOBENZAPRINE 5 MG TABLET Sig: TAKE 1 TABLET BY MOUTH 3 TIMES A DAY AS NEEDED FOR MUSCLE SPASM Dispense: 30 tablet Refills: 0 Start: 07/31/2014 Class: Normal Requested on: 06/29/2014 Originally ordered on: 10/23/2013 Last refill: 06/29/2014

## 2014-08-13 ENCOUNTER — Ambulatory Visit (INDEPENDENT_AMBULATORY_CARE_PROVIDER_SITE_OTHER): Payer: BC Managed Care – PPO | Admitting: Licensed Clinical Social Worker

## 2014-08-13 ENCOUNTER — Encounter: Payer: Self-pay | Admitting: Family

## 2014-08-13 ENCOUNTER — Ambulatory Visit (INDEPENDENT_AMBULATORY_CARE_PROVIDER_SITE_OTHER): Payer: BC Managed Care – PPO | Admitting: Family

## 2014-08-13 VITALS — BP 147/78 | HR 74 | Temp 98.5°F | Resp 16 | Ht 64.0 in | Wt 287.2 lb

## 2014-08-13 DIAGNOSIS — F331 Major depressive disorder, recurrent, moderate: Secondary | ICD-10-CM

## 2014-08-13 DIAGNOSIS — I10 Essential (primary) hypertension: Secondary | ICD-10-CM

## 2014-08-13 DIAGNOSIS — F32A Depression, unspecified: Secondary | ICD-10-CM

## 2014-08-13 DIAGNOSIS — F329 Major depressive disorder, single episode, unspecified: Secondary | ICD-10-CM

## 2014-08-13 DIAGNOSIS — F3289 Other specified depressive episodes: Secondary | ICD-10-CM

## 2014-08-13 MED ORDER — BUPROPION HCL ER (SR) 150 MG PO TB12: ORAL_TABLET | ORAL | Status: DC

## 2014-08-13 MED ORDER — AMLODIPINE BESYLATE 10 MG PO TABS: 10.0000 mg | ORAL_TABLET | Freq: Every day | ORAL | Status: DC

## 2014-08-13 MED ORDER — ZOLPIDEM TARTRATE 5 MG PO TABS: 5.0000 mg | ORAL_TABLET | Freq: Every evening | ORAL | Status: DC | PRN

## 2014-08-13 NOTE — Patient Instructions (Signed)
Please increase amlodipine from  to .  Continue wellbutrin and your work with Berniece Andreas. Follow up in 1 month.

## 2014-08-13 NOTE — Progress Notes (Signed)
Pre visit review using our clinic review tool, if applicable. No additional management support is needed unless otherwise documented below in the visit note/SLS  

## 2014-08-13 NOTE — Progress Notes (Signed)
Subjective:    Patient ID: Melinda Krause, female    DOB: Oct 06, 1961, 53 y.o.   MRN: [id number]  HPI  Melinda Krause is a 53 yr old female who presents today for follow up.   1)Depression -  Referred pt to Dr. Evelene Croon- she does have an appointment scheduled but she is unable to get in for a few months.  She resumed wellbutrin. Reports that it is helping some.  Does notice mild side effect of "ringing in ears". Working with Dede Query MSW. Feels like she is interacting more with her husband at home.    2) HTN-  Last visit amlodipine was added to her regimen.   BP Readings from Last 3 Encounters:  08/13/14 147/78  07/17/14 164/104  05/08/14 171/90      Review of Systems See HPI  Past Medical History  Diagnosis Date  . History of chicken pox     childhood  . Depression     Counseling-Dr [facility]  . Headache(784.0)     daily  . Peptic ulcer   . Seasonal allergies   . Migraine     1-2 per month  . Hypothyroidism   . Fibromyalgia     Rheumatologist- Dr. Ardis Hughs  . Hypertension   . Hyperlipidemia   . Pain in joint, shoulder region 04/02/2013    left  . Neck injury     C-5-6  . Non compliance with medical treatment 04/02/2014  . Morbid obesity with BMI of 45.0-49.9, adult     History   Social History  . Marital Status: Married    Spouse Name: [name]    Number of Children: 3  . 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 Smoking status: Never Smoker   . Smokeless tobacco: Never Used  . Alcohol Use: No  . Drug Use: No  . Sexual Activity: Yes    Birth Control/ Protection: None   Other Topics Concern  . Not on file   Social History Narrative   [name]. Three children.   Patient has a Bachelor's degree.  Is currently employed.   Patient is right-handed.   Patient drinks 2-3 Diet sodas daily.   Does not Drink EtOH or smoke    Past Surgical History  Procedure Laterality Date  . Vaginal hysterectomy  2003  . Cesarean section  1989  . Rectocele repair  2009  . Vagina reconstruction surgery  2011    vaginal mesh repair  . Colonoscopy    . Upper gastrointestinal endoscopy    . Removal of vaginal mesh  11/2012  . Nm myoview ltd  04/18/2014    LexiScan: EF 60%; no evidence of ischemia or infarction. Normal stress test to  . Transthoracic echocardiogram  04/18/2014    EF 55-60%. Difficult to assess for regional wall motion due to poor quality.  Normal diastolic pressures/function. No significant valvular lesions.    Family History  Problem Relation Age of Onset  . Emphysema Mother   . Allergies Mother   . Asthma Mother   . Cancer Mother   . Heart disease Mother   . Hypertension Mother   . Heart disease Father   . Hypertension Father   . Rheum arthritis Maternal Grandfather   . Cancer Maternal Grandmother   . Hypertension Paternal Grandmother   .  Heart disease Paternal Grandmother   . Stroke Paternal Grandmother   . Heart disease Paternal Grandfather   . Hypertension Son   . Arrhythmia Son     Allergies  Allergen Reactions  . Demerol     Irritability, and shortness of breath    Current Outpatient Prescriptions on File Prior to Visit  Medication Sig Dispense Refill  . Cholecalciferol (VITAMIN D-1000 MAX ST) 1000 UNITS tablet Take 1 tablet by mouth daily.      . cyclobenzaprine (FLEXERIL) 5 MG tablet TAKE 1 TABLET BY MOUTH 3 TIMES A DAY AS NEEDED FOR MUSCLE SPASM  30 tablet  0  . FERROUS SULFATE PO 1 PO QD W/VIT C-TO INCREASE ABSORPTION      . hydrochlorothiazide (HYDRODIURIL) 25 MG tablet Take 1 tablet (25 mg total) by mouth daily.  30 tablet  2  . levothyroxine (SYNTHROID, LEVOTHROID) 150 MCG tablet TAKE 1 TABLET (150 MCG TOTAL) BY MOUTH DAILY.  90 tablet  0  . loratadine (CLARITIN) 10 MG tablet Take 10 mg by mouth daily as needed for allergies.      Marland Kitchen losartan  (COZAAR) 100 MG tablet Take 1 tablet (100 mg total) by mouth daily.  30 tablet  11  . metoprolol succinate (TOPROL-XL) 100 MG 24 hr tablet Take 2 tablets (200 mg total) by mouth daily. Take with or immediately following a meal.  60 tablet  2  . Multiple Vitamins-Minerals (MULTIVITAMIN PO) Take 1 tablet by mouth daily.      . naproxen sodium (ANAPROX) 220 MG tablet Take 220 mg by mouth 2 (two) times daily with a meal.      . omeprazole (PRILOSEC) 40 MG capsule Take 1 capsule (40 mg total) by mouth daily.  30 capsule  5  . venlafaxine XR (EFFEXOR-XR) 150 MG 24 hr capsule Take 1 capsule (150 mg total) by mouth daily with breakfast. For anxiety and depression.  30 capsule  0   No current facility-administered medications on file prior to visit.    BP 147/78  Pulse 74  Temp(Src) 98.5 F (36.9 C) (Oral)  Resp 16  Ht  (1.626 m)  Wt 287 lb 4 oz (130.296 kg)  BMI 49.28 kg/m2  SpO2 97%       Objective:   Physical Exam  Constitutional: She is oriented to person, place, and time. She appears well-developed and well-nourished. No distress.  Neurological: She is alert and oriented to person, place, and time.  Psychiatric:  Still appears depressed but more interactive today          Assessment & Plan:

## 2014-08-18 NOTE — Assessment & Plan Note (Signed)
Some improvement in her symptoms with the addition of wellbutrin.  She will continue wellbutrin, work with Berniece Andreas, counselor and keep her upcoming appointment with Dr. Evelene Croon.

## 2014-08-18 NOTE — Assessment & Plan Note (Signed)
Improved but still above goal.  Increase amlodipine from  to .

## 2014-08-20 ENCOUNTER — Telehealth: Payer: Self-pay | Admitting: Neurology

## 2014-08-21 ENCOUNTER — Ambulatory Visit (INDEPENDENT_AMBULATORY_CARE_PROVIDER_SITE_OTHER): Payer: BC Managed Care – PPO | Admitting: Licensed Clinical Social Worker

## 2014-08-21 DIAGNOSIS — F411 Generalized anxiety disorder: Secondary | ICD-10-CM

## 2014-08-24 NOTE — Telephone Encounter (Signed)
Received notification from [facility] that they have been unable to contact the patient for CPAP setup.

## 2014-08-27 ENCOUNTER — Ambulatory Visit (INDEPENDENT_AMBULATORY_CARE_PROVIDER_SITE_OTHER): Payer: BC Managed Care – PPO | Admitting: Licensed Clinical Social Worker

## 2014-08-27 DIAGNOSIS — F332 Major depressive disorder, recurrent severe without psychotic features: Secondary | ICD-10-CM

## 2014-08-31 ENCOUNTER — Other Ambulatory Visit: Payer: Self-pay | Admitting: Family

## 2014-08-31 NOTE — Telephone Encounter (Signed)
Please advise refill:   Medication name:  Name from pharmacy:  cyclobenzaprine (FLEXERIL) 5 MG tablet  CYCLOBENZAPRINE 5 MG TABLET    Sig: TAKE 1 TABLET BY MOUTH 3 TIMES A DAY AS NEEDED FOR MUSCLE SPASM    Dispense: 30 tablet Refills: 0 Start: 08/31/2014  Class: Normal    Requested on: 07/31/2014    Originally ordered on: 10/23/2013 Last refill: 07/31/2014

## 2014-09-06 ENCOUNTER — Telehealth: Payer: Self-pay | Admitting: Family

## 2014-09-06 NOTE — Telephone Encounter (Signed)
Caller name: Terilyn  Call back number:[phone] [facility]   Reason for call:  Pt would like a refill on Rx venlafaxine XR (EFFEXOR-XR) 150 MG 24 hr capsule

## 2014-09-07 NOTE — Telephone Encounter (Signed)
Last visit noted that pt is taking wellbutrin. Venlafaxine still on current med list but last refill 2014.  Is pt supposed to be on both medications?

## 2014-09-07 NOTE — Telephone Encounter (Signed)
Yes

## 2014-09-07 NOTE — Telephone Encounter (Signed)
Patient calling back regarding this.  °

## 2014-09-10 ENCOUNTER — Ambulatory Visit (INDEPENDENT_AMBULATORY_CARE_PROVIDER_SITE_OTHER): Payer: BC Managed Care – PPO | Admitting: Licensed Clinical Social Worker

## 2014-09-10 DIAGNOSIS — F332 Major depressive disorder, recurrent severe without psychotic features: Secondary | ICD-10-CM

## 2014-09-10 MED ORDER — VENLAFAXINE HCL ER 150 MG PO CP24: 150.0000 mg | ORAL_CAPSULE | Freq: Every day | ORAL | Status: DC

## 2014-09-10 NOTE — Telephone Encounter (Signed)
Rx sent. Left message on pt's voicemail re: completion and to call if any questions.

## 2014-09-12 ENCOUNTER — Encounter: Payer: Self-pay | Admitting: Family

## 2014-09-12 ENCOUNTER — Ambulatory Visit (INDEPENDENT_AMBULATORY_CARE_PROVIDER_SITE_OTHER): Payer: BC Managed Care – PPO | Admitting: Family

## 2014-09-12 VITALS — BP 164/83 | HR 88 | Temp 98.4°F | Resp 18 | Ht 65.0 in | Wt 287.0 lb

## 2014-09-12 DIAGNOSIS — F329 Major depressive disorder, single episode, unspecified: Secondary | ICD-10-CM

## 2014-09-12 DIAGNOSIS — I10 Essential (primary) hypertension: Secondary | ICD-10-CM

## 2014-09-12 DIAGNOSIS — G4700 Insomnia, unspecified: Secondary | ICD-10-CM

## 2014-09-12 DIAGNOSIS — F32A Depression, unspecified: Secondary | ICD-10-CM

## 2014-09-12 DIAGNOSIS — R5383 Other fatigue: Secondary | ICD-10-CM

## 2014-09-12 MED ORDER — ZOLPIDEM TARTRATE 10 MG PO TABS: 10.0000 mg | ORAL_TABLET | Freq: Every evening | ORAL | Status: DC | PRN

## 2014-09-12 MED ORDER — HYDROCHLOROTHIAZIDE 25 MG PO TABS: 25.0000 mg | ORAL_TABLET | Freq: Every day | ORAL | Status: DC

## 2014-09-12 MED ORDER — CLONIDINE HCL 0.1 MG PO TABS: 0.1000 mg | ORAL_TABLET | Freq: Two times a day (BID) | ORAL | Status: DC

## 2014-09-12 MED ORDER — VENLAFAXINE HCL ER 75 MG PO CP24
75.0000 mg | ORAL_CAPSULE | Freq: Every day | ORAL | Status: DC
Start: 2014-09-12 — End: 2014-10-16

## 2014-09-12 NOTE — Progress Notes (Signed)
Subjective:    Patient ID: Melinda Krause, female    DOB: 11/18/1961, 53 y.o.   MRN: [id number]  HPI  Melinda Krause is a 53 yr old female who presents today for follow up of her depression and HTN.  1) HTN- Pt reports + med compliance. Current meds include:  Amlodipine, losartan, metoprolol.   2) Depression- last visit wellbutrin dose was increased. She was continued on effexor xr.  She notes no improvement in her depression symptoms. She denies SI/HI. But notes that she only leaves her apartment to go to an appointment or meet with her therapist. She has a new pt visit scheduled with Dr. Evelene CroonKaur but she can not get in to see her until march.  Reports in the past when she tried zoloft, it worsened her GI problems.  Reports insomnia despite ambien 5mg  and is requesting that this be increased to 10mg .   Review of Systems See HPI  Past Medical History  Diagnosis Date  . History of chicken pox     childhood  . Depression     [facility]  . Headache(784.0)     daily  . Peptic ulcer   . Seasonal allergies   . Migraine     1-2 per month  . Hypothyroidism   . Fibromyalgia     Rheumatologist- Dr. Ardis HughsShali Devashwar  . Hypertension   . Hyperlipidemia   . Pain in joint, shoulder region 04/02/2013    left  . Neck injury     C-5-6  . Non compliance with medical treatment 04/02/2014  . Morbid obesity with BMI of 45.0-49.9, adult     History   Social History  . Marital Status: Married    Spouse Name: Harolyn RutherfordLucian    Number of Children: 3  . 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 Smoking status: Never Smoker   . Smokeless tobacco: Never Used  . Alcohol Use: No  . Drug Use: No  . Sexual Activity: Yes    Birth Control/ Protection: None   Other Topics Concern  . Not on file   Social History Narrative   Patient is married [name]([name]) and lives at home with her husband.  Three children.   Patient has a Bachelor's degree.  Is currently employed.   Patient is right-handed.   Patient drinks 2-3 Diet sodas daily.   Does not Drink EtOH or smoke    Past Surgical History  Procedure Laterality Date  . Vaginal hysterectomy  2003  . Cesarean section  1989  . Rectocele repair  2009  . Vagina reconstruction surgery  2011    vaginal mesh repair  . Colonoscopy    . Upper gastrointestinal endoscopy    . Removal of vaginal mesh  11/2012  . Nm myoview ltd  04/18/2014    LexiScan: EF 60%; no evidence of ischemia or infarction. Normal stress test to  . Transthoracic echocardiogram  04/18/2014    EF 55-60%. Difficult to assess for regional wall motion due to poor quality.  Normal diastolic pressures/function. No significant valvular lesions.    Family History  Problem Relation Age of Onset  . Emphysema Mother   . Allergies Mother   . Asthma Mother   . Cancer Mother   . Heart disease Mother   . Hypertension Mother   . Heart disease Father   . Hypertension Father   .  Rheum arthritis Maternal Grandfather   . Cancer Maternal Grandmother   . Hypertension Paternal Grandmother   . Heart disease Paternal Grandmother   . Stroke Paternal Grandmother   . Heart disease Paternal Grandfather   . Hypertension Son   . Arrhythmia Son     Allergies  Allergen Reactions  . Demerol     Irritability, and shortness of breath    Current Outpatient Prescriptions on File Prior to Visit  Medication Sig Dispense Refill  . amLODipine (NORVASC) 10 MG tablet Take 1 tablet (10 mg total) by mouth daily.  30 tablet  0  . buPROPion (WELLBUTRIN SR) 150 MG 12 hr tablet Take one tab by mouth twice daily  60 tablet  2  . Cholecalciferol (VITAMIN D-1000 MAX ST) 1000 UNITS tablet Take 1 tablet by mouth daily.      . cyclobenzaprine (FLEXERIL) 5 MG tablet TAKE 1 TABLET BY MOUTH 3 TIMES A DAY AS NEEDED FOR MUSCLE SPASM  30 tablet  0  . FERROUS SULFATE PO 1 PO QD W/VIT C-TO INCREASE  ABSORPTION      . hydrochlorothiazide (HYDRODIURIL) 25 MG tablet Take 1 tablet (25 mg total) by mouth daily.  30 tablet  2  . levothyroxine (SYNTHROID, LEVOTHROID) 150 MCG tablet TAKE 1 TABLET (150 MCG TOTAL) BY MOUTH DAILY.  90 tablet  0  . loratadine (CLARITIN) 10 MG tablet Take 10 mg by mouth daily as needed for allergies.      Marland Kitchen. losartan (COZAAR) 100 MG tablet Take 1 tablet (100 mg total) by mouth daily.  30 tablet  11  . metoprolol succinate (TOPROL-XL) 100 MG 24 hr tablet Take 2 tablets (200 mg total) by mouth daily. Take with or immediately following a meal.  60 tablet  2  . Multiple Vitamins-Minerals (MULTIVITAMIN PO) Take 1 tablet by mouth daily.      . naproxen sodium (ANAPROX) 220 MG tablet Take 220 mg by mouth 2 (two) times daily with a meal.      . omeprazole (PRILOSEC) 40 MG capsule Take 1 capsule (40 mg total) by mouth daily.  30 capsule  5  . venlafaxine XR (EFFEXOR-XR) 150 MG 24 hr capsule Take 1 capsule (150 mg total) by mouth daily with breakfast. For anxiety and depression.  30 capsule  2  . zolpidem (AMBIEN) 5 MG tablet Take 1 tablet (5 mg total) by mouth at bedtime as needed for sleep.  30 tablet  0   No current facility-administered medications on file prior to visit.    BP 164/83  Pulse 88  Temp(Src) 98.4 F (36.9 C) (Oral)  Resp 18  Ht 5\' 5"  (1.651 m)  Wt 287 lb (130.182 kg)  BMI 47.76 kg/m2  SpO2 96%       Objective:   Physical Exam  Constitutional: She appears well-developed and well-nourished. No distress.  Cardiovascular: Normal rate and regular rhythm.   Pulmonary/Chest: Effort normal and breath sounds normal.  Psychiatric: Judgment and thought content normal.  Flat affect          Assessment & Plan:

## 2014-09-12 NOTE — Assessment & Plan Note (Signed)
Increase ambien from 5mg  to 10mg .

## 2014-09-12 NOTE — Assessment & Plan Note (Signed)
Will add clonidine to her regimen. Close monitoring due to effexor increase.

## 2014-09-12 NOTE — Patient Instructions (Addendum)
Please complete lab work prior to leaving. Add effexor xr 75mg  to the 150mg  tab for a total of 225mg  once daily. Increase ambien from 5mg  to 10mg . Add clonidine twice daily for blood pressure control. Follow up in 3-4 weeks.  Here are some additional numbers you can try to get an earlier psychiatry appointment- [facility] [phone] [facility]- [phone]

## 2014-09-12 NOTE — Assessment & Plan Note (Signed)
Continue wellbutrin, increase effexor from 150 to 225mg  once daily. Gave pt some additional local psych numbers to try to see if she can get in to see psych sooner. Continue work with therapist. Follow up in 3 weeks.

## 2014-09-13 ENCOUNTER — Encounter: Payer: Self-pay | Admitting: Family

## 2014-09-13 LAB — CBC WITH DIFFERENTIAL/PLATELET
Basophils Absolute: 0 K/uL (ref 0.0–0.1)
Basophils Relative: 0.5 % (ref 0.0–3.0)
Eosinophils Absolute: 0.1 K/uL (ref 0.0–0.7)
Eosinophils Relative: 2.1 % (ref 0.0–5.0)
HCT: 45 % (ref 36.0–46.0)
Hemoglobin: 15.2 g/dL — ABNORMAL HIGH (ref 12.0–15.0)
Lymphocytes Relative: 33.3 % (ref 12.0–46.0)
Lymphs Abs: 2.1 K/uL (ref 0.7–4.0)
MCHC: 33.7 g/dL (ref 30.0–36.0)
MCV: 90.9 fl (ref 78.0–100.0)
Monocytes Absolute: 0.5 K/uL (ref 0.1–1.0)
Monocytes Relative: 7.7 % (ref 3.0–12.0)
Neutro Abs: 3.6 K/uL (ref 1.4–7.7)
Neutrophils Relative %: 56.4 % (ref 43.0–77.0)
Platelets: 361 K/uL (ref 150.0–400.0)
RBC: 4.95 Mil/uL (ref 3.87–5.11)
RDW: 15.4 % (ref 11.5–15.5)
WBC: 6.3 K/uL (ref 4.0–10.5)

## 2014-09-13 LAB — TSH: TSH: 2.86 u[IU]/mL (ref 0.35–4.50)

## 2014-09-18 ENCOUNTER — Ambulatory Visit: Payer: BC Managed Care – PPO | Admitting: Licensed Clinical Social Worker

## 2014-09-26 ENCOUNTER — Ambulatory Visit (INDEPENDENT_AMBULATORY_CARE_PROVIDER_SITE_OTHER): Payer: BC Managed Care – PPO | Admitting: Licensed Clinical Social Worker

## 2014-09-26 DIAGNOSIS — F332 Major depressive disorder, recurrent severe without psychotic features: Secondary | ICD-10-CM

## 2014-09-27 ENCOUNTER — Encounter: Payer: Self-pay | Admitting: Family

## 2014-10-01 ENCOUNTER — Other Ambulatory Visit: Payer: Self-pay | Admitting: Family

## 2014-10-02 NOTE — Telephone Encounter (Signed)
Pt has f/u 10/09/14.  Please advise.  Medication name:  Name from pharmacy:  cyclobenzaprine (FLEXERIL) 5 MG tablet CYCLOBENZAPRINE 5 MG TABLET     Sig: TAKE 1 TABLET BY MOUTH 3 TIMES A DAY AS NEEDED FOR MUSCLE SPASM    Dispense: 30 tablet   Refills: 0   Start: 10/01/2014   Class: Normal    Requested on: 09/02/2014    Originally ordered on: 10/23/2013 09/02/2014

## 2014-10-08 ENCOUNTER — Ambulatory Visit: Payer: BC Managed Care – PPO | Admitting: Licensed Clinical Social Worker

## 2014-10-09 ENCOUNTER — Ambulatory Visit: Payer: BC Managed Care – PPO | Admitting: Family

## 2014-10-11 ENCOUNTER — Other Ambulatory Visit: Payer: Self-pay | Admitting: Family

## 2014-10-12 NOTE — Telephone Encounter (Signed)
Zolpidem rx called to pharmacy voicemail #[phone] x no refills.

## 2014-10-16 ENCOUNTER — Other Ambulatory Visit: Payer: Self-pay | Admitting: Family

## 2014-10-16 NOTE — Telephone Encounter (Signed)
2 week supply sent for pt's effexor. Pt cancelled f/u on 10/09/14 and will need to be seen before further refills are given. Please call pt to schedule appt before 2 week supply runs out.

## 2014-10-17 ENCOUNTER — Other Ambulatory Visit: Payer: Self-pay | Admitting: Family Medicine

## 2014-10-17 ENCOUNTER — Other Ambulatory Visit: Payer: Self-pay | Admitting: Family

## 2014-10-25 ENCOUNTER — Other Ambulatory Visit: Payer: Self-pay | Admitting: Family

## 2014-10-26 NOTE — Telephone Encounter (Signed)
#  30 Cyclobenzaprine sent to pharmacy. Please call pt to arrange appt soon as further refills cannot be given until pt is seen in the office.

## 2014-10-26 NOTE — Telephone Encounter (Signed)
Ok to send refill, but pt needs OV prior to additional refills please.

## 2014-10-26 NOTE — Telephone Encounter (Signed)
Pt last seen by us on 09/12/14 and cancelled f/u on 10/09/14. Last rx provided 10/02/14, #30.  Please advise refill:   Medication name:  Name from pharmacy:  cyclobenzaprine (FLEXERIL) 5 MG tablet CYCLOBENZAPRINE 5 MG TABLET    Sig: TAKE 1 TABLET BY MOUTH 3 TIMES DAILY AS NEEDED FOR MUSCLE SPASM.    Dispense: 30 tablet   Refills: 0   Start: 10/25/2014   Class: Normal    Requested on: 10/02/2014    Originally ordered on: 10/23/2013 10/02/2014

## 2014-10-27 NOTE — Telephone Encounter (Signed)
Spoke to patient husband and informed him of medication refill and for patient to schedule appointment.

## 2014-11-05 ENCOUNTER — Other Ambulatory Visit: Payer: Self-pay | Admitting: Family

## 2014-11-12 ENCOUNTER — Ambulatory Visit (INDEPENDENT_AMBULATORY_CARE_PROVIDER_SITE_OTHER): Payer: BC Managed Care – PPO | Admitting: Family

## 2014-11-12 ENCOUNTER — Encounter: Payer: Self-pay | Admitting: Family

## 2014-11-12 VITALS — BP 137/73 | HR 72 | Temp 97.9°F | Resp 16 | Ht 65.0 in | Wt 286.2 lb

## 2014-11-12 DIAGNOSIS — I10 Essential (primary) hypertension: Secondary | ICD-10-CM

## 2014-11-12 DIAGNOSIS — E038 Other specified hypothyroidism: Secondary | ICD-10-CM

## 2014-11-12 DIAGNOSIS — F329 Major depressive disorder, single episode, unspecified: Secondary | ICD-10-CM

## 2014-11-12 DIAGNOSIS — G4700 Insomnia, unspecified: Secondary | ICD-10-CM

## 2014-11-12 DIAGNOSIS — F458 Other somatoform disorders: Secondary | ICD-10-CM

## 2014-11-12 DIAGNOSIS — F32A Depression, unspecified: Secondary | ICD-10-CM

## 2014-11-12 MED ORDER — ZOLPIDEM TARTRATE 10 MG PO TABS: 10.0000 mg | ORAL_TABLET | Freq: Every evening | ORAL | Status: DC | PRN

## 2014-11-12 MED ORDER — CYCLOBENZAPRINE HCL 5 MG PO TABS: ORAL_TABLET | ORAL | Status: DC

## 2014-11-12 NOTE — Assessment & Plan Note (Signed)
Improved on 10mg  of ambien, continue same.

## 2014-11-12 NOTE — Progress Notes (Signed)
Pre visit review using our clinic review tool, if applicable. No additional management support is needed unless otherwise documented below in the visit note. 

## 2014-11-12 NOTE — Assessment & Plan Note (Signed)
Improved by prn flexeril, refill provided.

## 2014-11-12 NOTE — Patient Instructions (Signed)
Follow up in 3 months.  Happy holidays!

## 2014-11-12 NOTE — Assessment & Plan Note (Signed)
Pt advised to keep upcoming apt with psych (Dr. Evelene CroonKaur). [facility] if she can get [facility] sooner. She continues with Berniece AndreasJulie Whitt and seems some improved to me today. Continue current meds.

## 2014-11-12 NOTE — Progress Notes (Signed)
Subjective:    Patient ID: Melinda Krause, female    DOB: 1961-01-21, 53 y.o.   MRN: [id number]  HPI  Ms. Melinda Krause is a 53 yr old female who presents today for follow up of multiple medical issue:  1) HTN-  Patient is currently maintained on the following medications for blood pressure: losartan, metoprolol, clonidine, amlodipine.  Patient reports good compliance with blood pressure medications. Patient denies chest pain, or swelling. Notes that she sometimes gets fatigued with walking.   Last 3 blood pressure readings in our office are as follows:  BP Readings from Last 3 Encounters:  11/12/14 137/73  09/12/14 164/83  08/13/14 147/78   2) Depression- Uses ambien prn sleep. Last visit effexor and ambien doses were increased.  She is continued on wellutrin.  Now sleeping 4-5 hours at a time which is am improvement.  Reports that she is less tearful, interacting more on the phone with her children. She is looking into scheduling an [facility] [place].    3) jaw clenching-Notes improvement with flexeril, but reports that she is using more that we are prescribing her and would like greater quantitity. Reports that she cannot afford to see a dentist.    4) Hypothyroid- Maintained on synthroid.  Lab Results  Component Value Date   TSH 2.86 09/12/2014    Review of Systems See HPI  Past Medical History  Diagnosis Date  . History of chicken pox     childhood  . Depression     Counseling-Dr [facility]  . Headache(784.0)     daily  . Peptic ulcer   . Seasonal allergies   . Migraine     1-2 per month  . Hypothyroidism   . Fibromyalgia     Rheumatologist- Dr. Ardis Hughs  . Hypertension   . Hyperlipidemia   . Pain in joint, shoulder region 04/02/2013    left  . Neck injury     C-5-6  . Non compliance with medical treatment 04/02/2014  . Morbid obesity with BMI of 45.0-49.9, adult     History   Social History  .  Marital Status: Married    Spouse Name: Melinda Krause    Number of Children: 3  . 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 Smoking status: Never Smoker   . Smokeless tobacco: Never Used  . Alcohol Use: No  . Drug Use: No  . Sexual Activity: Yes    Birth Control/ Protection: None   Other Topics Concern  . Not on file   Social History Narrative   Patient is married Melinda Krause) and lives at home with her husband. Three children.   Patient has a Bachelor's degree.  Is currently employed.   Patient is right-handed.   Patient drinks 2-3 Diet sodas daily.   Does not Drink EtOH or smoke    Past Surgical History  Procedure Laterality Date  . Vaginal hysterectomy  2003  . Cesarean section  1989  . Rectocele repair  2009  . Vagina reconstruction surgery  2011    vaginal mesh repair  . Colonoscopy    . Upper gastrointestinal endoscopy    . Removal of vaginal mesh  11/2012  . Nm myoview ltd  04/18/2014    LexiScan: EF 60%; no evidence of ischemia or infarction. Normal stress test to  . Transthoracic echocardiogram  04/18/2014    EF 55-60%. Difficult to assess for regional wall motion due to poor quality.  Normal diastolic pressures/function. No significant valvular lesions.    Family History  Problem Relation Age of Onset  . Emphysema Mother   . Allergies Mother   . Asthma Mother   . Cancer Mother   . Heart disease Mother   . Hypertension Mother   . Heart disease Father   . Hypertension Father   . Rheum arthritis Maternal Grandfather   . Cancer Maternal Grandmother   . Hypertension Paternal Grandmother   . Heart disease Paternal Grandmother   . Stroke Paternal Grandmother   . Heart disease Paternal Grandfather   . Hypertension Son   . Arrhythmia Son     Allergies  Allergen Reactions  . Demerol     Irritability, and shortness of breath    Current Outpatient Prescriptions on File Prior to  Visit  Medication Sig Dispense Refill  . amLODipine (NORVASC) 10 MG tablet Take 1 tablet (10 mg total) by mouth daily. 30 tablet 0  . buPROPion (WELLBUTRIN SR) 150 MG 12 hr tablet Take one tab by mouth twice daily 60 tablet 2  . Cholecalciferol (VITAMIN D-1000 MAX ST) 1000 UNITS tablet Take 1 tablet by mouth daily.    . cloNIDine (CATAPRES) 0.1 MG tablet Take 1 tablet (0.1 mg total) by mouth 2 (two) times daily. 60 tablet 2  . cyclobenzaprine (FLEXERIL) 5 MG tablet TAKE 1 TABLET BY MOUTH 3 TIMES DAILY AS NEEDED FOR MUSCLE SPASM. 30 tablet 0  . FERROUS SULFATE PO 1 PO QD W/VIT C-TO INCREASE ABSORPTION    . hydrochlorothiazide (HYDRODIURIL) 25 MG tablet Take 1 tablet (25 mg total) by mouth daily. 30 tablet 2  . levothyroxine (SYNTHROID, LEVOTHROID) 150 MCG tablet TAKE 1 TABLET (150 MCG TOTAL) BY MOUTH DAILY. 90 tablet 0  . loratadine (CLARITIN) 10 MG tablet Take 10 mg by mouth daily as needed for allergies.    Marland Kitchen. losartan (COZAAR) 100 MG tablet Take 1 tablet (100 mg total) by mouth daily. 30 tablet 11  . metoprolol succinate (TOPROL-XL) 100 MG 24 hr tablet TAKE 2 TABLETS (200 MG TOTAL) BY MOUTH DAILY. TAKE WITH OR IMMEDIATELY FOLLOWING A MEAL. 60 tablet 2  . Multiple Vitamins-Minerals (MULTIVITAMIN PO) Take 1 tablet by mouth daily.    . naproxen sodium (ANAPROX) 220 MG tablet Take 220 mg by mouth 2 (two) times daily with a meal.    . omeprazole (PRILOSEC) 40 MG capsule Take 1 capsule (40 mg total) by mouth daily. 30 capsule 5  . venlafaxine XR (EFFEXOR-XR) 150 MG 24 hr capsule Take 1 capsule (150 mg total) by mouth daily with breakfast. For anxiety and depression. 30 capsule 2  . venlafaxine XR (EFFEXOR-XR) 75 MG 24 hr capsule TAKE 1 CAPSULE (75 MG TOTAL) BY MOUTH DAILY WITH BREAKFAST. 14 capsule 0  . zolpidem (AMBIEN) 10 MG tablet TAKE 1 TABLET BY MOUTH AT BEDTIME AS NEEDED FOR SLEEP 30 tablet 0   No current facility-administered medications on file prior to visit.    BP 137/73 mmHg  Pulse 72   Temp(Src) 97.9 F (36.6 C) (Oral)  Resp 16  Ht 5\' 5"  (1.651 m)  Wt 286 lb 3.2 oz (129.819 kg)  BMI 47.63 kg/m2  SpO2 98%       Objective:   Physical Exam  Constitutional: She is oriented to person, place, and time. She appears well-developed and well-nourished. No distress.  Cardiovascular: Normal rate and regular rhythm.  No murmur heard. Pulmonary/Chest: Effort normal and breath sounds normal. No respiratory distress. She has no wheezes. She has no rales. She exhibits no tenderness.  Neurological: She is alert and oriented to person, place, and time.  Psychiatric: Her behavior is normal. Judgment and thought content normal.  Slightly flat affect.  Though seems improved since last visit.           Assessment & Plan:

## 2014-11-12 NOTE — Assessment & Plan Note (Signed)
Stable on current dose of synthroid, continue same.  

## 2014-11-12 NOTE — Assessment & Plan Note (Signed)
BP is stable on current meds.  Continue same.  

## 2014-11-13 ENCOUNTER — Telehealth: Payer: Self-pay | Admitting: Family

## 2014-11-13 MED ORDER — ZOLPIDEM TARTRATE 10 MG PO TABS: 10.0000 mg | ORAL_TABLET | Freq: Every evening | ORAL | Status: DC | PRN

## 2014-11-13 NOTE — Telephone Encounter (Signed)
Spoke with Ray at [facility] and he states they did not receive zolpidem rx. Rx given verbally to Ray, #30 x no refills. Notified pt.

## 2014-11-13 NOTE — Telephone Encounter (Signed)
Pt states pharmacy never received zolpidem (AMBIEN) 10 MG tablet

## 2014-11-20 ENCOUNTER — Other Ambulatory Visit: Payer: Self-pay | Admitting: Family

## 2014-12-02 ENCOUNTER — Other Ambulatory Visit: Payer: Self-pay | Admitting: Family

## 2014-12-03 NOTE — Telephone Encounter (Signed)
Bupropion refill sent to pharmacy.  Amlodipine request from pharmacy is for 5mg  and current med list states 10mg .  Left detailed message on pt's cell # to call and clarify which Rx she is taking.

## 2014-12-04 NOTE — Telephone Encounter (Signed)
Left detailed message on cell# to call with current dose of amlodipine.

## 2014-12-05 ENCOUNTER — Telehealth: Payer: Self-pay | Admitting: Family

## 2014-12-05 MED ORDER — AMLODIPINE BESYLATE 10 MG PO TABS: 10.0000 mg | ORAL_TABLET | Freq: Every day | ORAL | Status: DC

## 2014-12-05 NOTE — Telephone Encounter (Signed)
Pt states you had wanted her to inform you of the dosage for her amLODipine (NORVASC) 10 MG tablet, she states she is taking 10 mg and she called in the wrong rx by mistake

## 2014-12-05 NOTE — Telephone Encounter (Signed)
Refill sent for Amlodipine 10mg . Denial sent to pharmacy for previous 5mg  request.

## 2014-12-05 NOTE — Telephone Encounter (Signed)
Refill sent.   Melinda Krause at 12/05/2014 2:23 PM     Status: Signed       Expand All Collapse All   Pt states you had wanted her to inform you of the dosage for her amLODipine (NORVASC) 10 MG tablet, she states she is taking 10 mg and she called in the wrong rx by mistake

## 2014-12-12 ENCOUNTER — Other Ambulatory Visit: Payer: Self-pay | Admitting: Family

## 2014-12-14 ENCOUNTER — Other Ambulatory Visit: Payer: Self-pay | Admitting: Family

## 2014-12-17 NOTE — Telephone Encounter (Signed)
Rx last given 11/13/14.  Pt due for follow up in March.  Rx printed and forwarded to Provider for signature.  Medication name:  Name from pharmacy:  zolpidem (AMBIEN) 10 MG tablet ZOLPIDEM TARTRATE 10 MG TABLET     Sig: TAKE 1 TABLET BY MOUTH AT BEDTIME AS NEEDED FOR SLEEP    Dispense: 30 tablet   Refills: 0   Start: 12/14/2014   Class: Normal    Notes to pharmacy: Not to exceed 5 additional fills before 05/12/2015    Requested on: 11/13/2014    Originally ordered on: 09/12/2014 11/13/2014

## 2014-12-18 NOTE — Telephone Encounter (Signed)
Rx faxed to pharmacy on 12/17/14. 

## 2014-12-23 ENCOUNTER — Other Ambulatory Visit: Payer: Self-pay | Admitting: Family

## 2014-12-27 ENCOUNTER — Other Ambulatory Visit: Payer: Self-pay | Admitting: Family

## 2014-12-27 NOTE — Telephone Encounter (Signed)
Last filled:  11/12/14 Amt: 60, 0 refills Last OV:  11/12/14  Please advise.

## 2015-01-16 ENCOUNTER — Telehealth: Payer: Self-pay | Admitting: Family

## 2015-01-16 NOTE — Telephone Encounter (Signed)
Ok to send 30 tabs zero refills. 

## 2015-01-18 NOTE — Telephone Encounter (Signed)
Refill called in. 

## 2015-01-18 NOTE — Telephone Encounter (Signed)
Patient requesting this to be sent today. Thanks!

## 2015-01-29 ENCOUNTER — Other Ambulatory Visit: Payer: Self-pay | Admitting: Family

## 2015-01-29 NOTE — Telephone Encounter (Signed)
Pt has f/u 02/04/15.  Last rx given 12/28/14. Please advise   Medication name:  Name from pharmacy:  cyclobenzaprine (FLEXERIL) 5 MG tablet CYCLOBENZAPRINE 5 MG TABLET    Sig: TAKE 1 TABLET BY MOUTH 3 TIMES DAILY AS NEEDED FOR MUSCLE SPASM.    Dispense: 60 tablet   Refills: 0   Start: 01/29/2015   Class: Normal    Requested on: 12/28/2014    Originally ordered on: 10/23/2013 12/28/2014

## 2015-01-30 ENCOUNTER — Other Ambulatory Visit: Payer: Self-pay | Admitting: Family

## 2015-01-30 NOTE — Telephone Encounter (Signed)
Levothyroxine Last filled:  10/17/14 Amt: 90, 0   Metoprolol Last filled: 10/17/14 Amt: 60, 2  Last OV:  11/12/14 TSH: 09/12/14  Next appt: 02/04/15  Meds filled.

## 2015-02-04 ENCOUNTER — Ambulatory Visit: Payer: Self-pay | Admitting: Family

## 2015-02-25 ENCOUNTER — Ambulatory Visit: Payer: BLUE CROSS/BLUE SHIELD | Admitting: Family

## 2015-02-25 ENCOUNTER — Encounter: Payer: Self-pay | Admitting: Family

## 2015-02-28 ENCOUNTER — Telehealth: Payer: Self-pay | Admitting: Family

## 2015-02-28 NOTE — Telephone Encounter (Signed)
Patient dismissed from [facility] by Sandford CrazeMelissa O'Sullivan, NP effective February 25, 2015. Dismissal letter sent out by certified / registered mail on February 28, 2015. [facility]

## 2015-03-01 ENCOUNTER — Other Ambulatory Visit: Payer: Self-pay | Admitting: Family

## 2015-03-01 NOTE — Telephone Encounter (Signed)
Pt has f/u 03/04/15 and dismissal letter has been initiated.  Please advise.       Medication name:  Name from pharmacy:  cyclobenzaprine (FLEXERIL) 5 MG tablet CYCLOBENZAPRINE 5 MG TABLET    Sig: TAKE 1 TABLET BY MOUTH 3 TIMES DAILY AS NEEDED FOR MUSCLE SPASM.    Dispense: 60 tablet   Refills: 0   Start: 03/01/2015   Class: Normal    Requested on: 01/29/2015    Originally ordered on: 10/23/2013 01/29/2015

## 2015-03-01 NOTE — Telephone Encounter (Signed)
This visit should be cancelled- we are available on an emergency basis only at this point.

## 2015-03-04 ENCOUNTER — Ambulatory Visit (INDEPENDENT_AMBULATORY_CARE_PROVIDER_SITE_OTHER): Payer: BLUE CROSS/BLUE SHIELD | Admitting: Family

## 2015-03-04 ENCOUNTER — Encounter: Payer: Self-pay | Admitting: Family

## 2015-03-04 ENCOUNTER — Telehealth: Payer: Self-pay | Admitting: Family

## 2015-03-04 VITALS — BP 130/86 | HR 77 | Temp 98.4°F | Resp 16 | Ht 65.0 in | Wt 288.0 lb

## 2015-03-04 DIAGNOSIS — G4700 Insomnia, unspecified: Secondary | ICD-10-CM | POA: Diagnosis not present

## 2015-03-04 DIAGNOSIS — F329 Major depressive disorder, single episode, unspecified: Secondary | ICD-10-CM | POA: Diagnosis not present

## 2015-03-04 DIAGNOSIS — F32A Depression, unspecified: Secondary | ICD-10-CM

## 2015-03-04 DIAGNOSIS — I10 Essential (primary) hypertension: Secondary | ICD-10-CM

## 2015-03-04 DIAGNOSIS — R739 Hyperglycemia, unspecified: Secondary | ICD-10-CM

## 2015-03-04 NOTE — Progress Notes (Signed)
Subjective:    Patient ID: Melinda Krause, female    DOB: 05/06/1961, 54 y.o.   MRN: [id number]  HPI   Melinda Krause is a 54 yr old female who presents today for routine follow up.    1) HTN-  Patient is currently maintained on the following medications for blood pressure: amlodipine, clonidine, hctz, losartan, toprol xl Patient reports good compliance with blood pressure medications. Patient denies chest pain, shortness of breath or swelling. Last 3 blood pressure readings in our office are as follows: BP Readings from Last 3 Encounters:  03/04/15 130/86  11/12/14 137/73  09/12/14 164/83   2) Depression- maintained on wellbutrin, effexor. She is following with [facility] who increased her effexor. She is working with a Paramedictherapist, still not getting out of the house much.    3) Insomnia- on ambien 10mg - sleeps 4-5 hours at a time.      Review of Systems See HPI  Past Medical History  Diagnosis Date  . History of chicken pox     childhood  . Depression     [facility]ing-[facility]  . Headache(784.0)     daily  . Peptic ulcer   . Seasonal allergies   . Migraine     1-2 per month  . Hypothyroidism   . Fibromyalgia     Rheumatologist- Dr. Ardis HughsShali Devashwar  . Hypertension   . Hyperlipidemia   . Pain in joint, shoulder region 04/02/2013    left  . Neck injury     C-5-6  . Non compliance with medical treatment 04/02/2014  . Morbid obesity with BMI of 45.0-49.9, adult     History   Social History  . Marital Status: Married    Spouse Name: Harolyn RutherfordLucian  . Number of Children: 3  . 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 Smoking status: Never Smoker   . Smokeless tobacco: Never Used  . Alcohol Use: No  . Drug Use: No  . Sexual Activity: Yes    Birth Control/ Protection: None   Other Topics Concern  . Not on file   Social History Narrative   Patient is married [name]([name]) and lives at home with her husband. Three children.   Patient has a Bachelor's degree.  Is currently employed.   Patient is right-handed.   Patient drinks 2-3 Diet sodas daily.   Does not Drink EtOH or smoke    Past Surgical History  Procedure Laterality Date  . Vaginal hysterectomy  2003  . Cesarean section  1989  . Rectocele repair  2009  . Vagina reconstruction surgery  2011    vaginal mesh repair  . Colonoscopy    . Upper gastrointestinal endoscopy    . Removal of vaginal mesh  11/2012  . Nm myoview ltd  04/18/2014    LexiScan: EF 60%; no evidence of ischemia or infarction. Normal stress test to  . Transthoracic echocardiogram  04/18/2014    EF 55-60%. Difficult to assess for regional wall motion due to poor quality.  Normal diastolic pressures/function. No significant valvular lesions.    Family History  Problem Relation Age of Onset  . Emphysema Mother   . Allergies Mother   . Asthma Mother   . Cancer Mother   . Heart disease Mother   . Hypertension Mother   . Heart disease Father   . Hypertension Father   .  Rheum arthritis Maternal Grandfather   . Cancer Maternal Grandmother   . Hypertension Paternal Grandmother   . Heart disease Paternal Grandmother   . Stroke Paternal Grandmother   . Heart disease Paternal Grandfather   . Hypertension Son   . Arrhythmia Son     Allergies  Allergen Reactions  . Demerol     Irritability, and shortness of breath    Current Outpatient Prescriptions on File Prior to Visit  Medication Sig Dispense Refill  . amLODipine (NORVASC) 10 MG tablet Take 1 tablet (10 mg total) by mouth daily. 30 tablet 3  . buPROPion (WELLBUTRIN SR) 150 MG 12 hr tablet TAKE ONE TAB BY MOUTH TWICE DAILY 60 tablet 2  . Cholecalciferol (VITAMIN D-1000 MAX ST) 1000 UNITS tablet Take 1 tablet by mouth daily.    . cloNIDine (CATAPRES) 0.1 MG tablet Take 1 tablet (0.1 mg total) by mouth 2 (two) times daily. 60 tablet 2  . FERROUS  SULFATE PO 1 PO QD W/VIT C-TO INCREASE ABSORPTION    . hydrochlorothiazide (HYDRODIURIL) 25 MG tablet Take 1 tablet (25 mg total) by mouth daily. 30 tablet 2  . levothyroxine (SYNTHROID, LEVOTHROID) 150 MCG tablet TAKE 1 TABLET BY MOUTH EVERY DAY 90 tablet 0  . loratadine (CLARITIN) 10 MG tablet Take 10 mg by mouth daily as needed for allergies.    Marland Kitchen losartan (COZAAR) 100 MG tablet Take 1 tablet (100 mg total) by mouth daily. 30 tablet 11  . metoprolol succinate (TOPROL-XL) 100 MG 24 hr tablet TAKE 2 TABLETS BY MOUTH EVERY DAY, TAKE WITH OR IMMEDIATELY AFTER A MEAL 60 tablet 0  . Multiple Vitamins-Minerals (MULTIVITAMIN PO) Take 1 tablet by mouth daily.    . naproxen sodium (ANAPROX) 220 MG tablet Take 220 mg by mouth 2 (two) times daily with a meal.    . omeprazole (PRILOSEC) 40 MG capsule Take 1 capsule (40 mg total) by mouth daily. 30 capsule 5  . venlafaxine XR (EFFEXOR-XR) 150 MG 24 hr capsule TAKE 1 CAPSULE (150 MG TOTAL) BY MOUTH DAILY WITH BREAKFAST. FOR ANXIETY AND DEPRESSION. (Patient taking differently: TAKE 2 CAPSULE (150 MG TOTAL) BY MOUTH DAILY WITH BREAKFAST. FOR ANXIETY AND DEPRESSION.) 30 capsule 2  . zolpidem (AMBIEN) 10 MG tablet TAKE 1 TABLET BY MOUTH AT BEDTIME AS NEEDED FOR SLEEP 30 tablet 0  . cyclobenzaprine (FLEXERIL) 5 MG tablet TAKE 1 TABLET BY MOUTH 3 TIMES DAILY AS NEEDED FOR MUSCLE SPASM. 60 tablet 0   No current facility-administered medications on file prior to visit.    BP 130/86 mmHg  Pulse 77  Temp(Src) 98.4 F (36.9 C) (Oral)  Resp 16  Ht  (1.651 m)  Wt 288 lb (130.636 kg)  BMI 47.93 kg/m2  SpO2 99%       Objective:   Physical Exam  Constitutional: She is oriented to person, place, and time. She appears well-developed and well-nourished.  HENT:  Head: Normocephalic and atraumatic.  Cardiovascular: Normal rate, regular rhythm and normal heart sounds.   No murmur heard. Pulmonary/Chest: Effort normal and breath sounds normal. No respiratory  distress. She has no wheezes.  Musculoskeletal: She exhibits no edema.  Neurological: She is alert and oriented to person, place, and time.  Psychiatric: She has a normal mood and affect. Her behavior is normal. Judgment and thought content normal.          Assessment & Plan:

## 2015-03-04 NOTE — Telephone Encounter (Signed)
No

## 2015-03-04 NOTE — Telephone Encounter (Signed)
Pt was no show for appt on 02/25/15- per chart pt was dismissed 02/25/15- send charge?

## 2015-03-04 NOTE — Progress Notes (Signed)
Pre visit review using our clinic review tool, if applicable. No additional management support is needed unless otherwise documented below in the visit note. 

## 2015-03-04 NOTE — Telephone Encounter (Signed)
Pt appt did not cancelled. Pt arrived for appt. Rx has been printed.

## 2015-03-04 NOTE — Patient Instructions (Signed)
Please complete lab work prior to leaving.   

## 2015-03-04 NOTE — Assessment & Plan Note (Addendum)
bp stable on current meds, continue same.  

## 2015-03-04 NOTE — Assessment & Plan Note (Signed)
   She is concerned about possibility of diabetes. She has had some mild hyperglycemia in the past, obtain a1c.

## 2015-03-04 NOTE — Assessment & Plan Note (Signed)
Some improvement with ambien.

## 2015-03-04 NOTE — Assessment & Plan Note (Signed)
Improving some- being managed by psychiatry.  I did tell pt that a a practice dismissal due to excessive no-shows has been mailed out to her home.  Today's visit was not cancelled.  She verbalizes understanding.

## 2015-03-05 NOTE — Addendum Note (Signed)
Addended by: Silvio PateHOMPSON, Keliyah Spillman D on: 03/05/2015 08:44 AM   Modules accepted: Orders

## 2015-03-21 ENCOUNTER — Other Ambulatory Visit: Payer: Self-pay | Admitting: Family

## 2015-03-28 ENCOUNTER — Ambulatory Visit (INDEPENDENT_AMBULATORY_CARE_PROVIDER_SITE_OTHER): Payer: BLUE CROSS/BLUE SHIELD | Admitting: Neurology

## 2015-03-28 ENCOUNTER — Encounter: Payer: Self-pay | Admitting: Neurology

## 2015-03-28 VITALS — BP 148/92 | HR 68 | Resp 16 | Ht 65.0 in | Wt 290.0 lb

## 2015-03-28 DIAGNOSIS — F329 Major depressive disorder, single episode, unspecified: Secondary | ICD-10-CM

## 2015-03-28 DIAGNOSIS — G4700 Insomnia, unspecified: Secondary | ICD-10-CM

## 2015-03-28 DIAGNOSIS — G4733 Obstructive sleep apnea (adult) (pediatric): Secondary | ICD-10-CM

## 2015-03-28 DIAGNOSIS — M797 Fibromyalgia: Secondary | ICD-10-CM

## 2015-03-28 DIAGNOSIS — F411 Generalized anxiety disorder: Secondary | ICD-10-CM | POA: Diagnosis not present

## 2015-03-28 DIAGNOSIS — F32A Depression, unspecified: Secondary | ICD-10-CM

## 2015-03-28 MED ORDER — LORAZEPAM 1 MG PO TABS: 1.0000 mg | ORAL_TABLET | Freq: Every day | ORAL | Status: DC

## 2015-03-28 NOTE — Progress Notes (Signed)
[facility]  PATIENT: Melinda Krause DOB: 1961/06/27  REFERRING DOCTOR OR PCP:  Sandford Craze SOURCE: patient and EMR  _________________________________   HISTORICAL  CHIEF COMPLAINT:  Chief Complaint  Patient presents with  . Sleep Apnea    Sts. she was first dx. with osa in the mid 2000's by Dr. Epimenio Foot at [facility]. She has a cpap machine but has been unable to tolerate it--sts. mask is heavy and bulky.  She also has insomnia (treated by her psychiatrist Dr. Wellington Hampshire).  She is taking Ambien  qhs for same./fim    HISTORY OF PRESENT ILLNESS:  OSA:   She was diagnosed many year ago around 2007.   She had a lot of difficulty using CPAP with problems getting comfortable.   She did best with nasal pillows but sometimes breathes through her mouth so was switched to a FF mask.      She was never able to use it for 4 hours on average a night.     I evaluated data in her CPAP machine. She did not use it last night. She is currently on Auto-PAP 5-12 and I changed to to 5-10 today.       Insomnia:   She has difficulty falling asleep and staying asleep.   Falling asleep is more difficult due to a lot of thoughts going through her mind.  Ambien helps but she always takes her mask off if she uses it.    Halcion worked best.  Mood:  She has depression and anxiety.    Her psychiatrist has her on Wellbutrin and Effexor and doses were increased   She is apathetic and withdrawn  FMS:    She has a lot of myalgias in the proximal muscles.  Pain is worse after a bad night's rest.    REVIEW OF SYSTEMS: Constitutional: No fevers, chills, sweats, or change in appetite.  She notes fatigue and insomnia. Eyes: No visual changes, double vision, eye pain Ear, nose and throat: No hearing loss, ear pain, nasal congestion, sore throat Cardiovascular: No chest pain, palpitations Respiratory: No shortness of breath at rest or with exertion.   No wheezes.   OSA on  CPAP GastrointestinaI: No nausea, vomiting, diarrhea, abdominal pain, fecal incontinence Genitourinary: No dysuria, urinary retention or frequency.  No nocturia. Musculoskeletal: Notes some neck pain, back pain Integumentary: No rash, pruritus, skin lesions Neurological: as above Psychiatric: Notes depression and anxiety at this time.  Endocrine: No palpitations, diaphoresis, change in appetite, change in weigh or increased thirst Hematologic/Lymphatic: No anemia, purpura, petechiae. Allergic/Immunologic: No itchy/runny eyes, nasal congestion, recent allergic reactions, rashes  ALLERGIES: Allergies  Allergen Reactions  . Demerol     Irritability, and shortness of breath    HOME MEDICATIONS:  Current outpatient prescriptions:  .  amLODipine (NORVASC) 10 MG tablet, Take 1 tablet (10 mg total) by mouth daily., Disp: 30 tablet, Rfl: 3 .  buPROPion (WELLBUTRIN SR) 150 MG 12 hr tablet, TAKE ONE TAB BY MOUTH TWICE DAILY, Disp: 60 tablet, Rfl: 2 .  Cholecalciferol (VITAMIN D-1000 MAX ST) 1000 UNITS tablet, Take 1 tablet by mouth daily., Disp: , Rfl:  .  cloNIDine (CATAPRES) 0.1 MG tablet, Take 1 tablet (0.1 mg total) by mouth 2 (two) times daily., Disp: 60 tablet, Rfl: 2 .  cyclobenzaprine (FLEXERIL) 5 MG tablet, TAKE 1 TABLET BY MOUTH 3 TIMES DAILY AS NEEDED FOR MUSCLE SPASM., Disp: 60 tablet, Rfl: 0 .  FERROUS SULFATE PO, 1 PO QD W/VIT  C-TO INCREASE ABSORPTION, Disp: , Rfl:  .  hydrochlorothiazide (HYDRODIURIL) 25 MG tablet, Take 1 tablet (25 mg total) by mouth daily., Disp: 30 tablet, Rfl: 2 .  levothyroxine (SYNTHROID, LEVOTHROID) 150 MCG tablet, TAKE 1 TABLET BY MOUTH EVERY DAY, Disp: 90 tablet, Rfl: 0 .  loratadine (CLARITIN) 10 MG tablet, Take 10 mg by mouth daily as needed for allergies., Disp: , Rfl:  .  losartan (COZAAR) 100 MG tablet, Take 1 tablet (100 mg total) by mouth daily., Disp: 30 tablet, Rfl: 11 .  metoprolol succinate (TOPROL-XL) 100 MG 24 hr tablet, TAKE 2 TABLETS BY  MOUTH EVERY DAY, TAKE WITH OR IMMEDIATELY AFTER A MEAL, Disp: 60 tablet, Rfl: 0 .  Multiple Vitamins-Minerals (MULTIVITAMIN PO), Take 1 tablet by mouth daily., Disp: , Rfl:  .  naproxen sodium (ANAPROX) 220 MG tablet, Take 220 mg by mouth 2 (two) times daily with a meal., Disp: , Rfl:  .  omeprazole (PRILOSEC) 40 MG capsule, TAKE 1 CAPSULE (40 MG TOTAL) BY MOUTH DAILY., Disp: 30 capsule, Rfl: 0 .  venlafaxine XR (EFFEXOR-XR) 150 MG 24 hr capsule, TAKE 1 CAPSULE (150 MG TOTAL) BY MOUTH DAILY WITH BREAKFAST. FOR ANXIETY AND DEPRESSION. (Patient taking differently: TAKE 2 CAPSULE (150 MG TOTAL) BY MOUTH DAILY WITH BREAKFAST. FOR ANXIETY AND DEPRESSION.), Disp: 30 capsule, Rfl: 2 .  zolpidem (AMBIEN) 10 MG tablet, TAKE 1 TABLET BY MOUTH AT BEDTIME AS NEEDED FOR SLEEP, Disp: 30 tablet, Rfl: 0  PAST MEDICAL HISTORY: Past Medical History  Diagnosis Date  . History of chicken pox     childhood  . Depression     Counseling-Dr [facility]  . Headache(784.0)     daily  . Peptic ulcer   . Seasonal allergies   . Migraine     1-2 per month  . Hypothyroidism   . Fibromyalgia     Rheumatologist- Dr. Ardis Hughs  . Hypertension   . Hyperlipidemia   . Pain in joint, shoulder region 04/02/2013    left  . Neck injury     C-5-6  . Non compliance with medical treatment 04/02/2014  . Morbid obesity with BMI of 45.0-49.9, adult     PAST SURGICAL HISTORY: Past Surgical History  Procedure Laterality Date  . Vaginal hysterectomy  2003  . Cesarean section  1989  . Rectocele repair  2009  . Vagina reconstruction surgery  2011    vaginal mesh repair  . Colonoscopy    . Upper gastrointestinal endoscopy    . Removal of vaginal mesh  11/2012  . Nm myoview ltd  04/18/2014    LexiScan: EF 60%; no evidence of ischemia or infarction. Normal stress test to  . Transthoracic echocardiogram  04/18/2014    EF 55-60%. Difficult to assess for regional wall motion due to poor quality.  Normal  diastolic pressures/function. No significant valvular lesions.    FAMILY HISTORY: Family History  Problem Relation Age of Onset  . Emphysema Mother   . Allergies Mother   . Asthma Mother   . Cancer Mother   . Heart disease Mother   . Hypertension Mother   . Heart disease Father   . Hypertension Father   . Rheum arthritis Maternal Grandfather   . Cancer Maternal Grandmother   . Hypertension Paternal Grandmother   . Heart disease Paternal Grandmother   . Stroke Paternal Grandmother   . Heart disease Paternal Grandfather   . Hypertension Son   . Arrhythmia Son     SOCIAL  HISTORY:  History   Social History  . Marital Status: Married    Spouse Name: Harolyn RutherfordLucian  . Number of Children: 3  . 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 Smoking status: Never Smoker   . Smokeless tobacco: Never Used  . Alcohol Use: No  . Drug Use: No  . Sexual Activity: Yes    Birth Control/ Protection: None   Other Topics Concern  . Not on file   Social History Narrative   Patient is married [name]([name]) and lives at home with her husband. Three children.   Patient has a Bachelor's degree.  Is currently employed.   Patient is right-handed.   Patient drinks 2-3 Diet sodas daily.   Does not Drink EtOH or smoke     PHYSICAL EXAM  Filed Vitals:   03/28/15 1459  BP: 148/92  Pulse: 68  Resp: 16  Height: 5\' 5"  (1.651 m)  Weight: 290 lb (131.543 kg)    Body mass index is 48.26 kg/(m^2).   General: The patient is well-developed and well-nourished and in no acute distress  Neck: The neck is supple, no carotid bruits are noted.  The neck is mildly tender.  Cardiovascular: The heart has a regular rate and rhythm with a normal S1 and S2. There were no murmurs, gallops or rubs. Lungs are clear to auscultation.  Skin: Extremities are without significant edema.  Musculoskeletal:  She has mild tenderness over the  muscles of the upper chest and back and some of the classic FMS tender points.  Neurologic Exam  Mental status: The patient is alert and oriented x 3 at the time of the examination. The patient has apparent normal recent and remote memory, with an apparently normal attention span and concentration ability.   Speech is normal.  Cranial nerves: Extraocular movements are full. Facial symmetry is present. There is good facial sensation to soft touch bilaterally.Facial strength is normal.  Trapezius and sternocleidomastoid strength is normal. No dysarthria is noted.  The tongue is midline, and the patient has symmetric elevation of the soft palate. No obvious hearing deficits are noted.  Motor:  Muscle bulk is normal.   Tone is normal. Strength is  5 / 5 in all 4 extremities.   Sensory: Sensory testing is intact to  touch and vibration sensation in all 4 extremities.  Coordination: Cerebellar testing reveals good finger-nose-finger and heel-to-shin bilaterally.  Gait and station: Station is normal.   Gait is normal. Tandem gait is normal.   Reflexes: Deep tendon reflexes are symmetric and normal bilateral.    DIAGNOSTIC DATA (LABS, IMAGING, TESTING) - I reviewed patient records, labs, notes, testing and imaging myself where available.  Lab Results  Component Value Date   WBC 6.3 09/12/2014   HGB 15.2* 09/12/2014   HCT 45.0 09/12/2014   MCV 90.9 09/12/2014   PLT 361.0 09/12/2014      Component Value Date/Time   NA 139 02/16/2014 0932   K 3.7 02/16/2014 0932   CL 103 02/16/2014 0932   CO2 27 02/16/2014 0920   GLUCOSE 107* 02/16/2014 0932   BUN 13 02/16/2014 0932   CREATININE 0.70 02/16/2014 0932   CREATININE 0.87 11/03/2013 1452   CALCIUM 9.2 02/16/2014 0920   PROT 6.3 02/16/2014 0920   ALBUMIN 3.4* 02/16/2014 0920   AST 20 02/16/2014 0920   ALT 18 02/16/2014 0920   ALKPHOS  99 02/16/2014 0920   BILITOT 0.6 02/16/2014 0920   GFRNONAA >90 02/16/2014 0920   GFRAA >90 02/16/2014  0920   Lab Results  Component Value Date   CHOL 172 08/09/2013   HDL 31* 08/09/2013   LDLCALC 98 08/09/2013   TRIG 215* 08/09/2013   CHOLHDL 5.5 08/09/2013   Lab Results  Component Value Date   HGBA1C 5.5 08/08/2013   No results found for: VITAMINB12 Lab Results  Component Value Date   TSH 2.86 09/12/2014       ASSESSMENT AND PLAN OSA (obstructive sleep apnea)  Insomnia  Depression  Generalized anxiety disorder  Fibromyalgia   1.   Adjust CPAP to APAP 5-10 (was 5-12).  Consider BiPAP.   Change to nasal pillows 2.   Ativan qHS for insomnia and night time anxiety 3.   rtc 3 months, sooner if problems   Claudia Alvizo A. Epimenio FootSater, MD, PhD 03/28/2015, 3:19 PM Certified in Neurology, Clinical Neurophysiology, Sleep Medicine, Pain Medicine and Neuroimaging  [facility] [address], [address] [place], [place] [address] [phone]

## 2015-04-02 ENCOUNTER — Other Ambulatory Visit: Payer: Self-pay | Admitting: Family

## 2015-04-02 NOTE — Telephone Encounter (Signed)
Certified dismissal letter returned on Apr 01, 2015 as undeliverable, unclaimed, return to sender after three attempts by [employer]. Letter placed in another envelope and resent as 1st class mail on Apr 02, 2015 which does not require a signature. DJC

## 2015-04-11 ENCOUNTER — Telehealth: Payer: Self-pay | Admitting: Neurology

## 2015-04-11 NOTE — Telephone Encounter (Signed)
Patient called and stated that her medication Rx. LORazepam (ATIVAN) 1 MG tablet is not working, and she would like to know if she can get something stronger. Please call and advise.

## 2015-04-11 NOTE — Telephone Encounter (Signed)
Lorazepam is fairly strong but we can try a higher dose   (2 mg)  Nightly  Script   #30 with 3 refills

## 2015-04-15 NOTE — Telephone Encounter (Signed)
Attempted to contact pt. at cell # [phone].  LMTC/fim

## 2015-04-19 NOTE — Telephone Encounter (Signed)
Melinda Krause has not responded to mult. messages.  I will wait for her to contact our office again/fim

## 2015-05-14 ENCOUNTER — Telehealth: Payer: Self-pay | Admitting: *Deleted

## 2015-05-14 NOTE — Telephone Encounter (Signed)
Pt signed ROI received via fax from [facility]: Dr. Doreatha Martin. Forwarded to [place] to scan/email to medical records. JG//CMA

## 2015-05-28 ENCOUNTER — Other Ambulatory Visit: Payer: Self-pay | Admitting: Family

## 2015-06-08 ENCOUNTER — Other Ambulatory Visit: Payer: Self-pay | Admitting: Family

## 2015-07-02 ENCOUNTER — Ambulatory Visit: Payer: BLUE CROSS/BLUE SHIELD | Admitting: Neurology

## 2015-07-19 ENCOUNTER — Ambulatory Visit: Payer: BLUE CROSS/BLUE SHIELD | Admitting: Neurology

## 2015-07-19 ENCOUNTER — Encounter: Payer: Self-pay | Admitting: Neurology

## 2015-11-24 DIAGNOSIS — E882 Lipomatosis, not elsewhere classified: Secondary | ICD-10-CM

## 2015-11-24 HISTORY — DX: Lipomatosis, not elsewhere classified: E88.2

## 2016-09-03 ENCOUNTER — Ambulatory Visit (INDEPENDENT_AMBULATORY_CARE_PROVIDER_SITE_OTHER): Payer: BLUE CROSS/BLUE SHIELD | Admitting: Orthopaedic Surgery

## 2016-09-07 ENCOUNTER — Ambulatory Visit (INDEPENDENT_AMBULATORY_CARE_PROVIDER_SITE_OTHER): Payer: BLUE CROSS/BLUE SHIELD | Admitting: Orthopaedic Surgery

## 2016-09-07 DIAGNOSIS — M25572 Pain in left ankle and joints of left foot: Secondary | ICD-10-CM | POA: Diagnosis not present

## 2016-09-07 DIAGNOSIS — S93492A Sprain of other ligament of left ankle, initial encounter: Secondary | ICD-10-CM

## 2016-10-09 ENCOUNTER — Ambulatory Visit (INDEPENDENT_AMBULATORY_CARE_PROVIDER_SITE_OTHER): Payer: BLUE CROSS/BLUE SHIELD | Admitting: Psychiatry

## 2016-10-09 ENCOUNTER — Encounter: Payer: Self-pay | Admitting: Psychiatry

## 2016-10-09 VITALS — BP 161/90 | HR 65 | Temp 98.3°F | Resp 16 | Wt 295.0 lb

## 2016-10-09 DIAGNOSIS — F332 Major depressive disorder, recurrent severe without psychotic features: Secondary | ICD-10-CM

## 2016-10-09 NOTE — Progress Notes (Signed)
ECT: ECT consult for this 55 year old woman with a history of depression. Patient interviewed. She brought a referral note from her outpatient psychiatrist as well. Patient reports she has had depressive symptoms for many years now. Consistently depressed for over 2 years. Mood stays down and sad most of the time. Does not interact much with other people. Socially withdrawn. Does not enjoy doing things she used to do. Sleeps poorly with frequent wakening. Low energy. No active suicidal thoughts or plan. She is currently being treated with medication and psychotherapy and has been for a long time. She is currently on Effexor and several other medications for depression and has been on them for at least months without improvement. Multiple other medicines have been tried in the past without good effect. Referral for ECT treatment.  Medical history: History of hypothyroidism, sleep apnea, high blood pressure, chronic pain from fibromyalgia type pain, gastric reflux. No history of heart attacks and strokes or kidney disease. She has had general anesthesia in the past without complication.  Substance abuse history: Denies use of alcohol or other drugs. Denies past substance abuse issues.  Social history: Lives with her husband.Lisabeth Devoid. Thinks he is supportive but they are not apparently very emotionally close. 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  Family history: Denies any family history of any mental health problems at all.  Mental status exam: Neatly dressed woman cooperative with interview. Intermittent eye contact. Affect blunt and dysphoric. Mood stated as depressed. Speech slowed but thoughts appear to be lucid without any sign of thought blocking. Alert and oriented 4. Short and long-term memory intact. Denies active suicidal thoughts. No homicidal thoughts. No hallucinations or delusions. Patient appears to have a clear understanding of medical procedures and  inability to make informed consent.  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 Mood stays down and depressed and severely impaired with severely impaired functioning. Patient would be a good candidate for ECT. No serious  ECT was described to the patient and she was given ample opportunity to ask questions. Patient stated a tentative agreement to begin ECT treatment. We reviewed the plan and we will target for approximately November 27. Patient was given the laboratory order form which was also faxed in to get the anesthesia workup. I will send an email to ECT team and utilization review. Patient knows that she can contact me if needed for any new information or change to plans.

## 2016-10-13 ENCOUNTER — Encounter
Admission: RE | Admit: 2016-10-13 | Discharge: 2016-10-13 | Disposition: A | Discharge status: Home / Self Care | Payer: BLUE CROSS/BLUE SHIELD | Source: Ambulatory Visit | Attending: Psychiatry | Admitting: Psychiatry

## 2016-10-13 ENCOUNTER — Ambulatory Visit
Admission: RE | Admit: 2016-10-13 | Discharge: 2016-10-13 | Disposition: A | Discharge status: Home / Self Care | Payer: BLUE CROSS/BLUE SHIELD | Source: Ambulatory Visit | Attending: Psychiatry | Admitting: Psychiatry

## 2016-10-13 DIAGNOSIS — Z01818 Encounter for other preprocedural examination: Secondary | ICD-10-CM

## 2016-10-13 DIAGNOSIS — Z01810 Encounter for preprocedural cardiovascular examination: Secondary | ICD-10-CM | POA: Insufficient documentation

## 2016-10-13 DIAGNOSIS — Z01812 Encounter for preprocedural laboratory examination: Secondary | ICD-10-CM | POA: Insufficient documentation

## 2016-10-13 DIAGNOSIS — F332 Major depressive disorder, recurrent severe without psychotic features: Secondary | ICD-10-CM | POA: Diagnosis not present

## 2016-10-13 HISTORY — DX: Gastro-esophageal reflux disease without esophagitis: K21.9

## 2016-10-13 LAB — URINALYSIS COMPLETE WITH MICROSCOPIC (ARMC ONLY)
Bacteria, UA: NONE SEEN
Bilirubin Urine: NEGATIVE
Glucose, UA: NEGATIVE mg/dL
Hgb urine dipstick: NEGATIVE
Ketones, ur: NEGATIVE mg/dL
Leukocytes, UA: NEGATIVE
Nitrite: NEGATIVE
Protein, ur: NEGATIVE mg/dL
Specific Gravity, Urine: 1.013 (ref 1.005–1.030)
pH: 6 (ref 5.0–8.0)

## 2016-10-13 LAB — BASIC METABOLIC PANEL
Anion gap: 6 (ref 5–15)
BUN: 12 mg/dL (ref 6–20)
CO2: 30 mmol/L (ref 22–32)
Calcium: 9 mg/dL (ref 8.9–10.3)
Chloride: 100 mmol/L — ABNORMAL LOW (ref 101–111)
Creatinine, Ser: 0.84 mg/dL (ref 0.44–1.00)
GFR calc Af Amer: >60 mL/min (ref >60)
GFR calc non Af Amer: >60 mL/min (ref >60)
Glucose, Bld: 114 mg/dL — ABNORMAL HIGH (ref 65–99)
Potassium: 4.1 mmol/L (ref 3.5–5.1)
Sodium: 136 mmol/L (ref 135–145)

## 2016-10-13 LAB — CBC
HCT: 40.4 % (ref 35.0–47.0)
Hemoglobin: 14.2 g/dL (ref 12.0–16.0)
MCH: 31.1 pg (ref 26.0–34.0)
MCHC: 35.1 g/dL (ref 32.0–36.0)
MCV: 88.6 fL (ref 80.0–100.0)
Platelets: 379 10*3/uL (ref 150–440)
RBC: 4.56 MIL/uL (ref 3.80–5.20)
RDW: 14 % (ref 11.5–14.5)
WBC: 6 10*3/uL (ref 3.6–11.0)

## 2016-10-13 NOTE — Patient Instructions (Addendum)
  Your procedure is scheduled on: 10/19/16 Mon Report to Same Day Surgery 2nd floor medical mall To find out your arrival time please call [phone] between 1PM - 3PM on   Remember: Instructions that are not followed completely may result in serious medical risk, up to and including death, or upon the discretion of your surgeon and anesthesiologist your surgery may need to be rescheduled.    _x___ 1. Do not eat food or drink liquids after midnight. No gum chewing or hard candies.     __x__ 2. No Alcohol for 24 hours before or after surgery.   __x__3. No Smoking for 24 prior to surgery.   ____  4. Bring all medications with you on the day of surgery if instructed.    __x__ 5. Notify your doctor if there is any change in your medical condition     (cold, fever, infections).     Do not wear jewelry, make-up, hairpins, clips or nail polish.  Do not wear lotions, powders, or perfumes. You may wear deodorant.  Do not shave 48 hours prior to surgery. Men may shave face and neck.  Do not bring valuables to the hospital.    [facility] not [facility] for any belongings or valuables.               Contacts, dentures or bridgework may not be worn into surgery.  Leave your suitcase in the car. After surgery it may be brought to your room.  For patients admitted to the hospital, discharge time is determined by your treatment team.   Patients discharged the day of surgery will not be allowed to drive home.  You will need someone to drive you home and stay with you the night of your procedure.    Please read over the following fact sheets that you were given:   [facility] Preparing for [facility] and or MRSA Information   _x___ Take these medicines the morning of surgery with A SIP OF WATER:    1. cloNIDine (CATAPRES) 0.1 MG tablet  2.levothyroxine (SYNTHROID, LEVOTHROID) 175 MCG tablet  3  4.losartan (COZAAR) 100 MG tablet  5.metoprolol succinate (TOPROL-XL) 100 MG 24 hr  tablet  6.omeprazole (PRILOSEC) 40 MG capsule    ____Fleets enema or Magnesium Citrate as directed.   _x___ Use CHG Soap or sage wipes as directed on instruction sheet   ____ Use inhalers on the day of surgery and bring to hospital day of surgery  ____ Stop metformin 2 days prior to surgery    ____ Take 1/2 of usual insulin dose the night before surgery and none on the morning of           surgery.   ____ Stop Aspirin, Coumadin, Pllavix ,Eliquis, Effient, or Pradaxa  x__ Stop Anti-inflammatories such as Advil, Aleve, Ibuprofen, Motrin, Naproxen,          Naprosyn, Goodies powders or aspirin products. Ok to take Tylenol.   ____ Stop supplements until after surgery.    ____ Bring C-Pap to the hospital.   _x____No Ativan after 5 pm the night before and no anti-anxiety meds the night before.

## 2016-10-14 ENCOUNTER — Other Ambulatory Visit: Payer: Self-pay | Admitting: Psychiatry

## 2016-10-14 ENCOUNTER — Telehealth: Payer: Self-pay | Admitting: *Deleted

## 2016-10-14 NOTE — Pre-Procedure Instructions (Signed)
LABS WITH ABNORMAL UA FAXED TO [facility]

## 2016-10-19 ENCOUNTER — Encounter
Admission: RE | Admit: 2016-10-19 | Discharge: 2016-10-19 | Disposition: A | Discharge status: Home / Self Care | Payer: BLUE CROSS/BLUE SHIELD | Source: Ambulatory Visit | Attending: Psychiatry | Admitting: Psychiatry

## 2016-10-19 ENCOUNTER — Encounter: Payer: Self-pay | Admitting: Anesthesiology

## 2016-10-19 ENCOUNTER — Other Ambulatory Visit: Payer: Self-pay | Admitting: Psychiatry

## 2016-10-19 DIAGNOSIS — F338 Other recurrent depressive disorders: Secondary | ICD-10-CM | POA: Diagnosis present

## 2016-10-19 DIAGNOSIS — K219 Gastro-esophageal reflux disease without esophagitis: Secondary | ICD-10-CM | POA: Diagnosis not present

## 2016-10-19 DIAGNOSIS — I10 Essential (primary) hypertension: Secondary | ICD-10-CM | POA: Diagnosis not present

## 2016-10-19 DIAGNOSIS — E785 Hyperlipidemia, unspecified: Secondary | ICD-10-CM | POA: Insufficient documentation

## 2016-10-19 DIAGNOSIS — F332 Major depressive disorder, recurrent severe without psychotic features: Secondary | ICD-10-CM

## 2016-10-19 DIAGNOSIS — Z809 Family history of malignant neoplasm, unspecified: Secondary | ICD-10-CM | POA: Insufficient documentation

## 2016-10-19 DIAGNOSIS — M797 Fibromyalgia: Secondary | ICD-10-CM | POA: Insufficient documentation

## 2016-10-19 DIAGNOSIS — Z8249 Family history of ischemic heart disease and other diseases of the circulatory system: Secondary | ICD-10-CM | POA: Diagnosis not present

## 2016-10-19 DIAGNOSIS — Z6841 Body Mass Index (BMI) 40.0 and over, adult: Secondary | ICD-10-CM | POA: Diagnosis not present

## 2016-10-19 DIAGNOSIS — Z9889 Other specified postprocedural states: Secondary | ICD-10-CM | POA: Diagnosis not present

## 2016-10-19 DIAGNOSIS — Z8711 Personal history of peptic ulcer disease: Secondary | ICD-10-CM | POA: Diagnosis not present

## 2016-10-19 DIAGNOSIS — Z823 Family history of stroke: Secondary | ICD-10-CM | POA: Diagnosis not present

## 2016-10-19 DIAGNOSIS — Z825 Family history of asthma and other chronic lower respiratory diseases: Secondary | ICD-10-CM | POA: Insufficient documentation

## 2016-10-19 DIAGNOSIS — E039 Hypothyroidism, unspecified: Secondary | ICD-10-CM | POA: Diagnosis not present

## 2016-10-19 DIAGNOSIS — Z888 Allergy status to other drugs, medicaments and biological substances status: Secondary | ICD-10-CM | POA: Insufficient documentation

## 2016-10-19 MED ORDER — SUCCINYLCHOLINE CHLORIDE 20 MG/ML IJ SOLN
INTRAMUSCULAR | Status: DC | PRN
  Administered 2016-10-19: 150 mg via INTRAVENOUS

## 2016-10-19 MED ORDER — SODIUM CHLORIDE 0.9 % IV SOLN
500.0000 mL | Freq: Once | INTRAVENOUS | Status: AC
  Administered 2016-10-19: 500 mL via INTRAVENOUS

## 2016-10-19 MED ORDER — KETOROLAC TROMETHAMINE 30 MG/ML IJ SOLN
INTRAMUSCULAR | Status: DC | PRN
  Administered 2016-10-19: 30 mg via INTRAVENOUS

## 2016-10-19 MED ORDER — LABETALOL HCL 5 MG/ML IV SOLN
INTRAVENOUS | Status: DC | PRN
  Administered 2016-10-19 (×2): 10 mg via INTRAVENOUS

## 2016-10-19 MED ORDER — LABETALOL HCL 5 MG/ML IV SOLN
10.0000 mg | Freq: Once | INTRAVENOUS | Status: AC
  Administered 2016-10-19: 10 mg via INTRAVENOUS

## 2016-10-19 MED ORDER — METHOHEXITAL SODIUM 100 MG/10ML IV SOSY
PREFILLED_SYRINGE | INTRAVENOUS | Status: DC | PRN
  Administered 2016-10-19: 140 mg via INTRAVENOUS

## 2016-10-19 MED ORDER — SODIUM CHLORIDE 0.9 % IV SOLN
INTRAVENOUS | Status: DC | PRN
  Administered 2016-10-19: 10:00:00 via INTRAVENOUS

## 2016-10-19 NOTE — Anesthesia Procedure Notes (Signed)
Date/Time: 10/19/2016 10:11 AM Performed by: Stormy FabianURTIS, Kinberly Perris Pre-anesthesia Checklist: Patient identified, Emergency Drugs available, Suction available and Patient being monitored Patient Re-evaluated:Patient Re-evaluated prior to inductionOxygen Delivery Method: Circle system utilized Preoxygenation: Pre-oxygenation with 100% oxygen Intubation Type: IV induction Ventilation: Mask ventilation without difficulty and Mask ventilation throughout procedure Airway Equipment and Method: Bite block Placement Confirmation: positive ETCO2 Dental Injury: Teeth and Oropharynx as per pre-operative assessment

## 2016-10-19 NOTE — Transfer of Care (Signed)
Immediate Anesthesia Transfer of Care Note  Patient: Melinda Krause  Procedure(s) Performed: * No procedures listed *  Patient Location: PACU  Anesthesia Type:General  Level of Consciousness: sedated  Airway & Oxygen Therapy: Patient Spontanous Breathing and Patient connected to face mask oxygen  Post-op Assessment: Report given to RN and Post -op Vital signs reviewed and stable  Post vital signs: Reviewed and stable  Last Vitals:  Vitals:   10/19/16 0943 10/19/16 1035  BP: 136/75 (!) 203/108  Pulse:  97  Resp:  12  Temp:  (!) 36 C    Complications: No apparent anesthesia complications

## 2016-10-19 NOTE — Procedures (Addendum)
ECT SERVICES Physician's Interval Evaluation & Treatment Note  Patient Identification: Melinda Krause MRN:  [id number] Date of Evaluation:  10/19/2016 TX #: 1  MADRS: 30 MMSE: 30   P.E. Findings:  Blood pressure slightly elevated initially. Lungs clear heart normal  Psychiatric Interval Note:  Mood depressed not psychotic not it usually suicidal  Subjective:  Patient is a 55 y.o. female seen for evaluation for Electroconvulsive Therapy. Depression  Treatment Summary:   [x]   Right Unilateral             []  Bilateral   % Energy : 0.3 ms 55%   Impedance: 1190 ohms  Seizure Energy Index: 5729 V squared  Postictal Suppression Index: 77%  Seizure Concordance Index: 91%  Medications  Pre Shock: Brevital 140 mg, succinylcholine 150 mg  Post Shock:  Toradol 30 mg  Seizure Duration: 19 seconds by EMG, 44 seconds by EEG   Comments: Increased dose of succinylcholine to 200 mg next time because of excess movement. Otherwise treatment went well.   Lungs:  [x]   Clear to auscultation               []  Other:   Heart:    [x]   Regular rhythm             []  irregular rhythm    [x]   Previous H&P reviewed, patient examined and there are NO CHANGES                 []   Previous H&P reviewed, patient examined and there are changes noted.   Mordecai RasmussenJohn Lake Breeding, MD 11/27/201710:18 AM

## 2016-10-19 NOTE — Anesthesia Preprocedure Evaluation (Signed)
Anesthesia Evaluation  Patient identified by MRN, date of birth, ID band Patient awake    Reviewed: Allergy & Precautions, NPO status , Patient's Chart, lab work & pertinent test results  History of Anesthesia Complications Negative for: history of anesthetic complications  Airway Mallampati: II  TM Distance: >3 FB Neck ROM: Full    Dental  (+) Poor Dentition, Missing   Pulmonary sleep apnea , neg COPD,    breath sounds clear to auscultation- rhonchi (-) wheezing      Cardiovascular Exercise Tolerance: Good hypertension, Pt. on medications (-) CAD, (-) Past MI and (-) Cardiac Stents  Rhythm:Regular Rate:Normal     Neuro/Psych  Headaches, Depression    GI/Hepatic Neg liver ROS, PUD, GERD  ,  Endo/Other  neg diabetesHypothyroidism   Renal/GU negative Renal ROS     Musculoskeletal  (+) Fibromyalgia -  Abdominal (+) + obese,   Peds  Hematology negative hematology ROS (+)   Anesthesia Other Findings Past Medical History: No date: Depression     Comment: Counseling-Dr Pam [facility] No date: Fibromyalgia     Comment: Rheumatologist- Dr. Shali Devashwar No date: GERD (gastroesophageal reflux disease) No date: Headache(784.0)     Comment: daily No date: History of chicken pox     Comment: childhood No date: Hyperlipidemia No date: Hypertension No date: Hypothyroidism No date: Migraine     Comment: 1-2 per month No date: Morbid obesity with BMI of 45.0-49.9, adult (H* No date: Neck injury     Comment: C-5-6 04/02/2014: Non compliance with medical treatment 04/02/2013: Pain in joint, shoulder region     Comment: left No date: Peptic ulcer No date: Seasonal allergies   Reproductive/Obstetrics                             Anesthesia Physical  Anesthesia Plan  ASA: III  Anesthesia Plan: General   Post-op Pain Management:    Induction: Intravenous  Airway  Management Planned: Mask  Additional Equipment:   Intra-op Plan:   Post-operative Plan:   Informed Consent: I have reviewed the patients History and Physical, chart, labs and discussed the procedure including the risks, benefits and alternatives for the proposed anesthesia with the patient or authorized representative who has indicated his/her understanding and acceptance.   Dental advisory given  Plan Discussed with: Anesthesiologist and CRNA  Anesthesia Plan Comments:         Anesthesia Quick Evaluation  

## 2016-10-19 NOTE — Anesthesia Postprocedure Evaluation (Signed)
Anesthesia Post Note  Patient: Marget F Blankenhorn  Procedure(s) Performed: * No procedures listed *  Patient location during evaluation: PACU Anesthesia Type: General Level of consciousness: awake and alert Pain management: pain level controlled Vital Signs Assessment: post-procedure vital signs reviewed and stable Respiratory status: spontaneous breathing, nonlabored ventilation and respiratory function stable Cardiovascular status: blood pressure returned to baseline and stable Postop Assessment: no signs of nausea or vomiting Anesthetic complications: no    Last Vitals:  Vitals:   10/19/16 1115 10/19/16 1118  BP:  (!) 162/87  Pulse: 90 91  Resp: 13 16  Temp:      Last Pain:  Vitals:   10/19/16 1118  TempSrc:   PainSc: 4                  Bruchy Mikel

## 2016-10-19 NOTE — H&P (Signed)
Melinda Krause is an 55 y.o. female.   Chief Complaint: Patient with major depression coming in for first ECT treatment. Mood is depressed and negative. HPI: Major depression recurrent severe without good response to medicine. Multiple medical problems but nothing that would interfere with ECT  Past Medical History:  Diagnosis Date  . Depression    [facility]  . Fibromyalgia    Rheumatologist- Dr. Ardis HughsShali Devashwar  . GERD (gastroesophageal reflux disease)   . Headache(784.0)    daily  . History of chicken pox    childhood  . Hyperlipidemia   . Hypertension   . Hypothyroidism   . Migraine    1-2 per month  . Morbid obesity with BMI of 45.0-49.9, adult (HCC)   . Neck injury    C-5-6  . Non compliance with medical treatment 04/02/2014  . Pain in joint, shoulder region 04/02/2013   left  . Peptic ulcer   . Seasonal allergies     Past Surgical History:  Procedure Laterality Date  . ABDOMINAL HYSTERECTOMY    . CESAREAN SECTION  1989  . COLONOSCOPY    . NM MYOVIEW LTD  04/18/2014   LexiScan: EF 60%; no evidence of ischemia or infarction. Normal stress test to  . RECTOCELE REPAIR  2009  . removal of vaginal mesh  11/2012  . TRANSTHORACIC ECHOCARDIOGRAM  04/18/2014   EF 55-60%. Difficult to assess for regional wall motion due to poor quality.  Normal diastolic pressures/function. No significant valvular lesions.  Marland Kitchen. UPPER GASTROINTESTINAL ENDOSCOPY    . VAGINA RECONSTRUCTION SURGERY  2011   vaginal mesh repair  . VAGINAL HYSTERECTOMY  2003    Family History  Problem Relation Age of Onset  . Emphysema Mother   . Allergies Mother   . Asthma Mother   . Cancer Mother   . Heart disease Mother   . Hypertension Mother   . Heart disease Father   . Hypertension Father   . Rheum arthritis Maternal Grandfather   . Cancer Maternal Grandmother   . Hypertension Paternal Grandmother   . Heart disease Paternal Grandmother   . Stroke Paternal  Grandmother   . Heart disease Paternal Grandfather   . Hypertension Son   . Arrhythmia Son    Social History:  reports that she has never smoked. She has never used smokeless tobacco. She reports that she does not drink alcohol or use drugs.  Allergies:  Allergies  Allergen Reactions  . Demerol     Irritability, and shortness of breath     (Not in a hospital admission)  No results found for this or any previous visit (from the past 48 hour(s)). No results found.  Review of Systems  Constitutional: Negative.   HENT: Negative.   Eyes: Negative.   Respiratory: Negative.   Cardiovascular: Negative.   Gastrointestinal: Negative.   Musculoskeletal: Negative.   Skin: Negative.   Neurological: Negative.   Psychiatric/Behavioral: Positive for depression. Negative for hallucinations, memory loss, substance abuse and suicidal ideas. The patient is nervous/anxious and has insomnia.     Blood pressure 136/75, pulse 95, temperature 97.7 F (36.5 C), temperature source Oral, resp. rate 17, height 5\' 5"  (1.651 m), weight 134.3 kg (296 lb), SpO2 98 %. Physical Exam  Nursing note and vitals reviewed. Constitutional: She appears well-developed and well-nourished.  HENT:  Head: Normocephalic and atraumatic.  Eyes: Conjunctivae are normal. Pupils are equal, round, and reactive to light.  Neck: Normal range of motion.  Cardiovascular: Regular rhythm  and normal heart sounds.   Respiratory: Effort normal.  GI: Soft.  Musculoskeletal: Normal range of motion.  Neurological: She is alert.  Skin: Skin is warm and dry.  Psychiatric: Her speech is normal. Judgment normal. She is slowed. Thought content is not paranoid. Cognition and memory are normal. She exhibits a depressed mood. She expresses no suicidal ideation.     Assessment/Plan ECT right leg unilateral treatment starting today continuing 3 times a week schedule  Mordecai RasmussenJohn Clapacs, MD 10/19/2016, 10:16 AM

## 2016-10-19 NOTE — Discharge Instructions (Signed)
1)  The drugs that you have been given will stay in your system until tomorrow so for the       next 24 hours you should not:  A. Drive an automobile  B. Make any legal decisions  C. Drink any alcoholic beverages  2)  You may resume your regular meals upon return home.  3)  A responsible adult must take you home.  Someone should stay with you for a few          hours, then be available by phone for the remainder of the treatment day.  4)  You May experience any of the following symptoms:  Headache, Nausea and a dry mouth (due to the medications you were given),  temporary memory loss and some confusion, or sore muscles (a warm bath  should help this).  If you you experience any of these symptoms let us know on                your return visit.  5)  Report any of the following: any acute discomfort, severe headache, or temperature        greater than 100.5 F.   Also report any unusual redness, swelling, drainage, or pain         at your IV site.    You may report Symptoms to:  ECT [facility]- [place] [facility]          Phone: [phone], ECT Department           or Dr. Shary Keylapac's office [phone]  6)  Your next ECT Treatment is Day Wednesday  Date October 21, 2016  We will call 2 days prior to your scheduled appointment for arrival times.  7)  Nothing to eat or drink after midnight the night before your procedure.  8)  Take Prilosec, Losartan, Metoprolol     With a sip of water the morning of your procedure.  9)  Other Instructions: Call [phone] to cancel the morning of your procedure due         to illness or emergency.  10) We will call within 72 hours to assess how you are feeling.

## 2016-10-21 ENCOUNTER — Encounter: Payer: Self-pay | Admitting: Anesthesiology

## 2016-10-21 ENCOUNTER — Ambulatory Visit
Admission: RE | Admit: 2016-10-21 | Discharge: 2016-10-21 | Disposition: A | Discharge status: Home / Self Care | Payer: BLUE CROSS/BLUE SHIELD | Source: Ambulatory Visit | Attending: Psychiatry | Admitting: Psychiatry

## 2016-10-21 ENCOUNTER — Other Ambulatory Visit: Payer: Self-pay | Admitting: Psychiatry

## 2016-10-21 DIAGNOSIS — I10 Essential (primary) hypertension: Secondary | ICD-10-CM | POA: Diagnosis not present

## 2016-10-21 DIAGNOSIS — E039 Hypothyroidism, unspecified: Secondary | ICD-10-CM | POA: Diagnosis not present

## 2016-10-21 DIAGNOSIS — Z791 Long term (current) use of non-steroidal anti-inflammatories (NSAID): Secondary | ICD-10-CM | POA: Insufficient documentation

## 2016-10-21 DIAGNOSIS — F332 Major depressive disorder, recurrent severe without psychotic features: Secondary | ICD-10-CM | POA: Diagnosis not present

## 2016-10-21 DIAGNOSIS — M797 Fibromyalgia: Secondary | ICD-10-CM | POA: Insufficient documentation

## 2016-10-21 DIAGNOSIS — F339 Major depressive disorder, recurrent, unspecified: Secondary | ICD-10-CM | POA: Diagnosis present

## 2016-10-21 DIAGNOSIS — J302 Other seasonal allergic rhinitis: Secondary | ICD-10-CM | POA: Insufficient documentation

## 2016-10-21 DIAGNOSIS — K219 Gastro-esophageal reflux disease without esophagitis: Secondary | ICD-10-CM | POA: Insufficient documentation

## 2016-10-21 DIAGNOSIS — Z6841 Body Mass Index (BMI) 40.0 and over, adult: Secondary | ICD-10-CM | POA: Diagnosis not present

## 2016-10-21 DIAGNOSIS — Z8711 Personal history of peptic ulcer disease: Secondary | ICD-10-CM | POA: Insufficient documentation

## 2016-10-21 DIAGNOSIS — Z79899 Other long term (current) drug therapy: Secondary | ICD-10-CM | POA: Diagnosis not present

## 2016-10-21 MED ORDER — SODIUM CHLORIDE 0.9 % IV SOLN
INTRAVENOUS | Status: DC | PRN
  Administered 2016-10-21: 11:00:00 via INTRAVENOUS

## 2016-10-21 MED ORDER — MIDAZOLAM HCL 2 MG/2ML IJ SOLN
INTRAMUSCULAR | Status: DC | PRN
  Administered 2016-10-21: 2 mg via INTRAVENOUS

## 2016-10-21 MED ORDER — SUCCINYLCHOLINE CHLORIDE 20 MG/ML IJ SOLN
INTRAMUSCULAR | Status: DC | PRN
  Administered 2016-10-21: 200 mg via INTRAVENOUS

## 2016-10-21 MED ORDER — KETOROLAC TROMETHAMINE 30 MG/ML IJ SOLN
30.0000 mg | Freq: Once | INTRAMUSCULAR | Status: AC
  Administered 2016-10-21: 10:00:00 via INTRAVENOUS

## 2016-10-21 MED ORDER — LABETALOL HCL 5 MG/ML IV SOLN
INTRAVENOUS | Status: DC | PRN
  Administered 2016-10-21: 10 mg via INTRAVENOUS
  Administered 2016-10-21: 20 mg via INTRAVENOUS

## 2016-10-21 MED ORDER — METHOHEXITAL SODIUM 100 MG/10ML IV SOSY
PREFILLED_SYRINGE | INTRAVENOUS | Status: DC | PRN
  Administered 2016-10-21: 140 mg via INTRAVENOUS

## 2016-10-21 MED ORDER — SODIUM CHLORIDE 0.9 % IV SOLN
500.0000 mL | Freq: Once | INTRAVENOUS | Status: AC
  Administered 2016-10-21: 500 mL via INTRAVENOUS

## 2016-10-21 MED ORDER — KETOROLAC TROMETHAMINE 30 MG/ML IJ SOLN
INTRAMUSCULAR | Status: AC
  Filled 2016-10-21: qty 1

## 2016-10-21 MED ORDER — SODIUM CHLORIDE 0.9 % IV SOLN: 500.0000 mL | Freq: Once | INTRAVENOUS | Status: DC

## 2016-10-21 MED ORDER — KETOROLAC TROMETHAMINE 30 MG/ML IJ SOLN: 30.0000 mg | Freq: Once | INTRAMUSCULAR | Status: DC

## 2016-10-21 NOTE — Transfer of Care (Signed)
Immediate Anesthesia Transfer of Care Note  Patient: Melinda Krause  Procedure(s) Performed: * No procedures listed *  Patient Location: PACU  Anesthesia Type:General  Level of Consciousness: patient cooperative and lethargic  Airway & Oxygen Therapy: Patient Spontanous Breathing and Patient connected to face mask oxygen  Post-op Assessment: Report given to RN and Post -op Vital signs reviewed and stable  Post vital signs: Reviewed and stable  Last Vitals:  Vitals:   10/21/16 0901 10/21/16 1132  BP: (!) 171/74 (!) 186/90  Pulse: 73 87  Resp: 17 18  Temp: 36.4 C 36.9 C    Last Pain:  Vitals:   10/21/16 1132  TempSrc:   PainSc: Asleep         Complications: No apparent anesthesia complications

## 2016-10-21 NOTE — Anesthesia Postprocedure Evaluation (Signed)
Anesthesia Post Note  Patient: Melinda Krause  Procedure(s) Performed: * No procedures listed *  Patient location during evaluation: PACU Anesthesia Type: General Level of consciousness: awake and alert and oriented Pain management: pain level controlled Vital Signs Assessment: post-procedure vital signs reviewed and stable Respiratory status: spontaneous breathing, nonlabored ventilation and respiratory function stable Cardiovascular status: blood pressure returned to baseline and stable Postop Assessment: no signs of nausea or vomiting Anesthetic complications: no    Last Vitals:  Vitals:   10/21/16 1152 10/21/16 1200  BP: 139/78 (!) 162/71  Pulse: 87 81  Resp: (!) 25 (!) 21  Temp:      Last Pain:  Vitals:   10/21/16 1200  TempSrc:   PainSc: 0-No pain                 Mcihael Hinderman

## 2016-10-21 NOTE — Procedures (Signed)
ECT SERVICES Physician's Interval Evaluation & Treatment Note  Patient Identification: Melinda Krause MRN:  [id number] Date of Evaluation:  10/21/2016 TX #: 2  MADRS:   MMSE:   P.E. Findings:  No change to physical exam vitals unremarkable. Lungs clear heart normal.  Psychiatric Interval Note:  Mood still depressed no suicidal ideation no psychosis  Subjective:  Patient is a 55 y.o. female seen for evaluation for Electroconvulsive Therapy. Soreness and some unpleasant uneasy feelings waking up from last treatment  Treatment Summary:   [x]   Right Unilateral             []  Bilateral   % Energy : 0.3 ms 75%   Impedance: 1120 ohms  Seizure Energy Index: 6427 V squared  Postictal Suppression Index: 86%  Seizure Concordance Index: 96%  Medications  Pre Shock: Toradol 30 mg, labetalol 20 mg Brevital 140 mg succinylcholine 200 mg  Post Shock: Versed 2 mg  Seizure Duration: 6 seconds by EMG, 35 seconds by EEG   Comments: Treatment went well. Anticipate keeping the same medicine doses next time. Follow-up Friday.   Lungs:  [x]   Clear to auscultation               []  Other:   Heart:    [x]   Regular rhythm             []  irregular rhythm    [x]   Previous H&P reviewed, patient examined and there are NO CHANGES                 []   Previous H&P reviewed, patient examined and there are changes noted.   Mordecai RasmussenJohn Vastie Douty, MD 11/29/201711:08 AM

## 2016-10-21 NOTE — Discharge Instructions (Signed)
1)  The drugs that you have been given will stay in your system until tomorrow so for the       next 24 hours you should not:  A. Drive an automobile  B. Make any legal decisions  C. Drink any alcoholic beverages  2)  You may resume your regular meals upon return home.  3)  A responsible adult must take you home.  Someone should stay with you for a few          hours, then be available by phone for the remainder of the treatment day.  4)  You May experience any of the following symptoms:  Headache, Nausea and a dry mouth (due to the medications you were given),  temporary memory loss and some confusion, or sore muscles (a warm bath  should help this).  If you you experience any of these symptoms let us know on                your return visit.  5)  Report any of the following: any acute discomfort, severe headache, or temperature        greater than 100.5 F.   Also report any unusual redness, swelling, drainage, or pain         at your IV site.    You may report Symptoms to:  ECT PROGRAM- [place] [facility]          Phone: [phone], ECT Department           or Dr. Shary Keylapac's office [phone]  6)  Your next ECT Treatment is Friday October 24, 2016 at 9:00  We will call 2 days prior to your scheduled appointment for arrival times.  7)  Nothing to eat or drink after midnight the night before your procedure.  8)  Take Amlodipine and HTCZ wtih a sip of water the morning of your procedure.  9)  Other Instructions: Call [phone] to cancel the morning of your procedure due         to illness or emergency.  10) We will call within 72 hours to assess how you are feeling.

## 2016-10-21 NOTE — H&P (Signed)
Melinda Krause is an 55 y.o. female.   Chief Complaint: Had some soreness and unpleasant recovery after last treatment. Mood still depressed. No other new complaint. HPI: History of recurrent severe depression being treated with ECT just starting current course  Past Medical History:  Diagnosis Date  . Depression    [facility]  . Fibromyalgia    Rheumatologist- Dr. Ardis HughsShali Devashwar  . GERD (gastroesophageal reflux disease)   . Headache(784.0)    daily  . History of chicken pox    childhood  . Hyperlipidemia   . Hypertension   . Hypothyroidism   . Migraine    1-2 per month  . Morbid obesity with BMI of 45.0-49.9, adult (HCC)   . Neck injury    C-5-6  . Non compliance with medical treatment 04/02/2014  . Pain in joint, shoulder region 04/02/2013   left  . Peptic ulcer   . Seasonal allergies     Past Surgical History:  Procedure Laterality Date  . ABDOMINAL HYSTERECTOMY    . CESAREAN SECTION  1989  . COLONOSCOPY    . NM MYOVIEW LTD  04/18/2014   LexiScan: EF 60%; no evidence of ischemia or infarction. Normal stress test to  . RECTOCELE REPAIR  2009  . removal of vaginal mesh  11/2012  . TRANSTHORACIC ECHOCARDIOGRAM  04/18/2014   EF 55-60%. Difficult to assess for regional wall motion due to poor quality.  Normal diastolic pressures/function. No significant valvular lesions.  Marland Kitchen. UPPER GASTROINTESTINAL ENDOSCOPY    . VAGINA RECONSTRUCTION SURGERY  2011   vaginal mesh repair  . VAGINAL HYSTERECTOMY  2003    Family History  Problem Relation Age of Onset  . Emphysema Mother   . Allergies Mother   . Asthma Mother   . Cancer Mother   . Heart disease Mother   . Hypertension Mother   . Heart disease Father   . Hypertension Father   . Rheum arthritis Maternal Grandfather   . Cancer Maternal Grandmother   . Hypertension Paternal Grandmother   . Heart disease Paternal Grandmother   . Stroke Paternal Grandmother   . Heart disease  Paternal Grandfather   . Hypertension Son   . Arrhythmia Son    Social History:  reports that she has never smoked. She has never used smokeless tobacco. She reports that she does not drink alcohol or use drugs.  Allergies:  Allergies  Allergen Reactions  . Demerol     Irritability, and shortness of breath     (Not in a hospital admission)  No results found for this or any previous visit (from the past 48 hour(s)). No results found.  Review of Systems  Constitutional: Negative.   HENT: Negative.   Eyes: Negative.   Respiratory: Negative.   Cardiovascular: Negative.   Gastrointestinal: Negative.   Musculoskeletal: Positive for myalgias.  Skin: Negative.   Neurological: Negative.   Psychiatric/Behavioral: Positive for depression. Negative for hallucinations, memory loss, substance abuse and suicidal ideas. The patient is nervous/anxious and has insomnia.     Blood pressure (!) 171/74, pulse 73, temperature 97.6 F (36.4 C), temperature source Oral, resp. rate 17, height 5\' 5"  (1.651 m), weight 135.2 kg (298 lb), SpO2 96 %. Physical Exam  Nursing note and vitals reviewed. Constitutional: She appears well-developed and well-nourished.  HENT:  Head: Normocephalic and atraumatic.  Eyes: Conjunctivae are normal. Pupils are equal, round, and reactive to light.  Neck: Normal range of motion.  Cardiovascular: Regular rhythm and normal heart  sounds.   Respiratory: Effort normal.  GI: Soft.  Musculoskeletal: Normal range of motion.  Neurological: She is alert.  Skin: Skin is warm and dry.  Psychiatric: Judgment normal. Her speech is delayed. She is slowed. Cognition and memory are normal. She exhibits a depressed mood. She expresses no suicidal ideation.     Assessment/Plan Treatment today we have added Toradol and Versed. Follow-up 3 times a week treatment.  Mordecai RasmussenJohn Yeimy Brabant, MD 10/21/2016, 11:07 AM

## 2016-10-21 NOTE — Anesthesia Preprocedure Evaluation (Signed)
Anesthesia Evaluation  Patient identified by MRN, date of birth, ID band Patient awake    Reviewed: Allergy & Precautions, NPO status , Patient's Chart, lab work & pertinent test results  History of Anesthesia Complications Negative for: history of anesthetic complications  Airway Mallampati: II  TM Distance: >3 FB Neck ROM: Full    Dental  (+) Poor Dentition, Missing   Pulmonary sleep apnea , neg COPD,    breath sounds clear to auscultation- rhonchi (-) wheezing      Cardiovascular Exercise Tolerance: Good hypertension, Pt. on medications (-) CAD, (-) Past MI and (-) Cardiac Stents  Rhythm:Regular Rate:Normal     Neuro/Psych  Headaches, Depression    GI/Hepatic Neg liver ROS, PUD, GERD  ,  Endo/Other  neg diabetesHypothyroidism   Renal/GU negative Renal ROS     Musculoskeletal  (+) Fibromyalgia -  Abdominal (+) + obese,   Peds  Hematology negative hematology ROS (+)   Anesthesia Other Findings Past Medical History: No date: Depression     Comment: Counseling-Dr [facility] No date: Fibromyalgia     Comment: Rheumatologist- Dr. Ardis HughsShali Devashwar No date: GERD (gastroesophageal reflux disease) No date: Headache(784.0)     Comment: daily No date: History of chicken pox     Comment: childhood No date: Hyperlipidemia No date: Hypertension No date: Hypothyroidism No date: Migraine     Comment: 1-2 per month No date: Morbid obesity with BMI of 45.0-49.9, adult (H* No date: Neck injury     Comment: C-5-6 04/02/2014: Non compliance with medical treatment 04/02/2013: Pain in joint, shoulder region     Comment: left No date: Peptic ulcer No date: Seasonal allergies   Reproductive/Obstetrics                             Anesthesia Physical  Anesthesia Plan  ASA: III  Anesthesia Plan: General   Post-op Pain Management:    Induction: Intravenous  Airway  Management Planned: Mask  Additional Equipment:   Intra-op Plan:   Post-operative Plan:   Informed Consent: I have reviewed the patients History and Physical, chart, labs and discussed the procedure including the risks, benefits and alternatives for the proposed anesthesia with the patient or authorized representative who has indicated his/her understanding and acceptance.   Dental advisory given  Plan Discussed with: Anesthesiologist and CRNA  Anesthesia Plan Comments:         Anesthesia Quick Evaluation

## 2016-10-22 ENCOUNTER — Other Ambulatory Visit: Payer: Self-pay | Admitting: Psychiatry

## 2016-10-23 ENCOUNTER — Encounter
Admission: RE | Admit: 2016-10-23 | Discharge: 2016-10-23 | Disposition: A | Discharge status: Home / Self Care | Payer: BLUE CROSS/BLUE SHIELD | Source: Ambulatory Visit | Attending: Psychiatry | Admitting: Psychiatry

## 2016-10-23 ENCOUNTER — Encounter: Payer: Self-pay | Admitting: Anesthesiology

## 2016-10-23 DIAGNOSIS — M797 Fibromyalgia: Secondary | ICD-10-CM | POA: Insufficient documentation

## 2016-10-23 DIAGNOSIS — Z809 Family history of malignant neoplasm, unspecified: Secondary | ICD-10-CM | POA: Diagnosis not present

## 2016-10-23 DIAGNOSIS — Z8249 Family history of ischemic heart disease and other diseases of the circulatory system: Secondary | ICD-10-CM | POA: Diagnosis not present

## 2016-10-23 DIAGNOSIS — E785 Hyperlipidemia, unspecified: Secondary | ICD-10-CM | POA: Insufficient documentation

## 2016-10-23 DIAGNOSIS — Z6841 Body Mass Index (BMI) 40.0 and over, adult: Secondary | ICD-10-CM | POA: Diagnosis not present

## 2016-10-23 DIAGNOSIS — Z8711 Personal history of peptic ulcer disease: Secondary | ICD-10-CM | POA: Insufficient documentation

## 2016-10-23 DIAGNOSIS — Z9889 Other specified postprocedural states: Secondary | ICD-10-CM | POA: Diagnosis not present

## 2016-10-23 DIAGNOSIS — F332 Major depressive disorder, recurrent severe without psychotic features: Secondary | ICD-10-CM

## 2016-10-23 DIAGNOSIS — E039 Hypothyroidism, unspecified: Secondary | ICD-10-CM | POA: Insufficient documentation

## 2016-10-23 DIAGNOSIS — F338 Other recurrent depressive disorders: Secondary | ICD-10-CM | POA: Insufficient documentation

## 2016-10-23 DIAGNOSIS — I10 Essential (primary) hypertension: Secondary | ICD-10-CM | POA: Insufficient documentation

## 2016-10-23 DIAGNOSIS — Z823 Family history of stroke: Secondary | ICD-10-CM | POA: Insufficient documentation

## 2016-10-23 DIAGNOSIS — K219 Gastro-esophageal reflux disease without esophagitis: Secondary | ICD-10-CM | POA: Diagnosis not present

## 2016-10-23 DIAGNOSIS — Z888 Allergy status to other drugs, medicaments and biological substances status: Secondary | ICD-10-CM | POA: Diagnosis not present

## 2016-10-23 DIAGNOSIS — Z825 Family history of asthma and other chronic lower respiratory diseases: Secondary | ICD-10-CM | POA: Diagnosis not present

## 2016-10-23 MED ORDER — MIDAZOLAM HCL 2 MG/2ML IJ SOLN
INTRAMUSCULAR | Status: DC | PRN
  Administered 2016-10-23: 2 mg via INTRAVENOUS

## 2016-10-23 MED ORDER — LABETALOL HCL 5 MG/ML IV SOLN
INTRAVENOUS | Status: DC | PRN
  Administered 2016-10-23: 20 mg via INTRAVENOUS

## 2016-10-23 MED ORDER — SODIUM CHLORIDE 0.9 % IV SOLN
INTRAVENOUS | Status: DC | PRN
  Administered 2016-10-23: 11:00:00 via INTRAVENOUS

## 2016-10-23 MED ORDER — KETOROLAC TROMETHAMINE 30 MG/ML IJ SOLN
INTRAMUSCULAR | Status: AC
  Administered 2016-10-23: 30 mg via INTRAVENOUS
  Filled 2016-10-23: qty 1

## 2016-10-23 MED ORDER — SUCCINYLCHOLINE CHLORIDE 200 MG/10ML IV SOSY
PREFILLED_SYRINGE | INTRAVENOUS | Status: DC | PRN
  Administered 2016-10-23: 200 mg via INTRAVENOUS

## 2016-10-23 MED ORDER — SODIUM CHLORIDE 0.9 % IV SOLN
500.0000 mL | Freq: Once | INTRAVENOUS | Status: AC
  Administered 2016-10-23: 500 mL via INTRAVENOUS

## 2016-10-23 MED ORDER — ONDANSETRON HCL 4 MG/2ML IJ SOLN
4.0000 mg | Freq: Once | INTRAMUSCULAR | Status: AC
  Administered 2016-10-23: 4 mg via INTRAVENOUS

## 2016-10-23 MED ORDER — KETOROLAC TROMETHAMINE 30 MG/ML IJ SOLN
30.0000 mg | Freq: Once | INTRAMUSCULAR | Status: AC
  Administered 2016-10-23: 30 mg via INTRAVENOUS

## 2016-10-23 MED ORDER — METHOHEXITAL SODIUM 100 MG/10ML IV SOSY
PREFILLED_SYRINGE | INTRAVENOUS | Status: DC | PRN
  Administered 2016-10-23: 140 mg via INTRAVENOUS

## 2016-10-23 MED ORDER — ONDANSETRON HCL 4 MG/2ML IJ SOLN
INTRAMUSCULAR | Status: AC
  Administered 2016-10-23: 4 mg via INTRAVENOUS
  Filled 2016-10-23: qty 2

## 2016-10-23 NOTE — Procedures (Signed)
ECT SERVICES Physician's Interval Evaluation & Treatment Note  Patient Identification: Melinda Krause MRN:  [id number] Date of Evaluation:  10/23/2016 TX #: 3  MADRS:   MMSE:   P.E. Findings:  No change to physical exam. Vitals unremarkable. Lungs clear heart normal  Psychiatric Interval Note:  Patient is feeling slightly more energetic and talkative. To examination she seems more alert with a more full and relaxed affect  Subjective:  Patient is a 55 y.o. female seen for evaluation for Electroconvulsive Therapy. No specific new complaint. Feels like she had a better recovery than she did after her first treatment  Treatment Summary:   [x]   Right Unilateral             []  Bilateral   % Energy : 0.3 ms 75%   Impedance: 790 ohms  Seizure Energy Index: 5089 V squared  Postictal Suppression Index: 72%  Seizure Concordance Index: 90%  Medications  Pre Shock: Toradol 30 mg labetalol 20 mg Brevital 140 mg succinylcholine 200 mg  Post Shock: Versed 2 mg  Seizure Duration: 6 seconds by EMG, 46 seconds by EEG   Comments: No complications. Patient will return next week and he will be on the schedule Monday Wednesday and Friday   Lungs:  [x]   Clear to auscultation               []  Other:   Heart:    [x]   Regular rhythm             []  irregular rhythm    [x]   Previous H&P reviewed, patient examined and there are NO CHANGES                 []   Previous H&P reviewed, patient examined and there are changes noted.   Mordecai RasmussenJohn Montrel Donahoe, MD 12/1/201710:40 AM

## 2016-10-23 NOTE — H&P (Signed)
Melinda Krause is an 55 y.o. female.   Chief Complaint: Patient is feeling a little bit more energetic and talkative. Still subjectively depressed. 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 Depression    [facility]  . Fibromyalgia    Rheumatologist- Dr. Ardis HughsShali Devashwar  . GERD (gastroesophageal reflux disease)   . Headache(784.0)    daily  . History of chicken pox    childhood  . Hyperlipidemia   . Hypertension   . Hypothyroidism   . Migraine    1-2 per month  . Morbid obesity with BMI of 45.0-49.9, adult (HCC)   . Neck injury    C-5-6  . Non compliance with medical treatment 04/02/2014  . Pain in joint, shoulder region 04/02/2013   left  . Peptic ulcer   . Seasonal allergies     Past Surgical History:  Procedure Laterality Date  . ABDOMINAL HYSTERECTOMY    . CESAREAN SECTION  1989  . COLONOSCOPY    . NM MYOVIEW LTD  04/18/2014   LexiScan: EF 60%; no evidence of ischemia or infarction. Normal stress test to  . RECTOCELE REPAIR  2009  . removal of vaginal mesh  11/2012  . TRANSTHORACIC ECHOCARDIOGRAM  04/18/2014   EF 55-60%. Difficult to assess for regional wall motion due to poor quality.  Normal diastolic pressures/function. No significant valvular lesions.  Marland Kitchen. UPPER GASTROINTESTINAL ENDOSCOPY    . VAGINA RECONSTRUCTION SURGERY  2011   vaginal mesh repair  . VAGINAL HYSTERECTOMY  2003    Family History  Problem Relation Age of Onset  . Emphysema Mother   . Allergies Mother   . Asthma Mother   . Cancer Mother   . Heart disease Mother   . Hypertension Mother   . Heart disease Father   . Hypertension Father   . Rheum arthritis Maternal Grandfather   . Cancer Maternal Grandmother   . Hypertension Paternal Grandmother   . Heart disease Paternal Grandmother   .  Stroke Paternal Grandmother   . Heart disease Paternal Grandfather   . Hypertension Son   . Arrhythmia Son    Social History:  reports that she has never smoked. She has never used smokeless tobacco. She reports that she does not drink alcohol or use drugs.  Allergies:  Allergies  Allergen Reactions  . Demerol     Irritability, and shortness of breath     (Not in a hospital admission)  No results found for this or any previous visit (from the past 48 hour(s)). No results found.  Review of Systems  Constitutional: Negative.   HENT: Negative.   Eyes: Negative.   Respiratory: Negative.   Cardiovascular: Negative.   Gastrointestinal: Negative.   Musculoskeletal: Positive for myalgias and neck pain.  Skin: Negative.   Neurological: Negative.   Psychiatric/Behavioral: Positive for depression and memory loss. Negative for hallucinations, substance abuse and suicidal ideas. The patient is not nervous/anxious and does not have insomnia.     Blood pressure (!) 185/84, pulse 78, temperature 97.6 F (36.4 C), temperature source Oral, resp. rate 18, height 5\' 5"  (1.651 m), weight 135.6 kg (299 lb), SpO2 98 %. Physical Exam  Nursing note and vitals reviewed. Constitutional: She appears well-developed and well-nourished.  HENT:  Head: Normocephalic and atraumatic.  Eyes: Conjunctivae are normal. Pupils are equal, round, and  reactive to light.  Neck: Normal range of motion.  Cardiovascular: Regular rhythm and normal heart sounds.   Respiratory: Effort normal. No respiratory distress.  GI: Soft.  Musculoskeletal: Normal range of motion.  Neurological: She is alert.  Skin: Skin is warm and dry.  Psychiatric: Judgment normal. Her affect is blunt. Her speech is delayed. She is slowed. Cognition and memory are normal. She expresses no suicidal ideation.     Assessment/Plan Treatment today followed by continued 3 times a week schedule with continued monitoring of improvement  Mordecai RasmussenJohn  Clapacs, MD 10/23/2016, 10:37 AM

## 2016-10-23 NOTE — Anesthesia Procedure Notes (Signed)
Performed by: Chuckie Mccathern Pre-anesthesia Checklist: Patient identified, Emergency Drugs available, Suction available and Patient being monitored Patient Re-evaluated:Patient Re-evaluated prior to inductionOxygen Delivery Method: Circle system utilized Preoxygenation: Pre-oxygenation with 100% oxygen Intubation Type: IV induction Ventilation: Mask ventilation without difficulty and Mask ventilation throughout procedure Airway Equipment and Method: Bite block Placement Confirmation: positive ETCO2 Dental Injury: Teeth and Oropharynx as per pre-operative assessment        

## 2016-10-23 NOTE — Anesthesia Postprocedure Evaluation (Signed)
Anesthesia Post Note  Patient: Melinda Krause  Procedure(s) Performed: * No procedures listed *  Patient location during evaluation: PACU Anesthesia Type: General Level of consciousness: awake and alert Pain management: pain level controlled Vital Signs Assessment: post-procedure vital signs reviewed and stable Respiratory status: spontaneous breathing, nonlabored ventilation and respiratory function stable Cardiovascular status: blood pressure returned to baseline and stable Postop Assessment: no signs of nausea or vomiting Anesthetic complications: no    Last Vitals:  Vitals:   10/23/16 1131 10/23/16 1144  BP: (!) 148/97   Pulse: 80 81  Resp: 15 18  Temp: 36.7 C 36.8 C    Last Pain:  Vitals:   10/23/16 1144  TempSrc: Oral  PainSc:                  [name]

## 2016-10-23 NOTE — Anesthesia Preprocedure Evaluation (Signed)
Anesthesia Evaluation  Patient identified by MRN, date of birth, ID band Patient awake    Reviewed: Allergy & Precautions, NPO status , Patient's Chart, lab work & pertinent test results  History of Anesthesia Complications Negative for: history of anesthetic complications  Airway Mallampati: II  TM Distance: >3 FB Neck ROM: Full    Dental  (+) Poor Dentition, Missing   Pulmonary sleep apnea , neg COPD,    breath sounds clear to auscultation- rhonchi (-) wheezing      Cardiovascular Exercise Tolerance: Good hypertension, Pt. on medications (-) CAD, (-) Past MI and (-) Cardiac Stents  Rhythm:Regular Rate:Normal     Neuro/Psych  Headaches, Depression    GI/Hepatic Neg liver ROS, PUD, GERD  ,  Endo/Other  neg diabetesHypothyroidism   Renal/GU negative Renal ROS     Musculoskeletal  (+) Fibromyalgia -  Abdominal (+) + obese,   Peds  Hematology negative hematology ROS (+)   Anesthesia Other Findings Past Medical History: No date: Depression     Comment: Counseling-Dr Pam [facility] No date: Fibromyalgia     Comment: Rheumatologist- Dr. Shali Devashwar No date: GERD (gastroesophageal reflux disease) No date: Headache(784.0)     Comment: daily No date: History of chicken pox     Comment: childhood No date: Hyperlipidemia No date: Hypertension No date: Hypothyroidism No date: Migraine     Comment: 1-2 per month No date: Morbid obesity with BMI of 45.0-49.9, adult (H* No date: Neck injury     Comment: C-5-6 04/02/2014: Non compliance with medical treatment 04/02/2013: Pain in joint, shoulder region     Comment: left No date: Peptic ulcer No date: Seasonal allergies   Reproductive/Obstetrics                             Anesthesia Physical  Anesthesia Plan  ASA: III  Anesthesia Plan: General   Post-op Pain Management:    Induction: Intravenous  Airway  Management Planned: Mask  Additional Equipment:   Intra-op Plan:   Post-operative Plan:   Informed Consent: I have reviewed the patients History and Physical, chart, labs and discussed the procedure including the risks, benefits and alternatives for the proposed anesthesia with the patient or authorized representative who has indicated his/her understanding and acceptance.   Dental advisory given  Plan Discussed with: Anesthesiologist and CRNA  Anesthesia Plan Comments:         Anesthesia Quick Evaluation  

## 2016-10-23 NOTE — Discharge Instructions (Signed)
1)  The drugs that you have been given will stay in your system until tomorrow so for the       next 24 hours you should not:  A. Drive an automobile  B. Make any legal decisions  C. Drink any alcoholic beverages  2)  You may resume your regular meals upon return home.  3)  A responsible adult must take you home.  Someone should stay with you for a few          hours, then be available by phone for the remainder of the treatment day.  4)  You May experience any of the following symptoms:  Headache, Nausea and a dry mouth (due to the medications you were given),  temporary memory loss and some confusion, or sore muscles (a warm bath  should help this).  If you you experience any of these symptoms let us know on                your return visit.  5)  Report any of the following: any acute discomfort, severe headache, or temperature        greater than 100.5 F.   Also report any unusual redness, swelling, drainage, or pain         at your IV site.    You may report Symptoms to:  ECT PRO[facility]- [place] [facility]          Phone: [phone], ECT Department           or Dr. Shary Keylapac's office [phone]  6)  Your next ECT Treatment is Day Monday Date October 26, 2016  We will call 2 days prior to your scheduled appointment for arrival times.  7)  Nothing to eat or drink after midnight the night before your procedure.  8)  Take Metoprolol, Cozaar, Clonidine, Prilosec, Levothyroxine     With a sip of water the morning of your procedure.  9)  Other Instructions: Call [phone] to cancel the morning of your procedure due         to illness or emergency.  10) We will call within 72 hours to assess how you are feeling.

## 2016-10-23 NOTE — Transfer of Care (Signed)
Immediate Anesthesia Transfer of Care Note  Patient: Melinda Krause  Procedure(s) Performed: ECT  Patient Location: PACU  Anesthesia Type:General  Level of Consciousness: sedated  Airway & Oxygen Therapy: Patient Spontanous Breathing and Patient connected to face mask oxygen  Post-op Assessment: Report given to RN and Post -op Vital signs reviewed and stable  Post vital signs: Reviewed and stable  Last Vitals:  Vitals:   10/23/16 0900  BP: (!) 185/84  Pulse: 78  Resp: 18  Temp: 36.4 C    Last Pain:  Vitals:   10/23/16 0933  TempSrc:   PainSc: 0-No pain      Patients Stated Pain Goal: 0 (10/23/16 0933)  Complications: No apparent anesthesia complications

## 2016-10-25 ENCOUNTER — Other Ambulatory Visit: Payer: Self-pay | Admitting: Psychiatry

## 2016-10-26 ENCOUNTER — Encounter: Payer: Self-pay | Admitting: Anesthesiology

## 2016-10-26 ENCOUNTER — Inpatient Hospital Stay: Admission: RE | Admit: 2016-10-26 | Payer: Self-pay | Source: Ambulatory Visit

## 2016-10-26 NOTE — Progress Notes (Signed)
Patient's husband called back and said the patient was sick with muscle aches and increased blood pressure and headache.  They are trying to get an appointment with her PCP today.  Appointment time given for Wednesday and instructed to call ECT office if unable to come.  Dr. Toni Amendlapacs informed.

## 2016-10-27 ENCOUNTER — Other Ambulatory Visit: Payer: Self-pay | Admitting: Psychiatry

## 2016-10-28 ENCOUNTER — Encounter: Payer: Self-pay | Admitting: Anesthesiology

## 2016-10-28 ENCOUNTER — Encounter (HOSPITAL_BASED_OUTPATIENT_CLINIC_OR_DEPARTMENT_OTHER)
Admission: RE | Admit: 2016-10-28 | Discharge: 2016-10-28 | Disposition: A | Discharge status: Home / Self Care | Payer: BLUE CROSS/BLUE SHIELD | Source: Ambulatory Visit | Attending: Psychiatry | Admitting: Psychiatry

## 2016-10-28 DIAGNOSIS — F338 Other recurrent depressive disorders: Secondary | ICD-10-CM | POA: Diagnosis not present

## 2016-10-28 DIAGNOSIS — F332 Major depressive disorder, recurrent severe without psychotic features: Secondary | ICD-10-CM | POA: Diagnosis not present

## 2016-10-28 MED ORDER — LABETALOL HCL 5 MG/ML IV SOLN
INTRAVENOUS | Status: DC | PRN
  Administered 2016-10-28: 20 mg via INTRAVENOUS

## 2016-10-28 MED ORDER — SUCCINYLCHOLINE CHLORIDE 200 MG/10ML IV SOSY
PREFILLED_SYRINGE | INTRAVENOUS | Status: DC | PRN
  Administered 2016-10-28: 200 mg via INTRAVENOUS

## 2016-10-28 MED ORDER — MIDAZOLAM HCL 2 MG/2ML IJ SOLN
INTRAMUSCULAR | Status: DC | PRN
  Administered 2016-10-28: 2 mg via INTRAVENOUS

## 2016-10-28 MED ORDER — METHOHEXITAL SODIUM 100 MG/10ML IV SOSY
PREFILLED_SYRINGE | INTRAVENOUS | Status: DC | PRN
  Administered 2016-10-28: 140 mg via INTRAVENOUS

## 2016-10-28 MED ORDER — KETOROLAC TROMETHAMINE 30 MG/ML IJ SOLN
INTRAMUSCULAR | Status: AC
  Administered 2016-10-28: 30 mg via INTRAVENOUS
  Filled 2016-10-28: qty 1

## 2016-10-28 MED ORDER — KETOROLAC TROMETHAMINE 30 MG/ML IJ SOLN
30.0000 mg | Freq: Once | INTRAMUSCULAR | Status: AC
  Administered 2016-10-28: 30 mg via INTRAVENOUS

## 2016-10-28 MED ORDER — SODIUM CHLORIDE 0.9 % IV SOLN
500.0000 mL | Freq: Once | INTRAVENOUS | Status: AC
  Administered 2016-10-28: 1000 mL via INTRAVENOUS

## 2016-10-28 MED ORDER — SODIUM CHLORIDE 0.9 % IV SOLN
INTRAVENOUS | Status: DC | PRN
  Administered 2016-10-28: 10:00:00 via INTRAVENOUS

## 2016-10-28 NOTE — Anesthesia Postprocedure Evaluation (Signed)
Anesthesia Post Note  Patient: Melinda Krause  Procedure(s) Performed: * No procedures listed *  Patient location during evaluation: PACU Anesthesia Type: General Level of consciousness: awake and alert Pain management: pain level controlled Vital Signs Assessment: post-procedure vital signs reviewed and stable Respiratory status: spontaneous breathing, nonlabored ventilation and respiratory function stable Cardiovascular status: blood pressure returned to baseline and stable Postop Assessment: no signs of nausea or vomiting Anesthetic complications: no    Last Vitals:  Vitals:   10/28/16 1100 10/28/16 1112  BP: (!) 157/89 (!) 145/75  Pulse: 78 75  Resp: 19 18  Temp: 36.6 C 36.4 C    Last Pain:  Vitals:   10/28/16 1112  TempSrc: Oral  PainSc:                  Lenard SimmerAndrew Cirilo Canner

## 2016-10-28 NOTE — H&P (Signed)
Melinda Krause is an 55 y.o. female.   Chief Complaint: She's been feeling a little bit more sick which is why she missed treatment on Monday. More headaches. Blood pressure is running higher. Mood not dramatically different but possibly some improvement HPI: History of recurrent severe depression currently receiving outpatient ECT treatment with today being treatment #4  Past Medical History:  Diagnosis Date  . Depression    [facility]  . Fibromyalgia    Rheumatologist- Dr. Ardis HughsShali Devashwar  . GERD (gastroesophageal reflux disease)   . Headache(784.0)    daily  . History of chicken pox    childhood  . Hyperlipidemia   . Hypertension   . Hypothyroidism   . Migraine    1-2 per month  . Morbid obesity with BMI of 45.0-49.9, adult (HCC)   . Neck injury    C-5-6  . Non compliance with medical treatment 04/02/2014  . Pain in joint, shoulder region 04/02/2013   left  . Peptic ulcer   . Seasonal allergies     Past Surgical History:  Procedure Laterality Date  . ABDOMINAL HYSTERECTOMY    . CESAREAN SECTION  1989  . COLONOSCOPY    . NM MYOVIEW LTD  04/18/2014   LexiScan: EF 60%; no evidence of ischemia or infarction. Normal stress test to  . RECTOCELE REPAIR  2009  . removal of vaginal mesh  11/2012  . TRANSTHORACIC ECHOCARDIOGRAM  04/18/2014   EF 55-60%. Difficult to assess for regional wall motion due to poor quality.  Normal diastolic pressures/function. No significant valvular lesions.  Marland Kitchen. UPPER GASTROINTESTINAL ENDOSCOPY    . VAGINA RECONSTRUCTION SURGERY  2011   vaginal mesh repair  . VAGINAL HYSTERECTOMY  2003    Family History  Problem Relation Age of Onset  . Emphysema Mother   . Allergies Mother   . Asthma Mother   . Cancer Mother   . Heart disease Mother   . Hypertension Mother   . Heart disease Father   . Hypertension Father   . Rheum arthritis Maternal Grandfather   . Cancer Maternal Grandmother   . Hypertension Paternal  Grandmother   . Heart disease Paternal Grandmother   . Stroke Paternal Grandmother   . Heart disease Paternal Grandfather   . Hypertension Son   . Arrhythmia Son    Social History:  reports that she has never smoked. She has never used smokeless tobacco. She reports that she does not drink alcohol or use drugs.  Allergies:  Allergies  Allergen Reactions  . Demerol     Irritability, and shortness of breath     (Not in a hospital admission)  No results found for this or any previous visit (from the past 48 hour(s)). No results found.  Review of Systems  Constitutional: Negative.   HENT: Negative.   Eyes: Negative.   Respiratory: Negative.   Cardiovascular: Negative.   Gastrointestinal: Positive for nausea.  Musculoskeletal: Negative.   Skin: Negative.   Neurological: Positive for headaches.  Psychiatric/Behavioral: Positive for depression. Negative for hallucinations, memory loss, substance abuse and suicidal ideas. The patient is nervous/anxious and has insomnia.     Blood pressure (!) 176/74, pulse 75, temperature 98.1 F (36.7 C), temperature source Oral, resp. rate 18, height 5\' 5"  (1.651 m), weight 133.8 kg (295 lb), SpO2 97 %. Physical Exam  Nursing note and vitals reviewed. Constitutional: She appears well-developed and well-nourished.  HENT:  Head: Normocephalic and atraumatic.  Eyes: Conjunctivae are normal. Pupils are equal,  round, and reactive to light.  Neck: Normal range of motion.  Cardiovascular: Regular rhythm and normal heart sounds.   Respiratory: No respiratory distress.  GI: Soft.  Musculoskeletal: Normal range of motion.  Neurological: She is alert.  Skin: Skin is warm and dry.  Psychiatric: Her speech is normal. Judgment normal. She is slowed. Cognition and memory are normal. She exhibits a depressed mood. She expresses no suicidal ideation.     Assessment/Plan Follow-up after the weekend. On per seen circumstances I will be out on Friday.  Otherwise continue 3 times a week treatment.  Mordecai RasmussenJohn Rainee Sweatt, MD 10/28/2016, 10:10 AM

## 2016-10-28 NOTE — Anesthesia Preprocedure Evaluation (Signed)
Anesthesia Evaluation  Patient identified by MRN, date of birth, ID band Patient awake    Reviewed: Allergy & Precautions, NPO status , Patient's Chart, lab work & pertinent test results  History of Anesthesia Complications Negative for: history of anesthetic complications  Airway Mallampati: II  TM Distance: >3 FB Neck ROM: Full    Dental  (+) Poor Dentition, Missing   Pulmonary sleep apnea , neg COPD,    breath sounds clear to auscultation- rhonchi (-) wheezing      Cardiovascular Exercise Tolerance: Good hypertension, Pt. on medications (-) CAD, (-) Past MI and (-) Cardiac Stents  Rhythm:Regular Rate:Normal     Neuro/Psych  Headaches,    GI/Hepatic Neg liver ROS, PUD, GERD  ,  Endo/Other  neg diabetesHypothyroidism   Renal/GU negative Renal ROS     Musculoskeletal  (+) Fibromyalgia -  Abdominal (+) + obese,   Peds  Hematology negative hematology ROS (+)   Anesthesia Other Findings Past Medical History: No date: Depression     Comment: Counseling-Dr [facility] No date: Fibromyalgia     Comment: Rheumatologist- Dr. Ardis HughsShali Devashwar No date: GERD (gastroesophageal reflux disease) No date: Headache(784.0)     Comment: daily No date: History of chicken pox     Comment: childhood No date: Hyperlipidemia No date: Hypertension No date: Hypothyroidism No date: Migraine     Comment: 1-2 per month No date: Morbid obesity with BMI of 45.0-49.9, adult (H* No date: Neck injury     Comment: C-5-6 04/02/2014: Non compliance with medical treatment 04/02/2013: Pain in joint, shoulder region     Comment: left No date: Peptic ulcer No date: Seasonal allergies   Reproductive/Obstetrics                             Anesthesia Physical  Anesthesia Plan  ASA: III  Anesthesia Plan: General   Post-op Pain Management:    Induction: Intravenous  Airway Management Planned:  Mask  Additional Equipment:   Intra-op Plan:   Post-operative Plan:   Informed Consent: I have reviewed the patients History and Physical, chart, labs and discussed the procedure including the risks, benefits and alternatives for the proposed anesthesia with the patient or authorized representative who has indicated his/her understanding and acceptance.   Dental advisory given  Plan Discussed with: Anesthesiologist and CRNA  Anesthesia Plan Comments:         Anesthesia Quick Evaluation

## 2016-10-28 NOTE — Transfer of Care (Signed)
Immediate Anesthesia Transfer of Care Note  Patient: Melinda Krause  Procedure(s) Performed: ECT  Patient Location: PACU  Anesthesia Type:General  Level of Consciousness: sedated  Airway & Oxygen Therapy: Patient Spontanous Breathing and Patient connected to face mask oxygen  Post-op Assessment: Report given to RN and Post -op Vital signs reviewed and stable  Post vital signs: Reviewed and stable  Last Vitals:  Vitals:   10/28/16 0903 10/28/16 1030  BP: (!) 176/74 (!) 195/98  Pulse: 75 84  Resp: 18 (!) 22  Temp: 36.7 C 36.8 C    Last Pain:  Vitals:   10/28/16 0916  TempSrc:   PainSc: 2       Patients Stated Pain Goal: 0 (10/28/16 0916)  Complications: No apparent anesthesia complications

## 2016-10-28 NOTE — Anesthesia Procedure Notes (Signed)
Date/Time: 10/28/2016 10:19 AM Performed by: Lily KocherPERALTA, Havard Radigan Pre-anesthesia Checklist: Patient identified, Emergency Drugs available, Suction available and Patient being monitored Patient Re-evaluated:Patient Re-evaluated prior to inductionOxygen Delivery Method: Circle system utilized Preoxygenation: Pre-oxygenation with 100% oxygen Intubation Type: IV induction Ventilation: Mask ventilation without difficulty and Mask ventilation throughout procedure Airway Equipment and Method: Bite block Placement Confirmation: positive ETCO2 Dental Injury: Teeth and Oropharynx as per pre-operative assessment

## 2016-10-28 NOTE — Procedures (Signed)
ECT SERVICES Physician's Interval Evaluation & Treatment Note  Patient Identification: Donnamarie Rossettierrilynn F Dutch MRN:  [id number] Date of Evaluation:  10/28/2016 TX #: 4  MADRS: 24  MMSE: 30  P.E. Findings:  Blood pressure is running high otherwise physical exam unremarkable  Psychiatric Interval Note:  Mood possibly slightly improved but not dramatically so no active suicidal ideation no psychosis. No clear memory impairment  Subjective:  Patient is a 55 y.o. female seen for evaluation for Electroconvulsive Therapy. Possible slight impairment. Mostly worried about her headaches  Treatment Summary:   [x]   Right Unilateral             []  Bilateral   % Energy : 0.3 ms 70%   Impedance: 1100 ohms  Seizure Energy Index: 8333 V squared  Postictal Suppression Index: 78%  Seizure Concordance Index: 95%  Medications  Pre Shock: Toradol 30 mg Brevital 140 mg succinylcholine 200 mg labetalol 20 mg  Post Shock: Versed 2 mg  Seizure Duration: 36 seconds by EMG 67 seconds by EEG   Comments: Follow-up Monday and then continued 3 times a week treatment   Lungs:  [x]   Clear to auscultation               []  Other:   Heart:    [x]   Regular rhythm             []  irregular rhythm    [x]   Previous H&P reviewed, patient examined and there are NO CHANGES                 []   Previous H&P reviewed, patient examined and there are changes noted.   Mordecai RasmussenJohn Clapacs, MD 12/6/201710:12 AM

## 2016-10-28 NOTE — Discharge Instructions (Signed)
1)  The drugs that you have been given will stay in your system until tomorrow so for the       next 24 hours you should not:  A. Drive an automobile  B. Make any legal decisions  C. Drink any alcoholic beverages  2)  You may resume your regular meals upon return home.  3)  A responsible adult must take you home.  Someone should stay with you for a few          hours, then be available by phone for the remainder of the treatment day.  4)  You May experience any of the following symptoms:  Headache, Nausea and a dry mouth (due to the medications you were given),  temporary memory loss and some confusion, or sore muscles (a warm bath  should help this).  If you you experience any of these symptoms let us know on                your return visit.  5)  Report any of the following: any acute discomfort, severe headache, or temperature        greater than 100.5 F.   Also report any unusual redness, swelling, drainage, or pain         at your IV site.    You may report Symptoms to:  ECT PROGRAM- [place] [facility]          Phone: [phone], ECT Department           or Dr. Shary Keylapac's office [phone]  6)  Your next ECT Treatment is Monday  November 02, 2016  We will call 2 days prior to your scheduled appointment for arrival times.  7)  Nothing to eat or drink after midnight the night before your procedure.  8)  Take Metoprolol and Prilosec     With a sip of water the morning of your procedure.  9)  Other Instructions: Call [phone] to cancel the morning of your procedure due         to illness or emergency.  10) We will call within 72 hours to assess how you are feeling.

## 2016-11-02 ENCOUNTER — Other Ambulatory Visit: Payer: Self-pay | Admitting: Psychiatry

## 2016-11-02 ENCOUNTER — Encounter: Payer: Self-pay | Admitting: Anesthesiology

## 2016-11-02 ENCOUNTER — Ambulatory Visit
Admission: RE | Admit: 2016-11-02 | Discharge: 2016-11-02 | Disposition: A | Discharge status: Home / Self Care | Payer: BLUE CROSS/BLUE SHIELD | Source: Ambulatory Visit | Attending: Psychiatry | Admitting: Psychiatry

## 2016-11-02 DIAGNOSIS — Z8261 Family history of arthritis: Secondary | ICD-10-CM | POA: Diagnosis not present

## 2016-11-02 DIAGNOSIS — Z885 Allergy status to narcotic agent status: Secondary | ICD-10-CM | POA: Diagnosis not present

## 2016-11-02 DIAGNOSIS — Z90710 Acquired absence of both cervix and uterus: Secondary | ICD-10-CM | POA: Diagnosis not present

## 2016-11-02 DIAGNOSIS — Z809 Family history of malignant neoplasm, unspecified: Secondary | ICD-10-CM | POA: Diagnosis not present

## 2016-11-02 DIAGNOSIS — I10 Essential (primary) hypertension: Secondary | ICD-10-CM | POA: Diagnosis not present

## 2016-11-02 DIAGNOSIS — K219 Gastro-esophageal reflux disease without esophagitis: Secondary | ICD-10-CM | POA: Diagnosis not present

## 2016-11-02 DIAGNOSIS — E039 Hypothyroidism, unspecified: Secondary | ICD-10-CM | POA: Insufficient documentation

## 2016-11-02 DIAGNOSIS — E785 Hyperlipidemia, unspecified: Secondary | ICD-10-CM | POA: Diagnosis not present

## 2016-11-02 DIAGNOSIS — F338 Other recurrent depressive disorders: Secondary | ICD-10-CM | POA: Insufficient documentation

## 2016-11-02 DIAGNOSIS — Z823 Family history of stroke: Secondary | ICD-10-CM | POA: Diagnosis not present

## 2016-11-02 DIAGNOSIS — Z6841 Body Mass Index (BMI) 40.0 and over, adult: Secondary | ICD-10-CM | POA: Insufficient documentation

## 2016-11-02 DIAGNOSIS — M797 Fibromyalgia: Secondary | ICD-10-CM | POA: Insufficient documentation

## 2016-11-02 DIAGNOSIS — Z825 Family history of asthma and other chronic lower respiratory diseases: Secondary | ICD-10-CM | POA: Diagnosis not present

## 2016-11-02 DIAGNOSIS — Z9119 Patient's noncompliance with other medical treatment and regimen: Secondary | ICD-10-CM | POA: Diagnosis not present

## 2016-11-02 DIAGNOSIS — F332 Major depressive disorder, recurrent severe without psychotic features: Secondary | ICD-10-CM | POA: Diagnosis not present

## 2016-11-02 DIAGNOSIS — Z8249 Family history of ischemic heart disease and other diseases of the circulatory system: Secondary | ICD-10-CM | POA: Diagnosis not present

## 2016-11-02 MED ORDER — KETOROLAC TROMETHAMINE 30 MG/ML IJ SOLN
INTRAMUSCULAR | Status: AC
  Filled 2016-11-02: qty 1

## 2016-11-02 MED ORDER — SODIUM CHLORIDE 0.9 % IV SOLN
500.0000 mL | Freq: Once | INTRAVENOUS | Status: AC
  Administered 2016-11-02: 1000 mL via INTRAVENOUS

## 2016-11-02 MED ORDER — SUCCINYLCHOLINE CHLORIDE 20 MG/ML IJ SOLN
INTRAMUSCULAR | Status: DC | PRN
  Administered 2016-11-02: 200 mg via INTRAVENOUS

## 2016-11-02 MED ORDER — IPRATROPIUM-ALBUTEROL 0.5-2.5 (3) MG/3ML IN SOLN
3.0000 mL | Freq: Once | RESPIRATORY_TRACT | Status: AC
  Administered 2016-11-02: 3 mL via RESPIRATORY_TRACT

## 2016-11-02 MED ORDER — LABETALOL HCL 5 MG/ML IV SOLN
INTRAVENOUS | Status: DC | PRN
  Administered 2016-11-02: 20 mg via INTRAVENOUS

## 2016-11-02 MED ORDER — MIDAZOLAM HCL 2 MG/2ML IJ SOLN
INTRAMUSCULAR | Status: DC | PRN
  Administered 2016-11-02: 2 mg via INTRAVENOUS

## 2016-11-02 MED ORDER — SODIUM CHLORIDE 0.9 % IV SOLN
INTRAVENOUS | Status: DC | PRN
  Administered 2016-11-02: 11:00:00 via INTRAVENOUS

## 2016-11-02 MED ORDER — KETOROLAC TROMETHAMINE 30 MG/ML IJ SOLN
30.0000 mg | Freq: Once | INTRAMUSCULAR | Status: AC
  Administered 2016-11-02: 30 mg via INTRAVENOUS

## 2016-11-02 MED ORDER — METHOHEXITAL SODIUM 100 MG/10ML IV SOSY
PREFILLED_SYRINGE | INTRAVENOUS | Status: DC | PRN
  Administered 2016-11-02: 140 mg via INTRAVENOUS

## 2016-11-02 NOTE — H&P (Signed)
Melinda Krause is an 55 y.o. female.   Chief Complaint: Feeling better. Energy level improved. More active. 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 Depression    [facility]  . Fibromyalgia    Rheumatologist- Dr. Ardis HughsShali Devashwar  . GERD (gastroesophageal reflux disease)   . Headache(784.0)    daily  . History of chicken pox    childhood  . Hyperlipidemia   . Hypertension   . Hypothyroidism   . Migraine    1-2 per month  . Morbid obesity with BMI of 45.0-49.9, adult (HCC)   . Neck injury    C-5-6  . Non compliance with medical treatment 04/02/2014  . Pain in joint, shoulder region 04/02/2013   left  . Peptic ulcer   . Seasonal allergies     Past Surgical History:  Procedure Laterality Date  . ABDOMINAL HYSTERECTOMY    . CESAREAN SECTION  1989  . COLONOSCOPY    . NM MYOVIEW LTD  04/18/2014   LexiScan: EF 60%; no evidence of ischemia or infarction. Normal stress test to  . RECTOCELE REPAIR  2009  . removal of vaginal mesh  11/2012  . TRANSTHORACIC ECHOCARDIOGRAM  04/18/2014   EF 55-60%. Difficult to assess for regional wall motion due to poor quality.  Normal diastolic pressures/function. No significant valvular lesions.  Marland Kitchen. UPPER GASTROINTESTINAL ENDOSCOPY    . VAGINA RECONSTRUCTION SURGERY  2011   vaginal mesh repair  . VAGINAL HYSTERECTOMY  2003    Family History  Problem Relation Age of Onset  . Emphysema Mother   . Allergies Mother   . Asthma Mother   . Cancer Mother   . Heart disease Mother   . Hypertension Mother   . Heart disease Father   . Hypertension Father   . Rheum arthritis Maternal Grandfather   . Cancer Maternal Grandmother   . Hypertension Paternal Grandmother   . Heart disease Paternal Grandmother   . Stroke Paternal Grandmother   . Heart disease Paternal Grandfather   . Hypertension Son   . Arrhythmia Son    Social History:   reports that she has never smoked. She has never used smokeless tobacco. She reports that she does not drink alcohol or use drugs.  Allergies:  Allergies  Allergen Reactions  . Demerol     Irritability, and shortness of breath     (Not in a hospital admission)  No results found for this or any previous visit (from the past 48 hour(s)). No results found.  Review of Systems  Constitutional: Negative.   HENT: Negative.   Eyes: Negative.   Respiratory: Negative.   Cardiovascular: Negative.   Gastrointestinal: Negative.   Musculoskeletal: Negative.   Skin: Negative.   Neurological: Negative.   Psychiatric/Behavioral: Positive for memory loss. Negative for depression, hallucinations, substance abuse and suicidal ideas. The patient is not nervous/anxious and does not have insomnia.     Blood pressure (!) 155/69, pulse 63, height 5\' 5"  (1.651 m), weight 135.6 kg (299 lb), SpO2 98 %. Physical Exam  Nursing note and vitals reviewed. Constitutional: She appears well-developed and well-nourished.  HENT:  Head: Normocephalic and atraumatic.  Eyes: Conjunctivae are normal. Pupils are equal, round, and reactive to light.  Neck: Normal range of motion.  Cardiovascular: Regular rhythm and normal heart sounds.   Respiratory: Effort normal. No respiratory distress.  GI: Soft.  Musculoskeletal: Normal range of motion.  Neurological: She is  alert.  Skin: Skin is warm and dry.  Psychiatric: She has a normal mood and affect. Her behavior is normal. Judgment and thought content normal.     Assessment/Plan Treatment number today continue 3 times a week schedule through the week  Mordecai RasmussenJohn Clapacs, MD 11/02/2016, 10:39 AM

## 2016-11-02 NOTE — Transfer of Care (Signed)
Immediate Anesthesia Transfer of Care Note  Patient: Melinda Krause  Procedure(s) Performed: * No procedures listed *  Patient Location: PACU  Anesthesia Type:General  Level of Consciousness: sedated  Airway & Oxygen Therapy: Patient Spontanous Breathing and Patient connected to face mask oxygen  Post-op Assessment: Report given to RN and Post -op Vital signs reviewed and stable  Post vital signs: Reviewed and stable  Last Vitals:  Vitals:   11/02/16 0913 11/02/16 1059  BP: (!) 155/69 (!) (P) 170/75  Pulse: 63 87  Resp:  12  Temp:  (P) 36.2 C    Complications: No apparent anesthesia complications

## 2016-11-02 NOTE — Procedures (Signed)
ECT SERVICES Physician's Interval Evaluation & Treatment Note  Patient Identification: Melinda Krause MRN:  [id number] Date of Evaluation:  11/02/2016 TX #: 5  MADRS:   MMSE:   P.E. Findings:  No change to physical exam  Psychiatric Interval Note:  Mood is improved energy level improved.  Subjective:  Patient is a 55 y.o. female seen for evaluation for Electroconvulsive Therapy. More motivation and energy  Treatment Summary:   [x]   Right Unilateral             []  Bilateral   % Energy : 0.3 ms 70%   Impedance: 980 ohms  Seizure Energy Index: 6187 V squared  Postictal Suppression Index: Less than 10%  Seizure Concordance Index: 92%  Medications  Pre Shock: Toradol 30 mg Brevital 140 mg succinylcholine 200 mg  Post Shock: Versed 2 mg  Seizure Duration: 12 seconds by EMG 41 seconds by EEG   Comments: Continue Wednesday and Friday this week although we may be getting close to a plateau soon.   Lungs:  [x]   Clear to auscultation               []  Other:   Heart:    [x]   Regular rhythm             []  irregular rhythm    [x]   Previous H&P reviewed, patient examined and there are NO CHANGES                 []   Previous H&P reviewed, patient examined and there are changes noted.   Mordecai RasmussenJohn Anwyn Kriegel, MD 12/11/201710:41 AM

## 2016-11-02 NOTE — Discharge Instructions (Signed)
1)  The drugs that you have been given will stay in your system until tomorrow so for the       next 24 hours you should not:  A. Drive an automobile  B. Make any legal decisions  C. Drink any alcoholic beverages  2)  You may resume your regular meals upon return home.  3)  A responsible adult must take you home.  Someone should stay with you for a few          hours, then be available by phone for the remainder of the treatment day.  4)  You May experience any of the following symptoms:  Headache, Nausea and a dry mouth (due to the medications you were given),  temporary memory loss and some confusion, or sore muscles (a warm bath  should help this).  If you you experience any of these symptoms let us know on                your return visit.  5)  Report any of the following: any acute discomfort, severe headache, or temperature        greater than 100.5 F.   Also report any unusual redness, swelling, drainage, or pain         at your IV site.    You may report Symptoms to:  ECT PROGRAM- [place] [facility]          Phone: [phone], ECT Department           or Dr. Shary Keylapac's office [phone]  6)  Your next ECT Treatment is Day Wednesday  Date November 04, 2016  We will call 2 days prior to your scheduled appointment for arrival times.  7)  Nothing to eat or drink after midnight the night before your procedure.  8)  Take Losartan, Metoprolol, Clonidine, Omeprazole, Levothyroxine    With a sip of water the morning of your procedure.  9)  Other Instructions: Call [phone] to cancel the morning of your procedure due         to illness or emergency.  10) We will call within 72 hours to assess how you are feeling.

## 2016-11-02 NOTE — Anesthesia Procedure Notes (Signed)
Performed by: Brittnae Aschenbrenner Pre-anesthesia Checklist: Patient identified, Emergency Drugs available, Suction available and Patient being monitored Patient Re-evaluated:Patient Re-evaluated prior to inductionOxygen Delivery Method: Circle system utilized Preoxygenation: Pre-oxygenation with 100% oxygen Intubation Type: IV induction Ventilation: Mask ventilation without difficulty and Mask ventilation throughout procedure Airway Equipment and Method: Bite block Placement Confirmation: positive ETCO2 Dental Injury: Teeth and Oropharynx as per pre-operative assessment        

## 2016-11-02 NOTE — Anesthesia Preprocedure Evaluation (Signed)
Anesthesia Evaluation  Patient identified by MRN, date of birth, ID band Patient awake    Reviewed: Allergy & Precautions, H&P , NPO status , Patient's Chart, lab work & pertinent test results  History of Anesthesia Complications Negative for: history of anesthetic complications  Airway Mallampati: II  TM Distance: >3 FB Neck ROM: Full    Dental  (+) Poor Dentition, Missing, Chipped   Pulmonary sleep apnea , neg COPD,    breath sounds clear to auscultation- rhonchi (-) wheezing      Cardiovascular Exercise Tolerance: Good hypertension, Pt. on medications (-) CAD, (-) Past MI and (-) Cardiac Stents  Rhythm:Regular Rate:Normal     Neuro/Psych  Headaches, PSYCHIATRIC DISORDERS Depression    GI/Hepatic Neg liver ROS, PUD, GERD  Controlled,  Endo/Other  neg diabetesHypothyroidism   Renal/GU negative Renal ROS     Musculoskeletal  (+) Fibromyalgia -  Abdominal (+) + obese,   Peds  Hematology negative hematology ROS (+)   Anesthesia Other Findings Past Medical History: No date: Depression     Comment: Counseling-Dr Pam [facility]man[facility] No date: Fibromyalgia     Comment: Rheumatologist- Dr. Ardis HughsShali Devashwar No date: GERD (gastroesophageal reflux disease) No date: Headache(784.0)     Comment: daily No date: History of chicken pox     Comment: childhood No date: Hyperlipidemia No date: Hypertension No date: Hypothyroidism No date: Migraine     Comment: 1-2 per month No date: Morbid obesity with BMI of 45.0-49.9, adult (H* No date: Neck injury     Comment: C-5-6 04/02/2014: Non compliance with medical treatment 04/02/2013: Pain in joint, shoulder region     Comment: left No date: Peptic ulcer No date: Seasonal allergies  BMI    Body Mass Index:  49.76 kg/m    Past Surgical History: No date: ABDOMINAL HYSTERECTOMY 1989: CESAREAN SECTION No date: COLONOSCOPY 04/18/2014: NM MYOVIEW LTD     Comment:  LexiScan: EF 60%; no evidence of ischemia or               infarction. Normal stress test to 2009: RECTOCELE REPAIR 11/2012: removal of vaginal mesh 04/18/2014: TRANSTHORACIC ECHOCARDIOGRAM     Comment: EF 55-60%. Difficult to assess for regional               wall motion due to poor quality.  Normal               diastolic pressures/function. No significant               valvular lesions. No date: UPPER GASTROINTESTINAL ENDOSCOPY 2011: VAGINA RECONSTRUCTION SURGERY     Comment: vaginal mesh repair 2003: VAGINAL HYSTERECTOMY    Reproductive/Obstetrics                             Anesthesia Physical  Anesthesia Plan  ASA: III  Anesthesia Plan: General   Post-op Pain Management:    Induction: Intravenous  Airway Management Planned: Mask  Additional Equipment:   Intra-op Plan:   Post-operative Plan:   Informed Consent: I have reviewed the patients History and Physical, chart, labs and discussed the procedure including the risks, benefits and alternatives for the proposed anesthesia with the patient or authorized representative who has indicated his/her understanding and acceptance.   Dental advisory given  Plan Discussed with: Anesthesiologist and CRNA  Anesthesia Plan Comments:         Anesthesia Quick Evaluation

## 2016-11-03 ENCOUNTER — Other Ambulatory Visit: Payer: Self-pay | Admitting: Psychiatry

## 2016-11-03 NOTE — Anesthesia Postprocedure Evaluation (Signed)
Anesthesia Post Note  Patient: Melinda Krause  Procedure(s) Performed: * No procedures listed *  Patient location during evaluation: Endoscopy Anesthesia Type: General Level of consciousness: awake and alert Pain management: pain level controlled Vital Signs Assessment: post-procedure vital signs reviewed and stable Respiratory status: spontaneous breathing, nonlabored ventilation, respiratory function stable and patient connected to nasal cannula oxygen Cardiovascular status: blood pressure returned to baseline and stable Postop Assessment: no signs of nausea or vomiting Anesthetic complications: no    Last Vitals:  Vitals:   11/02/16 1203 11/02/16 1217  BP:  (!) 152/78  Pulse: 83 84  Resp: (!) 22 18  Temp: 36.4 C     Last Pain:  Vitals:   11/02/16 1217  TempSrc: Oral  PainSc:                  Cleda MccreedyJoseph K Piscitello

## 2016-11-04 ENCOUNTER — Telehealth: Payer: Self-pay | Admitting: *Deleted

## 2016-11-05 ENCOUNTER — Other Ambulatory Visit: Payer: Self-pay | Admitting: Psychiatry

## 2016-11-06 ENCOUNTER — Encounter: Payer: Self-pay | Admitting: Anesthesiology

## 2016-11-06 ENCOUNTER — Ambulatory Visit
Admission: RE | Admit: 2016-11-06 | Discharge: 2016-11-06 | Disposition: A | Discharge status: Home / Self Care | Payer: BLUE CROSS/BLUE SHIELD | Source: Ambulatory Visit | Attending: Psychiatry | Admitting: Psychiatry

## 2016-11-06 DIAGNOSIS — Z9889 Other specified postprocedural states: Secondary | ICD-10-CM | POA: Diagnosis not present

## 2016-11-06 DIAGNOSIS — E039 Hypothyroidism, unspecified: Secondary | ICD-10-CM | POA: Insufficient documentation

## 2016-11-06 DIAGNOSIS — Z8249 Family history of ischemic heart disease and other diseases of the circulatory system: Secondary | ICD-10-CM | POA: Insufficient documentation

## 2016-11-06 DIAGNOSIS — Z8711 Personal history of peptic ulcer disease: Secondary | ICD-10-CM | POA: Insufficient documentation

## 2016-11-06 DIAGNOSIS — Z825 Family history of asthma and other chronic lower respiratory diseases: Secondary | ICD-10-CM | POA: Insufficient documentation

## 2016-11-06 DIAGNOSIS — K219 Gastro-esophageal reflux disease without esophagitis: Secondary | ICD-10-CM | POA: Insufficient documentation

## 2016-11-06 DIAGNOSIS — Z823 Family history of stroke: Secondary | ICD-10-CM | POA: Insufficient documentation

## 2016-11-06 DIAGNOSIS — F329 Major depressive disorder, single episode, unspecified: Secondary | ICD-10-CM | POA: Diagnosis not present

## 2016-11-06 DIAGNOSIS — M797 Fibromyalgia: Secondary | ICD-10-CM | POA: Diagnosis not present

## 2016-11-06 DIAGNOSIS — Z888 Allergy status to other drugs, medicaments and biological substances status: Secondary | ICD-10-CM | POA: Insufficient documentation

## 2016-11-06 DIAGNOSIS — Z809 Family history of malignant neoplasm, unspecified: Secondary | ICD-10-CM | POA: Diagnosis not present

## 2016-11-06 DIAGNOSIS — Z6841 Body Mass Index (BMI) 40.0 and over, adult: Secondary | ICD-10-CM | POA: Insufficient documentation

## 2016-11-06 DIAGNOSIS — I10 Essential (primary) hypertension: Secondary | ICD-10-CM | POA: Insufficient documentation

## 2016-11-06 DIAGNOSIS — F332 Major depressive disorder, recurrent severe without psychotic features: Secondary | ICD-10-CM | POA: Diagnosis not present

## 2016-11-06 DIAGNOSIS — E785 Hyperlipidemia, unspecified: Secondary | ICD-10-CM | POA: Diagnosis not present

## 2016-11-06 MED ORDER — ONDANSETRON HCL 4 MG/2ML IJ SOLN
INTRAMUSCULAR | Status: AC
  Administered 2016-11-06: 4 mg via INTRAVENOUS
  Filled 2016-11-06: qty 2

## 2016-11-06 MED ORDER — SUCCINYLCHOLINE CHLORIDE 200 MG/10ML IV SOSY
PREFILLED_SYRINGE | INTRAVENOUS | Status: DC | PRN
  Administered 2016-11-06: 200 mg via INTRAVENOUS

## 2016-11-06 MED ORDER — SODIUM CHLORIDE 0.9 % IV SOLN
INTRAVENOUS | Status: DC | PRN
  Administered 2016-11-06: 10:00:00 via INTRAVENOUS

## 2016-11-06 MED ORDER — KETOROLAC TROMETHAMINE 30 MG/ML IJ SOLN
INTRAMUSCULAR | Status: AC
  Administered 2016-11-06: 30 mg via INTRAVENOUS
  Filled 2016-11-06: qty 1

## 2016-11-06 MED ORDER — KETOROLAC TROMETHAMINE 30 MG/ML IJ SOLN
30.0000 mg | Freq: Once | INTRAMUSCULAR | Status: AC
  Administered 2016-11-06: 30 mg via INTRAVENOUS

## 2016-11-06 MED ORDER — SODIUM CHLORIDE 0.9 % IV SOLN
500.0000 mL | Freq: Once | INTRAVENOUS | Status: AC
  Administered 2016-11-06: 10:00:00 via INTRAVENOUS

## 2016-11-06 MED ORDER — MIDAZOLAM HCL 2 MG/2ML IJ SOLN
INTRAMUSCULAR | Status: DC | PRN
  Administered 2016-11-06: 2 mg via INTRAVENOUS

## 2016-11-06 MED ORDER — METHOHEXITAL SODIUM 100 MG/10ML IV SOSY
PREFILLED_SYRINGE | INTRAVENOUS | Status: DC | PRN
  Administered 2016-11-06: 140 mg via INTRAVENOUS

## 2016-11-06 MED ORDER — ONDANSETRON HCL 4 MG/2ML IJ SOLN
4.0000 mg | Freq: Once | INTRAMUSCULAR | Status: AC
  Administered 2016-11-06: 4 mg via INTRAVENOUS

## 2016-11-06 MED ORDER — LABETALOL HCL 5 MG/ML IV SOLN
INTRAVENOUS | Status: DC | PRN
  Administered 2016-11-06: 20 mg via INTRAVENOUS

## 2016-11-06 NOTE — Discharge Instructions (Addendum)
1)  The drugs that you have been given will stay in your system until tomorrow so for the       next 24 hours you should not:  A. Drive an automobile  B. Make any legal decisions  C. Drink any alcoholic beverages  2)  You may resume your regular meals upon return home.  3)  A responsible adult must take you home.  Someone should stay with you for a few          hours, then be available by phone for the remainder of the treatment day.  4)  You May experience any of the following symptoms:  Headache, Nausea and a dry mouth (due to the medications you were given),  temporary memory loss and some confusion, or sore muscles (a warm bath  should help this).  If you you experience any of these symptoms let us know on                your return visit.  5)  Report any of the following: any acute discomfort, severe headache, or temperature        greater than 100.5 F.   Also report any unusual redness, swelling, drainage, or pain         at your IV site.    You may report Symptoms to:  [facility] [place] [facility]          Phone: [phone], ECT Department           or Dr. Shary Keylapac's office [phone]  6)  Your next ECT Treatment is Monday, December 18 our scheduled appointment for arrival times.  7)  Nothing to eat or drink after midnight the night before your procedure.  8)  Take Toprol, HCTZ, Synthroid, priloces, clonodine, losartan    With a sip of water the morning of your procedure.  9)  Other Instructions: Call [phone] to cancel the morning of your procedure due         to illness or emergency.  10) We will call within 72 hours to assess how you are feeling.

## 2016-11-06 NOTE — Anesthesia Procedure Notes (Signed)
Date/Time: 11/06/2016 11:04 AM Performed by: Lily KocherPERALTA, Melinda Prestwood Pre-anesthesia Checklist: Patient identified, Emergency Drugs available, Suction available and Patient being monitored Patient Re-evaluated:Patient Re-evaluated prior to inductionOxygen Delivery Method: Circle system utilized Preoxygenation: Pre-oxygenation with 100% oxygen Intubation Type: IV induction Ventilation: Mask ventilation without difficulty and Mask ventilation throughout procedure Airway Equipment and Method: Bite block Placement Confirmation: positive ETCO2 Dental Injury: Teeth and Oropharynx as per pre-operative assessment

## 2016-11-06 NOTE — Anesthesia Preprocedure Evaluation (Signed)
Anesthesia Evaluation  Patient identified by MRN, date of birth, ID band Patient awake    Reviewed: Allergy & Precautions, H&P , NPO status , Patient's Chart, lab work & pertinent test results  History of Anesthesia Complications Negative for: history of anesthetic complications  Airway Mallampati: II  TM Distance: >3 FB Neck ROM: Full    Dental  (+) Poor Dentition, Missing, Chipped   Pulmonary sleep apnea , neg COPD,    breath sounds clear to auscultation- rhonchi (-) wheezing      Cardiovascular Exercise Tolerance: Good hypertension, Pt. on medications (-) CAD, (-) Past MI and (-) Cardiac Stents  Rhythm:Regular Rate:Normal     Neuro/Psych  Headaches, PSYCHIATRIC DISORDERS    GI/Hepatic Neg liver ROS, PUD, GERD  Controlled,  Endo/Other  neg diabetesHypothyroidism   Renal/GU negative Renal ROS     Musculoskeletal  (+) Fibromyalgia -  Abdominal (+) + obese,   Peds  Hematology negative hematology ROS (+)   Anesthesia Other Findings Past Medical History: No date: Depression     Comment: Counseling-Dr Pam [facility]man[facility] No date: Fibromyalgia     Comment: Rheumatologist- Dr. Ardis HughsShali Devashwar No date: GERD (gastroesophageal reflux disease) No date: Headache(784.0)     Comment: daily No date: History of chicken pox     Comment: childhood No date: Hyperlipidemia No date: Hypertension No date: Hypothyroidism No date: Migraine     Comment: 1-2 per month No date: Morbid obesity with BMI of 45.0-49.9, adult (H* No date: Neck injury     Comment: C-5-6 04/02/2014: Non compliance with medical treatment 04/02/2013: Pain in joint, shoulder region     Comment: left No date: Peptic ulcer No date: Seasonal allergies  BMI    Body Mass Index:  49.76 kg/m    Past Surgical History: No date: ABDOMINAL HYSTERECTOMY 1989: CESAREAN SECTION No date: COLONOSCOPY 04/18/2014: NM MYOVIEW LTD     Comment: LexiScan:  EF 60%; no evidence of ischemia or               infarction. Normal stress test to 2009: RECTOCELE REPAIR 11/2012: removal of vaginal mesh 04/18/2014: TRANSTHORACIC ECHOCARDIOGRAM     Comment: EF 55-60%. Difficult to assess for regional               wall motion due to poor quality.  Normal               diastolic pressures/function. No significant               valvular lesions. No date: UPPER GASTROINTESTINAL ENDOSCOPY 2011: VAGINA RECONSTRUCTION SURGERY     Comment: vaginal mesh repair 2003: VAGINAL HYSTERECTOMY    Reproductive/Obstetrics                             Anesthesia Physical  Anesthesia Plan  ASA: III  Anesthesia Plan: General   Post-op Pain Management:    Induction: Intravenous  Airway Management Planned: Mask  Additional Equipment:   Intra-op Plan:   Post-operative Plan:   Informed Consent: I have reviewed the patients History and Physical, chart, labs and discussed the procedure including the risks, benefits and alternatives for the proposed anesthesia with the patient or authorized representative who has indicated his/her understanding and acceptance.   Dental advisory given  Plan Discussed with: Anesthesiologist and CRNA  Anesthesia Plan Comments:         Anesthesia Quick Evaluation

## 2016-11-06 NOTE — Procedures (Signed)
ECT SERVICES Physician's Interval Evaluation & Treatment Note  Patient Identification: Melinda Krause MRN:  [id number] Date of Evaluation:  11/06/2016 TX #: [id number]  MADRS:   MMSE:   P.E. Findings:  No change to physical exam  Psychiatric Interval Note:  No change  Subjective:  Patient is a 55 y.o. female seen for evaluation for Electroconvulsive Therapy. No change  Treatment Summary:   [x]   Right Unilateral             []  Bilateral   % Energy : 0.3 ms 70%   Impedance: 1450 ohms  Seizure Energy Index: 7818 V squared  Postictal Suppression Index: 64%  Seizure Concordance Index: 90%  Medications  Pre Shock: Toradol 30 mg, Brevital 140 mg, succinylcholine 200 mg, labetalol 20 mg  Post Shock: Versed 2 mg  Seizure Duration: 14 seconds by EMG 31 seconds by EEG   Comments: Follow-up Monday   Lungs:  [x]   Clear to auscultation               []  Other:   Heart:    [x]   Regular rhythm             []  irregular rhythm    [x]   Previous H&P reviewed, patient examined and there are NO CHANGES                 []   Previous H&P reviewed, patient examined and there are changes noted.   Mordecai RasmussenJohn Kaylina Cahue, MD 12/15/201710:58 AM

## 2016-11-06 NOTE — Transfer of Care (Signed)
Immediate Anesthesia Transfer of Care Note  Patient: Melinda Krause  Procedure(s) Performed: ECT  Patient Location: PACU  Anesthesia Type:General  Level of Consciousness: sedated  Airway & Oxygen Therapy: Patient Spontanous Breathing and Patient connected to face mask oxygen  Post-op Assessment: Report given to RN and Post -op Vital signs reviewed and stable  Post vital signs: Reviewed and stable  Last Vitals:  Vitals:   11/06/16 0913 11/06/16 1116  BP: (!) 171/83 (!) 220/104  Pulse: 75 88  Resp: 18 (!) 22  Temp: 36.6 C 37.3 C    Last Pain:  Vitals:   11/06/16 0913  TempSrc: Oral         Complications: No apparent anesthesia complications

## 2016-11-06 NOTE — Anesthesia Postprocedure Evaluation (Signed)
Anesthesia Post Note  Patient: Melinda Krause  Procedure(s) Performed: * No procedures listed *  Patient location during evaluation: PACU Anesthesia Type: General Level of consciousness: awake and alert Pain management: pain level controlled Vital Signs Assessment: post-procedure vital signs reviewed and stable Respiratory status: spontaneous breathing, nonlabored ventilation, respiratory function stable and patient connected to nasal cannula oxygen Cardiovascular status: blood pressure returned to baseline and stable Postop Assessment: no signs of nausea or vomiting Anesthetic complications: no    Last Vitals:  Vitals:   11/06/16 1144 11/06/16 1152  BP: 107/67 (!) 158/71  Pulse:  81  Resp:  18  Temp: 37.1 C 37.1 C    Last Pain:  Vitals:   11/06/16 1152  TempSrc: Oral  PainSc:                  Lenard SimmerAndrew Willye Javier

## 2016-11-06 NOTE — H&P (Signed)
Melinda Krause is an 55 y.o. female.   Chief Complaint: Slightly improved depression HPI: History of depression  Past Medical History:  Diagnosis Date  . Depression    [facility]  . Fibromyalgia    Rheumatologist- Dr. Ardis HughsShali Devashwar  . GERD (gastroesophageal reflux disease)   . Headache(784.0)    daily  . History of chicken pox    childhood  . Hyperlipidemia   . Hypertension   . Hypothyroidism   . Migraine    1-2 per month  . Morbid obesity with BMI of 45.0-49.9, adult (HCC)   . Neck injury    C-5-6  . Non compliance with medical treatment 04/02/2014  . Pain in joint, shoulder region 04/02/2013   left  . Peptic ulcer   . Seasonal allergies     Past Surgical History:  Procedure Laterality Date  . ABDOMINAL HYSTERECTOMY    . CESAREAN SECTION  1989  . COLONOSCOPY    . NM MYOVIEW LTD  04/18/2014   LexiScan: EF 60%; no evidence of ischemia or infarction. Normal stress test to  . RECTOCELE REPAIR  2009  . removal of vaginal mesh  11/2012  . TRANSTHORACIC ECHOCARDIOGRAM  04/18/2014   EF 55-60%. Difficult to assess for regional wall motion due to poor quality.  Normal diastolic pressures/function. No significant valvular lesions.  Marland Kitchen. UPPER GASTROINTESTINAL ENDOSCOPY    . VAGINA RECONSTRUCTION SURGERY  2011   vaginal mesh repair  . VAGINAL HYSTERECTOMY  2003    Family History  Problem Relation Age of Onset  . Emphysema Mother   . Allergies Mother   . Asthma Mother   . Cancer Mother   . Heart disease Mother   . Hypertension Mother   . Heart disease Father   . Hypertension Father   . Rheum arthritis Maternal Grandfather   . Cancer Maternal Grandmother   . Hypertension Paternal Grandmother   . Heart disease Paternal Grandmother   . Stroke Paternal Grandmother   . Heart disease Paternal Grandfather   . Hypertension Son   . Arrhythmia Son    Social History:  reports that she has never smoked. She has never used smokeless tobacco.  She reports that she does not drink alcohol or use drugs.  Allergies:  Allergies  Allergen Reactions  . Demerol     Irritability, and shortness of breath     (Not in a hospital admission)  No results found for this or any previous visit (from the past 48 hour(s)). No results found.  Review of Systems  Constitutional: Negative.   HENT: Negative.   Eyes: Negative.   Respiratory: Negative.   Cardiovascular: Negative.   Gastrointestinal: Negative.   Musculoskeletal: Negative.   Skin: Negative.   Neurological: Negative.   Psychiatric/Behavioral: Positive for depression. Negative for hallucinations, memory loss, substance abuse and suicidal ideas. The patient is not nervous/anxious and does not have insomnia.     Blood pressure (!) 171/83, pulse 75, temperature 97.9 F (36.6 C), temperature source Oral, resp. rate 18, height 5\' 5"  (1.651 m), weight 132 kg (291 lb), SpO2 96 %. Physical Exam  Nursing note and vitals reviewed. Constitutional: She appears well-developed and well-nourished.  HENT:  Head: Normocephalic and atraumatic.  Eyes: Conjunctivae are normal. Pupils are equal, round, and reactive to light.  Neck: Normal range of motion.  Cardiovascular: Regular rhythm and normal heart sounds.   Respiratory: Effort normal. No respiratory distress.  GI: Soft.  Musculoskeletal: Normal range of motion.  Neurological: She  is alert.  Skin: Skin is warm and dry.  Psychiatric: She has a normal mood and affect. Her behavior is normal. Judgment and thought content normal.     Assessment/Plan Continue into next week  Mordecai RasmussenJohn Lizanne Erker, MD 11/06/2016, 10:56 AM

## 2016-11-08 ENCOUNTER — Other Ambulatory Visit: Payer: Self-pay | Admitting: Psychiatry

## 2016-11-09 ENCOUNTER — Telehealth: Payer: Self-pay

## 2016-11-24 ENCOUNTER — Other Ambulatory Visit: Payer: Self-pay | Admitting: Psychiatry

## 2016-11-25 ENCOUNTER — Encounter: Payer: Self-pay | Admitting: Anesthesiology

## 2016-11-25 ENCOUNTER — Telehealth: Payer: Self-pay

## 2016-11-25 ENCOUNTER — Inpatient Hospital Stay: Admission: RE | Admit: 2016-11-25 | Payer: Self-pay | Source: Ambulatory Visit

## 2016-12-03 ENCOUNTER — Other Ambulatory Visit: Payer: Self-pay | Admitting: Psychiatry

## 2016-12-04 ENCOUNTER — Encounter: Payer: Self-pay | Admitting: Anesthesiology

## 2016-12-04 ENCOUNTER — Telehealth: Payer: Self-pay

## 2016-12-04 ENCOUNTER — Inpatient Hospital Stay: Admission: RE | Admit: 2016-12-04 | Payer: Self-pay | Source: Ambulatory Visit

## 2016-12-23 ENCOUNTER — Ambulatory Visit (INDEPENDENT_AMBULATORY_CARE_PROVIDER_SITE_OTHER): Payer: BLUE CROSS/BLUE SHIELD | Admitting: Neurology

## 2016-12-23 ENCOUNTER — Encounter: Payer: Self-pay | Admitting: Neurology

## 2016-12-23 VITALS — BP 177/91 | HR 69 | Resp 20 | Ht 65.0 in | Wt 289.0 lb

## 2016-12-23 DIAGNOSIS — R208 Other disturbances of skin sensation: Secondary | ICD-10-CM

## 2016-12-23 DIAGNOSIS — E559 Vitamin D deficiency, unspecified: Secondary | ICD-10-CM | POA: Diagnosis not present

## 2016-12-23 DIAGNOSIS — F329 Major depressive disorder, single episode, unspecified: Secondary | ICD-10-CM | POA: Diagnosis not present

## 2016-12-23 DIAGNOSIS — F32A Depression, unspecified: Secondary | ICD-10-CM

## 2016-12-23 DIAGNOSIS — G629 Polyneuropathy, unspecified: Secondary | ICD-10-CM | POA: Diagnosis not present

## 2016-12-23 DIAGNOSIS — G4700 Insomnia, unspecified: Secondary | ICD-10-CM

## 2016-12-23 DIAGNOSIS — M797 Fibromyalgia: Secondary | ICD-10-CM

## 2016-12-23 DIAGNOSIS — G4733 Obstructive sleep apnea (adult) (pediatric): Secondary | ICD-10-CM

## 2016-12-23 DIAGNOSIS — R7989 Other specified abnormal findings of blood chemistry: Secondary | ICD-10-CM

## 2016-12-23 MED ORDER — LAMOTRIGINE 100 MG PO TABS: 100.0000 mg | ORAL_TABLET | Freq: Two times a day (BID) | ORAL | 5 refills | Status: DC

## 2016-12-23 NOTE — Progress Notes (Signed)
[facility]  PATIENT: Melinda Krause DOB: 03/31/1961  REFERRING DOCTOR OR PCP:  Sandford CrazeMelissa O'Sullivan SOURCE: patient and EMR  _________________________________   HISTORICAL  CHIEF COMPLAINT:  Chief Complaint  Patient presents with  . Extremity Pain    Melinda Krause was last seen for OSA in May 2016.(Not able to tolerate CPAP, does not have an oral appliance). Here today for eval of burning/tingling/pain in hands, lower legs and feet, onset about 2 yrs. ago, becoming more constant in the last 6mos. Both sides equally as bad.  Not diabetic, has not had an EMG/NCV./fim    HISTORY OF PRESENT ILLNESS: Melinda Krause is a 56 year old woman with obstructive sleep apnea and pain in her hands, legs and feet  Pain/dysesthesia:    For the past couple years, she has had progressive numbness, burning and tingling in the hands, lower legs and feet., Symptoms were more intermittent but they have become constant lately. She wakes up multiple times at night due to the pain and she notes cramping in the hands as well.   Does not have diabetes mellitus. She was checked for vasculitis and the ANA was negative. CRP was elevated but sedimentation rate was normal.   OSA:   She was diagnosed many year ago with OSA (severe with AHI = 52). .   She has CPAP but can't get used to the mask.   Many masks were tried.      She was never able to use it for 4 hours on average a night.    APAP did not help compliance.    Insomnia:   She has difficulty falling asleep and staying asleep.   Falling asleep is more difficult due to a lot of thoughts going through her mind.  Ambien helps some.  Mood:  She had severe depression and did [facility] x 6 at [facility].   She was very apathetic and wouldn't leave her home.   She is much better now.    Dr. Hulen SkainsAkin sees her and also prescribes her Ambien .   Ativan has nothelped.       Fibromyalgia:    She was diagnosed with FMS in the past.  She has a lot of myalgias in the  proximal muscles.  Pain is worse after a bad night's rest.    REVIEW OF SYSTEMS: Constitutional: No fevers, chills, sweats, or change in appetite.  She notes fatigue and insomnia. Eyes: No visual changes, double vision, eye pain Ear, nose and throat: No hearing loss, ear pain, nasal congestion, sore throat Cardiovascular: No chest pain, palpitations Respiratory: No shortness of breath at rest or with exertion.   No wheezes.   OSA on CPAP GastrointestinaI: No nausea, vomiting, diarrhea, abdominal pain, fecal incontinence Genitourinary: No dysuria, urinary retention or frequency.  No nocturia. Musculoskeletal: Notes some neck pain, back pain Integumentary: No rash, pruritus, skin lesions Neurological: as above Psychiatric: Notes depression and anxiety at this time.  Endocrine: No palpitations, diaphoresis, change in appetite, change in weigh or increased thirst Hematologic/Lymphatic: No anemia, purpura, petechiae. Allergic/Immunologic: No itchy/runny eyes, nasal congestion, recent allergic reactions, rashes  ALLERGIES: Allergies  Allergen Reactions  . Demerol     Irritability, and shortness of breath    HOME MEDICATIONS:  Current Outpatient Prescriptions:  .  acetaminophen (TYLENOL) 500 MG tablet, Take 1,000 mg by mouth every 6 (six) hours as needed., Disp: , Rfl:  .  cloNIDine (CATAPRES) 0.1 MG tablet, Take 1 tablet (0.1 mg total) by mouth 2 (two) times  daily. (Patient taking differently: Take 0.2 mg by mouth 2 (two) times daily. ), Disp: 60 tablet, Rfl: 2 .  cyclobenzaprine (FLEXERIL) 5 MG tablet, TAKE 1 TABLET BY MOUTH 3 TIMES DAILY AS NEEDED FOR MUSCLE SPASM., Disp: 60 tablet, Rfl: 0 .  hydrochlorothiazide (HYDRODIURIL) 25 MG tablet, Take 1 tablet (25 mg total) by mouth daily., Disp: 30 tablet, Rfl: 2 .  levothyroxine (SYNTHROID, LEVOTHROID) 150 MCG tablet, TAKE 1 TABLET BY MOUTH EVERY DAY, Disp: 90 tablet, Rfl: 0 .  levothyroxine (SYNTHROID, LEVOTHROID) 175 MCG tablet, Take  175 mcg by mouth daily before breakfast., Disp: , Rfl:  .  loratadine (CLARITIN) 10 MG tablet, Take 10 mg by mouth daily as needed for allergies., Disp: , Rfl:  .  LORazepam (ATIVAN) 0.5 MG tablet, Take 0.5 mg by mouth at bedtime., Disp: , Rfl:  .  losartan (COZAAR) 100 MG tablet, Take 1 tablet (100 mg total) by mouth daily., Disp: 30 tablet, Rfl: 11 .  metoprolol succinate (TOPROL-XL) 100 MG 24 hr tablet, TAKE 2 TABLETS BY MOUTH EVERY DAY, TAKE WITH OR IMMEDIATELY AFTER A MEAL, Disp: 60 tablet, Rfl: 0 .  Multiple Vitamins-Minerals (MULTIVITAMIN PO), Take 1 tablet by mouth daily., Disp: , Rfl:  .  naproxen sodium (ANAPROX) 220 MG tablet, Take 220 mg by mouth 2 (two) times daily with a meal., Disp: , Rfl:  .  omeprazole (PRILOSEC) 40 MG capsule, TAKE 1 CAPSULE (40 MG TOTAL) BY MOUTH DAILY., Disp: 30 capsule, Rfl: 0 .  venlafaxine XR (EFFEXOR-XR) 150 MG 24 hr capsule, TAKE 1 CAPSULE (150 MG TOTAL) BY MOUTH DAILY WITH BREAKFAST. FOR ANXIETY AND DEPRESSION. (Patient taking differently: TAKE 3 CAPSULE (450 MG TOTAL) BY MOUTH DAILY WITH BREAKFAST. FOR ANXIETY AND DEPRESSION.), Disp: 30 capsule, Rfl: 2 .  zolpidem (AMBIEN) 10 MG tablet, TAKE 1 TABLET BY MOUTH AT BEDTIME AS NEEDED FOR SLEEP, Disp: 30 tablet, Rfl: 0 .  amLODipine (NORVASC) 10 MG tablet, Take 1 tablet (10 mg total) by mouth daily. (Patient not taking: Reported on 12/23/2016), Disp: 30 tablet, Rfl: 3 .  buPROPion (WELLBUTRIN SR) 150 MG 12 hr tablet, TAKE ONE TAB BY MOUTH TWICE DAILY (Patient not taking: Reported on 12/23/2016), Disp: 60 tablet, Rfl: 2 .  Cholecalciferol (VITAMIN D-1000 MAX ST) 1000 UNITS tablet, Take 1 tablet by mouth daily., Disp: , Rfl:  .  FERROUS SULFATE PO, 1 PO QD W/VIT C-TO INCREASE ABSORPTION, Disp: , Rfl:   PAST MEDICAL HISTORY: Past Medical History:  Diagnosis Date  . Depression    Counseling-Dr [facility]  . Fibromyalgia    Rheumatologist- Dr. Ardis Hughs  . GERD (gastroesophageal reflux  disease)   . Headache(784.0)    daily  . History of chicken pox    childhood  . Hyperlipidemia   . Hypertension   . Hypothyroidism   . Migraine    1-2 per month  . Morbid obesity with BMI of 45.0-49.9, adult (HCC)   . Neck injury    C-5-6  . Non compliance with medical treatment 04/02/2014  . Pain in joint, shoulder region 04/02/2013   left  . Peptic ulcer   . Seasonal allergies     PAST SURGICAL HISTORY: Past Surgical History:  Procedure Laterality Date  . ABDOMINAL HYSTERECTOMY    . CESAREAN SECTION  1989  . COLONOSCOPY    . NM MYOVIEW LTD  04/18/2014   LexiScan: EF 60%; no evidence of ischemia or infarction. Normal stress test to  . RECTOCELE REPAIR  2009  .  removal of vaginal mesh  11/2012  . TRANSTHORACIC ECHOCARDIOGRAM  04/18/2014   EF 55-60%. Difficult to assess for regional wall motion due to poor quality.  Normal diastolic pressures/function. No significant valvular lesions.  Marland Kitchen UPPER GASTROINTESTINAL ENDOSCOPY    . VAGINA RECONSTRUCTION SURGERY  2011   vaginal mesh repair  . VAGINAL HYSTERECTOMY  2003    FAMILY HISTORY: Family History  Problem Relation Age of Onset  . Emphysema Mother   . Allergies Mother   . Asthma Mother   . Cancer Mother   . Heart disease Mother   . Hypertension Mother   . Heart disease Father   . Hypertension Father   . Rheum arthritis Maternal Grandfather   . Cancer Maternal Grandmother   . Hypertension Paternal Grandmother   . Heart disease Paternal Grandmother   . Stroke Paternal Grandmother   . Heart disease Paternal Grandfather   . Hypertension Son   . Arrhythmia Son     SOCIAL HISTORY:  Social History   Social History  . Marital status: Married    Spouse name: [name]  . Number of children: 3  . 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 Smoking status: Never Smoker  . Smokeless tobacco: Never Used  . Alcohol use No  . Drug use: No   . Sexual activity: Yes    Birth control/ protection: None   Other Topics Concern  . Not on file   Social History Narrative   [name]. Three children.   Patient has a Bachelor's degree.  Is currently employed.   Patient is right-handed.   Patient drinks 2-3 Diet sodas daily.   Does not Drink EtOH or smoke     PHYSICAL EXAM  Vitals:   12/23/16 1357  BP: (!) 177/91  Pulse: 69  Resp: 20  Weight: 289 lb (131.1 kg)  Height: 5\' 5"  (1.651 m)    Body mass index is 48.09 kg/m.   General: The patient is well-developed and well-nourished and in no acute distress  Neck: The neck is supple, no carotid bruits are noted.  The neck is mildly tender.  Cardiovascular: The heart has a regular rate and rhythm with a normal S1 and S2. There were no murmurs, gallops or rubs. Lungs are clear to auscultation.  Skin: Extremities are without significant edema.  Musculoskeletal:  She has mild tenderness over the muscles of the upper chest and back and some of the classic FMS tender points.  Neurologic Exam  Mental status: The patient is alert and oriented x 3 at the time of the examination. The patient has apparent normal recent and remote memory, with an apparently normal attention span and concentration ability.   Speech is normal.  Cranial nerves: Extraocular movements are full. Facial symmetry is present. There is good facial sensation to soft touch bilaterally.Facial strength is normal.  Trapezius and sternocleidomastoid strength is normal. No dysarthria is noted.  The tongue is midline, and the patient has symmetric elevation of the soft palate. No obvious hearing deficits are noted.  Motor:  Muscle bulk is normal.   Tone is normal. Strength is  5 / 5 in all 4 extremities.   Sensory: Sensory testing is intact to  touch and vibration sensation in all 4 extremities.  Coordination: Cerebellar testing reveals good finger-nose-finger and  heel-to-shin bilaterally.  Gait and station: Station is normal.   Gait is normal. Tandem gait is normal.   Reflexes: Deep tendon reflexes are symmetric and normal bilateral.    DIAGNOSTIC DATA (LABS, IMAGING, TESTING) - I reviewed patient records, labs, notes, testing and imaging myself where available.  Lab Results  Component Value Date   WBC 6.0 10/13/2016   HGB 14.2 10/13/2016   HCT 40.4 10/13/2016   MCV 88.6 10/13/2016   PLT 379 10/13/2016      Component Value Date/Time   NA 136 10/13/2016 1455   K 4.1 10/13/2016 1455   CL 100 (L) 10/13/2016 1455   CO2 30 10/13/2016 1455   GLUCOSE 114 (H) 10/13/2016 1455   BUN 12 10/13/2016 1455   CREATININE 0.84 10/13/2016 1455   CREATININE 0.87 11/03/2013 1452   CALCIUM 9.0 10/13/2016 1455   PROT 6.3 02/16/2014 0920   ALBUMIN 3.4 (L) 02/16/2014 0920   AST 20 02/16/2014 0920   ALT 18 02/16/2014 0920   ALKPHOS 99 02/16/2014 0920   BILITOT 0.6 02/16/2014 0920   GFRNONAA >60 10/13/2016 1455   GFRAA >60 10/13/2016 1455   Lab Results  Component Value Date   CHOL 172 08/09/2013   HDL 31 (L) 08/09/2013   LDLCALC 98 08/09/2013   TRIG 215 (H) 08/09/2013   CHOLHDL 5.5 08/09/2013   Lab Results  Component Value Date   HGBA1C 5.5 08/08/2013   No results found for: VITAMINB12 Lab Results  Component Value Date   TSH 2.86 09/12/2014       ASSESSMENT AND PLAN  OSA (obstructive sleep apnea)  Neuropathy (HCC)  Fibromyalgia  Depression, unspecified depression type  Insomnia, unspecified type    1.   NCV/EMG.   Check SPEP/IEF, cryoglobulin, vit B12, vit D 2.  Lamotrigine titrate to 100 mg po bid or higher. 3.   continue other med's 4.  We discussed the possibility of using an oral appliance as she does not think that she is able to tolerate CPAP. Alternatively, ENT surgery or weight loss surgery could also be considered as a treatment for her OSA. 5.    rtc for EMG or sooner if problems   Damisha Wolff A. Epimenio Foot, MD, PhD  12/23/2016, 2:07 PM Certified in Neurology, Clinical Neurophysiology, Sleep Medicine, Pain Medicine and Neuroimaging  [facility] [address], [address] [place], [place] [address] [phone]

## 2016-12-23 NOTE — Patient Instructions (Signed)
The pharmacy has the prescription for lamotrigine 100 mg tablets. For 5 days, just take one half pill a day. For the next 5 days, take one half pill twice a day. For the next 5 days, take one half pill 3 times a day Then start taking one pill twice a day from this point on.    In the future, we may increase the dose further.  If you get a rash, need to stop the medication and not take it again. 

## 2016-12-28 LAB — MULTIPLE MYELOMA PANEL, SERUM
Albumin SerPl Elph-Mcnc: 3.8 g/dL (ref 2.9–4.4)
Albumin/Glob SerPl: 1.5 (ref 0.7–1.7)
Alpha 1: 0.2 g/dL (ref 0.0–0.4)
Alpha2 Glob SerPl Elph-Mcnc: 0.7 g/dL (ref 0.4–1.0)
B-Globulin SerPl Elph-Mcnc: 1.2 g/dL (ref 0.7–1.3)
Gamma Glob SerPl Elph-Mcnc: 0.5 g/dL (ref 0.4–1.8)
Globulin, Total: 2.6 g/dL (ref 2.2–3.9)
IgA/Immunoglobulin A, Serum: 79 mg/dL — ABNORMAL LOW (ref 87–352)
IgG (Immunoglobin G), Serum: 470 mg/dL — ABNORMAL LOW (ref 700–1600)
IgM (Immunoglobulin M), Srm: 54 mg/dL (ref 26–217)
Total Protein: 6.4 g/dL (ref 6.0–8.5)

## 2016-12-28 LAB — VITAMIN D 25 HYDROXY (VIT D DEFICIENCY, FRACTURES): Vit D, 25-Hydroxy: 23.6 ng/mL — ABNORMAL LOW (ref 30.0–100.0)

## 2016-12-28 LAB — VITAMIN B12: Vitamin B-12: 501 pg/mL (ref 232–1245)

## 2016-12-28 LAB — CRYOGLOBULIN

## 2016-12-29 ENCOUNTER — Telehealth: Payer: Self-pay | Admitting: *Deleted

## 2016-12-29 MED ORDER — VITAMIN D (ERGOCALCIFEROL) 1.25 MG (50000 UNIT) PO CAPS: ORAL_CAPSULE | ORAL | 0 refills | Status: DC

## 2016-12-29 NOTE — Telephone Encounter (Signed)
I have spoken with Melinda Krause this afternoon and per RAS, explained that Vit. D level is low; he would like for her to take rx. Vit. D 50,000iu weekly for 12 weeks, then, once done with rx., take otc Vitamin D 2,000iu daily.  She verbalized understanding of same.  Rx. escribed to [facility] [address] per her request/fim

## 2016-12-29 NOTE — Telephone Encounter (Signed)
-----   Message from Asa Lenteichard A Sater, MD sent at 12/28/2016  5:08 PM EST ----- Please let her know that the vitamin D is low and I would like to take 50,000 units weekly 12 weeks then 2000 units OTC daily.  Other labs were normal.

## 2017-01-11 ENCOUNTER — Telehealth: Payer: Self-pay

## 2017-01-20 ENCOUNTER — Other Ambulatory Visit: Payer: Self-pay | Admitting: Psychiatry

## 2017-02-10 ENCOUNTER — Encounter: Payer: BLUE CROSS/BLUE SHIELD | Admitting: Neurology

## 2017-06-22 ENCOUNTER — Ambulatory Visit (INDEPENDENT_AMBULATORY_CARE_PROVIDER_SITE_OTHER): Payer: BLUE CROSS/BLUE SHIELD | Admitting: Neurology

## 2017-06-22 ENCOUNTER — Encounter: Payer: Self-pay | Admitting: Neurology

## 2017-06-22 VITALS — BP 184/89 | HR 65 | Resp 18 | Ht 65.0 in | Wt 286.5 lb

## 2017-06-22 DIAGNOSIS — R208 Other disturbances of skin sensation: Secondary | ICD-10-CM | POA: Diagnosis not present

## 2017-06-22 DIAGNOSIS — F329 Major depressive disorder, single episode, unspecified: Secondary | ICD-10-CM | POA: Diagnosis not present

## 2017-06-22 DIAGNOSIS — G629 Polyneuropathy, unspecified: Secondary | ICD-10-CM

## 2017-06-22 DIAGNOSIS — G4733 Obstructive sleep apnea (adult) (pediatric): Secondary | ICD-10-CM | POA: Diagnosis not present

## 2017-06-22 DIAGNOSIS — F32A Depression, unspecified: Secondary | ICD-10-CM

## 2017-06-22 MED ORDER — LAMOTRIGINE 150 MG PO TABS: 150.0000 mg | ORAL_TABLET | Freq: Two times a day (BID) | ORAL | 11 refills | Status: DC

## 2017-06-22 NOTE — Progress Notes (Signed)
[facility]  PATIENT: Melinda Krause DOB: 02/02/1961  REFERRING DOCTOR OR PCP:  Sandford CrazeMelissa O'Sullivan SOURCE: patient and EMR  _________________________________   HISTORICAL  CHIEF COMPLAINT:  Chief Complaint  Patient presents with  . Sleep Apnea    Untreated OSA  (couldn't adjust to CPAP, and can't afford dental visits for oral appliance). She c/o more trouble sleeping due to congestion when lying flat. Sleeps best in a recliner.   No relief  with any sleep med.  Sts. h/a's are about the same./fim  . Migraines    HISTORY OF PRESENT ILLNESS: Melinda Krause is a 56 year old woman with obstructive sleep apnea and pain in her hands, legs and feet  Pain/dysesthesia:   At the last visit lamotrigine was started and she feels about 40% better.  The painful numbness with burning and tingling in the hands and feet began about 4 years ago. It has been slightly progressive but actually more stable since starting lamotrigine.   .   Does not have diabetes mellitus. She was checked for vasculitis and the ANA was negative. CRP was elevated but sedimentation rate was normal.   OSA/insomnia:   She was diagnosed many year ago with OSA (severe with AHI = 52). .   She has CPAP but can't get used to the mask.  FF mask felt  claustophobic and the nasal pillows caused congestion.    Many masks were tried.      She was never able to use it for 4 hours on average a night.    APAP did not help compliance.    She wakes up gasping for air.    She has difficulty falling asleep and staying asleep.   Falling asleep is more difficult due to a lot of thoughts going through her mind.   Insomnia did not improve with benzodiazepine or with Ambien in the past.  Mood:  Mood is doing better the past year.   She had severe depression and [facility].   She was very apathetic and wouldn't leave her home.   She is much better now.   There is also some anxiety which also improved  REVIEW OF  SYSTEMS: Constitutional: No fevers, chills, sweats, or change in appetite.  She notes fatigue and insomnia. Eyes: No visual changes, double vision, eye pain Ear, nose and throat: No hearing loss, ear pain, nasal congestion, sore throat Cardiovascular: No chest pain, palpitations Respiratory: No shortness of breath at rest or with exertion.   No wheezes.   OSA on CPAP GastrointestinaI: No nausea, vomiting, diarrhea, abdominal pain, fecal incontinence Genitourinary: No dysuria, urinary retention or frequency.  No nocturia. Musculoskeletal: Notes some neck pain, back pain Integumentary: No rash, pruritus, skin lesions Neurological: as above Psychiatric: Notes depression and anxiety at this time.  Endocrine: No palpitations, diaphoresis, change in appetite, change in weigh or increased thirst Hematologic/Lymphatic: No anemia, purpura, petechiae. Allergic/Immunologic: No itchy/runny eyes, nasal congestion, recent allergic reactions, rashes  ALLERGIES: Allergies  Allergen Reactions  . Demerol     Irritability, and shortness of breath    HOME MEDICATIONS:  Current Outpatient Prescriptions:  .  acetaminophen (TYLENOL) 500 MG tablet, Take 1,000 mg by mouth every 6 (six) hours as needed., Disp: , Rfl:  .  cloNIDine (CATAPRES) 0.1 MG tablet, Take 1 tablet (0.1 mg total) by mouth 2 (two) times daily. (Patient taking differently: Take 0.2 mg by mouth 2 (two) times daily. ), Disp: 60 tablet, Rfl: 2 .  cyclobenzaprine (FLEXERIL) 5 MG tablet, TAKE 1 TABLET BY MOUTH 3 TIMES DAILY AS NEEDED FOR MUSCLE SPASM., Disp: 60 tablet, Rfl: 0 .  lamoTRIgine (LAMICTAL) 150 MG tablet, Take 1 tablet (150 mg total) by mouth 2 (two) times daily., Disp: 60 tablet, Rfl: 11 .  levothyroxine (SYNTHROID, LEVOTHROID) 175 MCG tablet, Take 175 mcg by mouth daily before breakfast., Disp: , Rfl:  .  loratadine (CLARITIN) 10 MG tablet, Take 10 mg by mouth daily as needed for allergies., Disp: , Rfl:  .  losartan (COZAAR)  100 MG tablet, Take 1 tablet (100 mg total) by mouth daily., Disp: 30 tablet, Rfl: 11 .  metoprolol succinate (TOPROL-XL) 100 MG 24 hr tablet, TAKE 2 TABLETS BY MOUTH EVERY DAY, TAKE WITH OR IMMEDIATELY AFTER A MEAL, Disp: 60 tablet, Rfl: 0 .  Multiple Vitamins-Minerals (MULTIVITAMIN PO), Take 1 tablet by mouth daily., Disp: , Rfl:  .  naproxen sodium (ANAPROX) 220 MG tablet, Take 220 mg by mouth 2 (two) times daily with a meal., Disp: , Rfl:  .  omeprazole (PRILOSEC) 40 MG capsule, TAKE 1 CAPSULE (40 MG TOTAL) BY MOUTH DAILY., Disp: 30 capsule, Rfl: 0 .  venlafaxine XR (EFFEXOR-XR) 150 MG 24 hr capsule, TAKE 1 CAPSULE (150 MG TOTAL) BY MOUTH DAILY WITH BREAKFAST. FOR ANXIETY AND DEPRESSION. (Patient taking differently: TAKE 3 CAPSULE (450 MG TOTAL) BY MOUTH DAILY WITH BREAKFAST. FOR ANXIETY AND DEPRESSION.), Disp: 30 capsule, Rfl: 2  PAST MEDICAL HISTORY: Past Medical History:  Diagnosis Date  . Depression    Counseling-Dr [facility]  . Fibromyalgia    Rheumatologist- Dr. Ardis Hughs  . GERD (gastroesophageal reflux disease)   . Headache(784.0)    daily  . History of chicken pox    childhood  . Hyperlipidemia   . Hypertension   . Hypothyroidism   . Migraine    1-2 per month  . Morbid obesity with BMI of 45.0-49.9, adult (HCC)   . Neck injury    C-5-6  . Non compliance with medical treatment 04/02/2014  . Pain in joint, shoulder region 04/02/2013   left  . Peptic ulcer   . Seasonal allergies     PAST SURGICAL HISTORY: Past Surgical History:  Procedure Laterality Date  . ABDOMINAL HYSTERECTOMY    . CESAREAN SECTION  1989  . COLONOSCOPY    . NM MYOVIEW LTD  04/18/2014   LexiScan: EF 60%; no evidence of ischemia or infarction. Normal stress test to  . RECTOCELE REPAIR  2009  . removal of vaginal mesh  11/2012  . TRANSTHORACIC ECHOCARDIOGRAM  04/18/2014   EF 55-60%. Difficult to assess for regional wall motion due to poor quality.  Normal diastolic  pressures/function. No significant valvular lesions.  Marland Kitchen UPPER GASTROINTESTINAL ENDOSCOPY    . VAGINA RECONSTRUCTION SURGERY  2011   vaginal mesh repair  . VAGINAL HYSTERECTOMY  2003    FAMILY HISTORY: Family History  Problem Relation Age of Onset  . Emphysema Mother   . Allergies Mother   . Asthma Mother   . Cancer Mother   . Heart disease Mother   . Hypertension Mother   . Heart disease Father   . Hypertension Father   . Rheum arthritis Maternal Grandfather   . Cancer Maternal Grandmother   . Hypertension Paternal Grandmother   . Heart disease Paternal Grandmother   . Stroke Paternal Grandmother   . Heart disease Paternal Grandfather   . Hypertension Son   . Arrhythmia Son     SOCIAL  HISTORY:  Social History   Social History  . Marital status: Married    Spouse name: Harolyn RutherfordLucian  . Number of children: 3  . 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 Smoking status: Never Smoker  . Smokeless tobacco: Never Used  . Alcohol use No  . Drug use: No  . Sexual activity: Yes    Birth control/ protection: None   Other Topics Concern  . Not on file   Social History Narrative   Patient is married [name]([name]) and lives at home with her husband. Three children.   Patient has a Bachelor's degree.  Is currently employed.   Patient is right-handed.   Patient drinks 2-3 Diet sodas daily.   Does not Drink EtOH or smoke     PHYSICAL EXAM  Vitals:   06/22/17 1603  BP: (!) 184/89  Pulse: 65  Resp: 18  Weight: 286 lb 8 oz (130 kg)  Height: 5\' 5"  (1.651 m)    Body mass index is 47.68 kg/m.   General: The patient is well-developed and well-nourished and in no acute distress   Skin: Extremities are without rash or edema.  Musculoskeletal:  She has mild tenderness over the muscles of the upper chest and back and some of the classic FMS tender points.  Neurologic Exam  Mental status: The  patient is alert and oriented x 3 at the time of the examination. The patient has apparent normal recent and remote memory, with an apparently normal attention span and concentration ability.   Speech is normal.  Cranial nerves: Extraocular movements are full. Facial strength and sensation is normal. Trapezius strength is normal..  The tongue is midline, and the patient has symmetric elevation of the soft palate. No obvious hearing deficits are noted.  Motor:  Muscle bulk is normal.   Tone is normal. Strength is  5 / 5 in all 4 extremities.   Sensory: Sensory testing is intact to touch and vibration sensation proximally but she reports decreased vibration sensation in the fingertips and toes. There is milder loss of vibration sensation at the ankles. There is also reduced touch and temperature sensation in the feet  Coordination: Cerebellar testing reveals good finger-nose-finger and heel-to-shin bilaterally.  Gait and station: Station is normal.   Gait is normal. Tandem gait is normal.   Reflexes: Deep tendon reflexes are symmetric and normal bilateral.    DIAGNOSTIC DATA (LABS, IMAGING, TESTING) - I reviewed patient records, labs, notes, testing and imaging myself where available.  Lab Results  Component Value Date   WBC 6.0 10/13/2016   HGB 14.2 10/13/2016   HCT 40.4 10/13/2016   MCV 88.6 10/13/2016   PLT 379 10/13/2016      Component Value Date/Time   NA 136 10/13/2016 1455   K 4.1 10/13/2016 1455   CL 100 (L) 10/13/2016 1455   CO2 30 10/13/2016 1455   GLUCOSE 114 (H) 10/13/2016 1455   BUN 12 10/13/2016 1455   CREATININE 0.84 10/13/2016 1455   CREATININE 0.87 11/03/2013 1452   CALCIUM 9.0 10/13/2016 1455   PROT 6.4 12/23/2016 1509   ALBUMIN 3.4 (L) 02/16/2014 0920   AST 20 02/16/2014 0920   ALT 18 02/16/2014 0920   ALKPHOS 99 02/16/2014 0920   BILITOT 0.6 02/16/2014 0920   GFRNONAA >60 10/13/2016 1455   GFRAA >60 10/13/2016 1455   Lab Results  Component  Value Date     CHOL 172 08/09/2013   HDL 31 (L) 08/09/2013   LDLCALC 98 08/09/2013   TRIG 215 (H) 08/09/2013   CHOLHDL 5.5 08/09/2013   Lab Results  Component Value Date   HGBA1C 5.5 08/08/2013   Lab Results  Component Value Date   VITAMINB12 501 12/23/2016   Lab Results  Component Value Date   TSH 2.86 09/12/2014       ASSESSMENT AND PLAN  Neuropathy  OSA (obstructive sleep apnea) - Plan: Ambulatory referral to ENT  Depression, unspecified depression type  Dysesthesia   1.  We discussed getting a nerve conduction and EMG study. However, she has a high deductible and would prefer to wait  2.  Increase lamotrigine to 150 mg twice a day.  3.   She is unable to tolerate CPAP. We discussed options including an oral appliance and ENT surgery. She would like to be evaluated by ENT.  4,   RTC 1 year, sooner if problems    Richard A. Epimenio Foot, MD, PhD 06/22/2017, 4:58 PM Certified in Neurology, Clinical Neurophysiology, Sleep Medicine, Pain Medicine and Neuroimaging  [facility] [address] [place], [place] [address] [phone]

## 2017-06-23 HISTORY — PX: TRANSTHORACIC ECHOCARDIOGRAM: SHX275

## 2017-07-13 ENCOUNTER — Other Ambulatory Visit: Payer: Self-pay | Admitting: Neurology

## 2018-06-23 ENCOUNTER — Ambulatory Visit: Payer: BLUE CROSS/BLUE SHIELD | Admitting: Neurology

## 2018-07-12 ENCOUNTER — Other Ambulatory Visit: Payer: Self-pay | Admitting: Neurology

## 2018-07-27 ENCOUNTER — Ambulatory Visit: Payer: BLUE CROSS/BLUE SHIELD | Admitting: Neurology

## 2018-07-27 ENCOUNTER — Encounter: Payer: Self-pay | Admitting: Neurology

## 2018-07-27 VITALS — BP 169/77 | HR 82 | Resp 20 | Ht 65.0 in | Wt 331.5 lb

## 2018-07-27 DIAGNOSIS — G4733 Obstructive sleep apnea (adult) (pediatric): Secondary | ICD-10-CM

## 2018-07-27 DIAGNOSIS — G4700 Insomnia, unspecified: Secondary | ICD-10-CM

## 2018-07-27 DIAGNOSIS — F329 Major depressive disorder, single episode, unspecified: Secondary | ICD-10-CM

## 2018-07-27 DIAGNOSIS — F32A Depression, unspecified: Secondary | ICD-10-CM

## 2018-07-27 DIAGNOSIS — G4730 Sleep apnea, unspecified: Secondary | ICD-10-CM

## 2018-07-27 DIAGNOSIS — R208 Other disturbances of skin sensation: Secondary | ICD-10-CM | POA: Diagnosis not present

## 2018-07-27 MED ORDER — LAMOTRIGINE 200 MG PO TABS: 200.0000 mg | ORAL_TABLET | Freq: Two times a day (BID) | ORAL | 11 refills | Status: DC

## 2018-07-27 MED ORDER — ZOLPIDEM TARTRATE 5 MG PO TABS: 5.0000 mg | ORAL_TABLET | Freq: Every evening | ORAL | 5 refills | Status: DC | PRN

## 2018-07-27 NOTE — Progress Notes (Signed)
[facility]  PATIENT: Melinda Krause DOB: 1961/07/16  REFERRING DOCTOR OR PCP:  Sandford Craze SOURCE: patient and EMR  _________________________________   HISTORICAL  CHIEF COMPLAINT:  Chief Complaint  Patient presents with  . Peripheral Neuropathy    Sts. neuropathy is some better with increased Lamictal. Sts. has seen an ENT for tx. options for OSA, sts. was told she is a candidate for a procedure to potentially help OSA.  She has gained more than 40lbs since last ov; belives this is related to menopause/fim  . Sleep Apnea    HISTORY OF PRESENT ILLNESS:  Melinda Krause is a 57 y.o. woman with obstructive sleep apnea and pain in her hands, legs and feet  Update 07/27/2018: She is still noting numbness with a prickling sensation and superimposed stings.    Lamotrigine has helped at 150 mg po bid.  She tolerates it well.    Hands are almost pain free now but the feet are only slightly better.       She has OSA.    She had trouble wearing a mask.   Her pain and stuffed up sensation to her nose led to problems.  Many masks have been tried.    In 2009 a PSG showed an AHI equals 56.  She was titrated to CPAP +8 cm during the split-night study.  Depression is doing well.  This was a major problem in the past.  She continues on Effexor and tolerates it well.  She no longer sees psychiatry.   From 06/22/2017: Pain/dysesthesia:   At the last visit lamotrigine was started and she feels about 40% better.  The painful numbness with burning and tingling in the hands and feet began about 4 years ago. It has been slightly progressive but actually more stable since starting lamotrigine.   .   Does not have diabetes mellitus. She was checked for vasculitis and the ANA was negative. CRP was elevated but sedimentation rate was normal.   OSA/insomnia:   She was diagnosed many year ago with OSA (severe with AHI = 52). .   She has CPAP but can't get used to the mask.  FF  mask felt  claustophobic and the nasal pillows caused congestion.    Many masks were tried.      She was never able to use it for 4 hours on average a night.    APAP did not help compliance.    She wakes up gasping for air.    She has difficulty falling asleep and staying asleep.   Falling asleep is more difficult due to a lot of thoughts going through her mind.   Insomnia did not improve with benzodiazepine or with Ambien in the past.  Mood:  Mood is doing better the past year.   She had severe depression and did ECT x 6 at [facility].   She was very apathetic and wouldn't leave her home.   She is much better now.   There is also some anxiety which also improved  REVIEW OF SYSTEMS: Constitutional: No fevers, chills, sweats, or change in appetite.  She has insomnia that is worse when she wears her OSA mask.. Eyes: No visual changes, double vision, eye pain Ear, nose and throat: No hearing loss, ear pain, nasal congestion, sore throat Cardiovascular: No chest pain, palpitations Respiratory: No shortness of breath at rest or with exertion.   No wheezes.   OSA on CPAP intermittently GastrointestinaI: No nausea, vomiting, diarrhea, abdominal pain, fecal  incontinence Genitourinary: No dysuria, urinary retention or frequency.  No nocturia. Musculoskeletal: Notes some neck pain, back pain Integumentary: No rash, pruritus, skin lesions Neurological: as above Psychiatric: Notes depression and anxiety at this time.  Endocrine: No palpitations, diaphoresis, change in appetite, change in weigh or increased thirst Hematologic/Lymphatic: No anemia, purpura, petechiae. Allergic/Immunologic: No itchy/runny eyes, nasal congestion, recent allergic reactions, rashes  ALLERGIES: Allergies  Allergen Reactions  . Demerol     Irritability, and shortness of breath    HOME MEDICATIONS:  Current Outpatient Medications:  .  acetaminophen (TYLENOL) 500 MG tablet, Take 1,000 mg by mouth every 6 (six) hours as  needed., Disp: , Rfl:  .  cloNIDine (CATAPRES) 0.1 MG tablet, Take 1 tablet (0.1 mg total) by mouth 2 (two) times daily. (Patient taking differently: Take 0.2 mg by mouth 2 (two) times daily. ), Disp: 60 tablet, Rfl: 2 .  cyclobenzaprine (FLEXERIL) 5 MG tablet, TAKE 1 TABLET BY MOUTH 3 TIMES DAILY AS NEEDED FOR MUSCLE SPASM., Disp: 60 tablet, Rfl: 0 .  lamoTRIgine (LAMICTAL) 200 MG tablet, Take 1 tablet (200 mg total) by mouth 2 (two) times daily., Disp: 60 tablet, Rfl: 11 .  levothyroxine (SYNTHROID, LEVOTHROID) 175 MCG tablet, Take 175 mcg by mouth daily before breakfast., Disp: , Rfl:  .  loratadine (CLARITIN) 10 MG tablet, Take 10 mg by mouth daily as needed for allergies., Disp: , Rfl:  .  losartan (COZAAR) 100 MG tablet, Take 1 tablet (100 mg total) by mouth daily., Disp: 30 tablet, Rfl: 11 .  metoprolol succinate (TOPROL-XL) 100 MG 24 hr tablet, TAKE 2 TABLETS BY MOUTH EVERY DAY, TAKE WITH OR IMMEDIATELY AFTER A MEAL, Disp: 60 tablet, Rfl: 0 .  Multiple Vitamins-Minerals (MULTIVITAMIN PO), Take 1 tablet by mouth daily., Disp: , Rfl:  .  naproxen sodium (ANAPROX) 220 MG tablet, Take 220 mg by mouth 2 (two) times daily with a meal., Disp: , Rfl:  .  omeprazole (PRILOSEC) 40 MG capsule, TAKE 1 CAPSULE (40 MG TOTAL) BY MOUTH DAILY., Disp: 30 capsule, Rfl: 0 .  venlafaxine XR (EFFEXOR-XR) 150 MG 24 hr capsule, TAKE 1 CAPSULE (150 MG TOTAL) BY MOUTH DAILY WITH BREAKFAST. FOR ANXIETY AND DEPRESSION. (Patient taking differently: TAKE 3 CAPSULE (450 MG TOTAL) BY MOUTH DAILY WITH BREAKFAST. FOR ANXIETY AND DEPRESSION.), Disp: 30 capsule, Rfl: 2 .  zolpidem (AMBIEN) 5 MG tablet, Take 1 tablet (5 mg total) by mouth at bedtime as needed for sleep., Disp: 30 tablet, Rfl: 5  PAST MEDICAL HISTORY: Past Medical History:  Diagnosis Date  . Depression    Counseling-Dr [facility]  . Fibromyalgia    Rheumatologist- Dr. Ardis Hughs  . GERD (gastroesophageal reflux disease)   .  Headache(784.0)    daily  . History of chicken pox    childhood  . Hyperlipidemia   . Hypertension   . Hypothyroidism   . Migraine    1-2 per month  . Morbid obesity with BMI of 45.0-49.9, adult (HCC)   . Neck injury    C-5-6  . Non compliance with medical treatment 04/02/2014  . Pain in joint, shoulder region 04/02/2013   left  . Peptic ulcer   . Seasonal allergies     PAST SURGICAL HISTORY: Past Surgical History:  Procedure Laterality Date  . ABDOMINAL HYSTERECTOMY    . CESAREAN SECTION  1989  . COLONOSCOPY    . NM MYOVIEW LTD  04/18/2014   LexiScan: EF 60%; no evidence of ischemia or infarction. Normal  stress test to  . RECTOCELE REPAIR  2009  . removal of vaginal mesh  11/2012  . TRANSTHORACIC ECHOCARDIOGRAM  04/18/2014   EF 55-60%. Difficult to assess for regional wall motion due to poor quality.  Normal diastolic pressures/function. No significant valvular lesions.  Marland Kitchen UPPER GASTROINTESTINAL ENDOSCOPY    . VAGINA RECONSTRUCTION SURGERY  2011   vaginal mesh repair  . VAGINAL HYSTERECTOMY  2003    FAMILY HISTORY: Family History  Problem Relation Age of Onset  . Emphysema Mother   . Allergies Mother   . Asthma Mother   . Cancer Mother   . Heart disease Mother   . Hypertension Mother   . Heart disease Father   . Hypertension Father   . Rheum arthritis Maternal Grandfather   . Cancer Maternal Grandmother   . Hypertension Paternal Grandmother   . Heart disease Paternal Grandmother   . Stroke Paternal Grandmother   . Heart disease Paternal Grandfather   . Hypertension Son   . Arrhythmia Son     SOCIAL HISTORY:  Social History   Socioeconomic History  . Marital status: Married    Spouse name: [name]  . Number of children: 3  . Years of education: Bachelor's  . Highest education level: Not on file  Occupational History  . Occupation: Charity fundraiser    Comment: has not worked in 2 years  Social Needs  . Financial resource strain: Not on file  . Food insecurity:     Worry: Not on file    Inability: Not on file  . Transportation needs:    Medical: Not on file    Non-medical: Not on file  Tobacco Use  . Smoking status: Never Smoker  . Smokeless tobacco: Never Used  Substance and Sexual Activity  . Alcohol use: No  . Drug use: No  . Sexual activity: Yes    Birth control/protection: None  Lifestyle  . Physical activity:    Days per week: Not on file    Minutes per session: Not on file  . Stress: Not on file  Relationships  . Social connections:    Talks on phone: Not on file    Gets together: Not on file    Attends religious service: Not on file    Active member of club or organization: Not on file    Attends meetings of clubs or organizations: Not on file    Relationship status: Not on file  . Intimate partner violence:    Fear of current or ex partner: Not on file    Emotionally abused: Not on file    Physically abused: Not on file    Forced sexual activity: Not on file  Other Topics Concern  . Not on file  Social History Narrative   [name]. Three children.   Patient has a Bachelor's degree.  Is currently employed.   Patient is right-handed.   Patient drinks 2-3 Diet sodas daily.   Does not Drink EtOH or smoke     PHYSICAL EXAM  Vitals:   07/27/18 1039  BP: (!) 169/77  Pulse: 82  Resp: 20  Weight: (!) 331 lb 8 oz (150.4 kg)  Height: 5\' 5"  (1.651 m)    Body mass index is 55.16 kg/m.   General: The patient is well-developed and well-nourished and in no acute distress   Skin: Extremities are without rash or edema.  Musculoskeletal:  She has mild tenderness over the muscles of  the upper chest and back and some of the classic FMS tender points.  Neurologic Exam  Mental status: The patient is alert and oriented x 3 at the time of the examination. The patient has apparent normal recent and remote memory, with an apparently normal attention span and concentration  ability.   Speech is normal.  Cranial nerves: Extraocular movements are full. Facial strength and sensation is normal. Trapezius strength is normal..  The tongue is midline, and the patient has symmetric elevation of the soft palate. No obvious hearing deficits are noted.  Motor:  Muscle bulk is normal.   Tone is normal.  Strength was 5/5 in the arms and legs including the intrinsic foot muscles.  Sensory: Sensory testing is intact to touch and vibration sensation in the arms and proximally in the legs.  She reports decreased vibration and touch sensation in the distal foot.    Coordination: Cerebellar testing shows good finger-nose-finger and heel-to-shin.  Gait and station: Station is normal.   Gait is normal. Tandem gait is normal.   Reflexes: Deep tendon reflexes are symmetric and normal bilateral.    DIAGNOSTIC DATA (LABS, IMAGING, TESTING) - I reviewed patient records, labs, notes, testing and imaging myself where available.  Lab Results  Component Value Date   WBC 6.0 10/13/2016   HGB 14.2 10/13/2016   HCT 40.4 10/13/2016   MCV 88.6 10/13/2016   PLT 379 10/13/2016      Component Value Date/Time   NA 136 10/13/2016 1455   K 4.1 10/13/2016 1455   CL 100 (L) 10/13/2016 1455   CO2 30 10/13/2016 1455   GLUCOSE 114 (H) 10/13/2016 1455   BUN 12 10/13/2016 1455   CREATININE 0.84 10/13/2016 1455   CREATININE 0.87 11/03/2013 1452   CALCIUM 9.0 10/13/2016 1455   PROT 6.4 12/23/2016 1509   ALBUMIN 3.4 (L) 02/16/2014 0920   AST 20 02/16/2014 0920   ALT 18 02/16/2014 0920   ALKPHOS 99 02/16/2014 0920   BILITOT 0.6 02/16/2014 0920   GFRNONAA >60 10/13/2016 1455   GFRAA >60 10/13/2016 1455   Lab Results  Component Value Date   CHOL 172 08/09/2013   HDL 31 (L) 08/09/2013   LDLCALC 98 08/09/2013   TRIG 215 (H) 08/09/2013   CHOLHDL 5.5 08/09/2013   Lab Results  Component Value Date   HGBA1C 5.5 08/08/2013   Lab Results  Component Value Date   VITAMINB12 501 12/23/2016    Lab Results  Component Value Date   TSH 2.86 09/12/2014       ASSESSMENT AND PLAN  OSA (obstructive sleep apnea)  Dysesthesia  Insomnia w/ sleep apnea  Depression, unspecified depression type   1.  Lamotrigine will be increased to 200 mg p.o. twice daily neuropathy.  It may also help mood. 2.  She is having difficulty with CPAP.  Multiple masks have not been successful.  She has not tried a DreamWear mask.  If this is not a benefit, also consider a re-titration as BiPAP might be more comfortable for her. 3.   Continue venlafaxine. 4,   RTC 1 year, sooner if problems    Richard A. Epimenio Foot, MD, PhD 07/27/2018, 11:36 AM Certified in Neurology, Clinical Neurophysiology, Sleep Medicine, Pain Medicine and Neuroimaging  [facility] [address], [address] [place], [place] [address] [phone]

## 2018-08-08 ENCOUNTER — Encounter: Payer: Self-pay | Admitting: Cardiology

## 2018-08-08 ENCOUNTER — Ambulatory Visit: Payer: BLUE CROSS/BLUE SHIELD | Admitting: Cardiology

## 2018-08-08 ENCOUNTER — Encounter

## 2018-08-08 VITALS — BP 152/80 | HR 73 | Ht 64.0 in | Wt 326.4 lb

## 2018-08-08 DIAGNOSIS — E782 Mixed hyperlipidemia: Secondary | ICD-10-CM

## 2018-08-08 DIAGNOSIS — R0609 Other forms of dyspnea: Secondary | ICD-10-CM | POA: Diagnosis not present

## 2018-08-08 DIAGNOSIS — R600 Localized edema: Secondary | ICD-10-CM | POA: Diagnosis not present

## 2018-08-08 DIAGNOSIS — I10 Essential (primary) hypertension: Secondary | ICD-10-CM | POA: Diagnosis not present

## 2018-08-08 DIAGNOSIS — R079 Chest pain, unspecified: Secondary | ICD-10-CM

## 2018-08-08 DIAGNOSIS — G4733 Obstructive sleep apnea (adult) (pediatric): Secondary | ICD-10-CM

## 2018-08-08 DIAGNOSIS — Z6841 Body Mass Index (BMI) 40.0 and over, adult: Secondary | ICD-10-CM

## 2018-08-08 DIAGNOSIS — E8881 Metabolic syndrome: Secondary | ICD-10-CM | POA: Insufficient documentation

## 2018-08-08 DIAGNOSIS — R739 Hyperglycemia, unspecified: Secondary | ICD-10-CM

## 2018-08-08 DIAGNOSIS — R0600 Dyspnea, unspecified: Secondary | ICD-10-CM

## 2018-08-08 NOTE — Assessment & Plan Note (Signed)
Clearly, this is a compounding feature for her exertional dyspnea and even potentially hypertension.  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  This is also contributing to her cholesterol and blood pressure.  Discussed importance of weight loss.  She is previously been referred to a nutritionist.  This makes a more major component of metabolic syndrome.

## 2018-08-08 NOTE — Patient Instructions (Addendum)
MEDICATION INSTRUCTIONS--   STOP METOPROLOL  START CARVEDILOL 12.5 MG TWICE A DAY      AFTER  RESULT OF CORONARY CTA - NEW MEDICATION WILL BE STARTED - CHLORTHALIDONE 25 MG DAILY - WILL SEND TO PHARMACY AFTER RESULTS   SCHEDULE AT [place] Your physician has requested that you have CORONARY CTA. Cardiac computed tomography (CT) is a painless test that uses an x-ray machine to take clear, detailed pictures of your heart. For further information[facility] please visit [email or web address]. Please follow instruction sheet as given.   Your physician recommends that you schedule a follow-up appointment in 2 MONTHS WITH DR HARDING.  If you need a refill on your cardiac medications before your next appointment, please call your pharmacy.        INSTRUCTIONS FOR  CORONARY CTA    [facility] at (30-45 minutes prior to test start time)  [facility] [address] [place], [place] [phone]  Proceed to the [facility] Department (First Floor).  Please follow these instructions carefully (unless otherwise directed):  PLEASE HAVE LABS - BMP  AT LEAST ONE WEEK PRIOR TO TEST  On the Night Before the Test: . Drink plenty of water. . Do not consume any caffeinated/decaffeinated beverages or chocolate 12 hours prior to your test. . Do not take any antihistamines 12 hours prior to your test.    On the Day of the Test: . Drink plenty of water. Do not drink any water within one hour of the test. . Do not eat any food 4 hours prior to the test. . You may take your regular medications prior to the test. .  Take CARDEDILOL MORNING OF THE TEST.   After the Test: . Drink plenty of water. . After receiving IV contrast, you may experience a mild flushed feeling. This is normal. . On occasion, you may experience a mild rash up to 24 hours after the test. This is not dangerous. If this occurs, you can  take Benadryl 25 mg and increase your fluid intake. . If you experience trouble breathing, this can be serious. If it is severe call 911 IMMEDIATELY. If it is mild, please call our office.=

## 2018-08-08 NOTE — Assessment & Plan Note (Signed)
Negative stress test in the past.  Symptoms have been suggestive of microvascular ischemia previously.  At this point now with recurrent chest pain, need to exclude ischemic CAD in order to determine if this is simply muscular skeletal pain from her Adiposa Dolorosa.  Plan: Coronary Calcium Score with Cardiac CT Angiogram -> this will allow us to assess her baseline cardia vascular risk (given her significant family history) as well as document any existing CAD that may not be flow-limiting.  If there is a flow-limiting lesion, we can evaluate with CT FFR.

## 2018-08-08 NOTE — Progress Notes (Signed)
PCP: Cheron Schaumann., MD  Clinic Note: Chief Complaint  Patient presents with  . Establish Care    pt reports episodes of CP and ShOBOE x months    HPI: Melinda Krause is a 57 y.o. female who is being seen today for the evaluation of Chest Pain & Shortness of Breath on Exertion at the request of Doreatha Martin Y.,*/ [employer], PA-C.   Melinda Krause has a notable for hypertension, hyperlipidemia, morbid obesity, and hyperglycemia all of which make up criteria for metabolic syndrome.  She has chronic lower extremity edema which has been evaluated in the past and thought to be more related to lymphedema/lipidema.  It was not thought to be vascular or necessarily related to heart failure.  She was told to make her Lasix as needed.  She was also told to stop amlodipine by her rheumatologist because of concerns for exacerbation of edema.  She was told to reduce her clonidine because of concern for dry mouth.  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  Recent Hospitalizations: n/a   Studies Personally Reviewed - (if available, images/films reviewed: From Epic Chart or Care Everywhere)  Echo 06/2017 [facility]): Normal left ventricular size and systolic function with no RWMA.  Ef 55-60%. Mild MR. Echo & Myoview in 2015 - no ischemia. Normal LVEF; EF 55-60%, no RWMA.  Normal   Interval History: Melinda Krause presents here roughly 4 years after she had previously been seen for atypical sounding chest pain.  She had a negative stress test at that time with low normal echo.  She also recently was evaluated with a 2D echocardiogram last August that was also normal. She is here really with complaints of progressively worsening exercise related chest tightness and dyspnea.  Apparently she  was evaluated and had an elevated BNP level.  However she had been told (by vascular surgery and rheumatology) to use her Lasix as needed and has not really been using her Zaroxolyn either. She tells me that she is gained about 60 pounds in the last year (16 pounds since March). In addition to having significant exertional dyspnea, she is having associated chest pain that almost doubles her over.  She can have chest pain simply walking to the bathroom to brush her teeth.  She describes it as a heavy pressure squeezing sensation in her chest that is usually exacerbated by activity. SNM sample, of simply walking from her bedroom to the bathroom she has to stop to catch her breath and if she does not she will have this chest discomfort.  Then when she gets over the dyspnea she will develop a headache.  In addition to her exertional dyspnea with chest pain, she just notes profound fatigue and generalized weakness.  She additionally has had chronic bilateral edema and has been told that this was mild probably not due to venous stasis but was related to lymphedema.  Despite this she sleeps on 4 pillows and has been doing so for several years more because of musculoskeletal issues.  She is not overly compliant with her CPAP.  She does probably have some orthopnea but no real PND symptoms.  She does feel her heart racing on occasion off and on, but it is usually associated with her dyspnea.  She has intermittent episodes of lightheadedness and  dizziness but no syncope or near syncope.  No TIA or amaurosis fugax.  No melena, hematochezia hematuria epistaxis.  She does not walk enough to notice any claudication.  She is not sure if the pain in her chest is related to substernal lipomata or if this is cardiac.  She does have some lipoma along her rib cage which are quite painful.  ROS: A comprehensive was performed. Review of Systems  Constitutional: Positive for malaise/fatigue. Negative for weight loss (She  claims 60 pound weight gain in the last year.  ).  HENT: Negative for congestion, hearing loss and nosebleeds.   Respiratory: Positive for shortness of breath. Negative for cough.   Gastrointestinal: Positive for abdominal pain (Epigastric) and heartburn.  Genitourinary: Negative for dysuria and hematuria.  Musculoskeletal: Positive for back pain, joint pain and myalgias.  Neurological: Positive for dizziness, focal weakness and headaches.  Endo/Heme/Allergies: Does not bruise/bleed easily.  Psychiatric/Behavioral: Positive for depression. The patient is nervous/anxious and has insomnia.   All other systems reviewed and are negative.    I have reviewed and (if needed) personally updated the patient's problem list, medications, allergies, past medical and surgical history, social and family history.   Past Medical History:  Diagnosis Date  . 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 Depression    Counseling-Dr  [facility]  . Fibromyalgia    Rheumatologist- Dr. Ardis Hughs -- new Dx of Adiposo Dolorosa.  Marland Kitchen GERD (gastroesophageal reflux disease)   . Headache(784.0)    daily  . History of chicken pox    childhood  . Hyperlipidemia   . Hypertension   . Hypothyroidism   . Lymphedema of lower extremity    with associated Lipidema.  . Migraine    1-2 per month  . Morbid obesity with BMI of 45.0-49.9, adult (HCC)   . Neck injury    C-5-6  . Non compliance with medical treatment 04/02/2014  . OSA on CPAP    However, she does not routinely use her CPAP every day.  . Pain in joint, shoulder region 04/02/2013   left  . Peptic ulcer   . Peripheral neuropathy   . Seasonal allergies     Past Surgical History:  Procedure Laterality Date  . ABDOMINAL HYSTERECTOMY  2003  . CESAREAN SECTION  1989  . COLONOSCOPY    . NM MYOVIEW LTD  04/18/2014   LexiScan: EF 60%; no evidence of ischemia or infarction. Normal stress test to  . RECTOCELE  REPAIR  2009  . removal of vaginal mesh  11/2012  . TRANSTHORACIC ECHOCARDIOGRAM  04/18/2014   EF 55-60%. Difficult to assess for regional wall motion due to poor quality.  Normal diastolic pressures/function. No significant valvular lesions.  . TRANSTHORACIC ECHOCARDIOGRAM  06/2017   Normal left ventricular size and systolic function with no RWMA.  Ef 55-60%. Mild MR.  Marland Kitchen UPPER GASTROINTESTINAL ENDOSCOPY    . VAGINA RECONSTRUCTION SURGERY  2011   vaginal mesh repair  . VAGINAL HYSTERECTOMY  2003    Current Meds  Medication Sig  . acetaminophen (TYLENOL) 500 MG tablet Take 1,000 mg by mouth daily as needed.   . Alpha-Lipoic Acid 600 MG CAPS Take 1 capsule by mouth daily.  . Cholecalciferol (VITAMIN D3) 5000 units CAPS Take 1 capsule by mouth daily.  . cloNIDine (CATAPRES) 0.3 MG tablet Take 0.5 tablet by mouth three times daily.  . cyclobenzaprine (FLEXERIL) 5 MG tablet TAKE 1 TABLET BY MOUTH 3 TIMES DAILY AS  NEEDED FOR MUSCLE SPASM.  Marland Kitchen Diosmin POWD Take 1,000 mg by mouth daily.  . fenofibrate 160 MG tablet Take 160 mg by mouth daily.  Marland Kitchen lamoTRIgine (LAMICTAL) 200 MG tablet Take 1 tablet (200 mg total) by mouth 2 (two) times daily.  Marland Kitchen levothyroxine (SYNTHROID, LEVOTHROID) 175 MCG tablet Take 175 mcg by mouth daily before breakfast.  . loratadine (CLARITIN) 10 MG tablet Take 10 mg by mouth daily as needed for allergies.  Marland Kitchen losartan (COZAAR) 100 MG tablet Take 1 tablet (100 mg total) by mouth daily.  . magnesium oxide (MAG-OX) 400 MG tablet Take 400 mg by mouth daily.  . Melatonin 5 MG TABS Take 1 tablet by mouth at bedtime.  . Multiple Vitamins-Minerals (MULTIVITAMIN PO) Take 1 tablet by mouth daily.  . naproxen sodium (ANAPROX) 220 MG tablet Take 220 mg by mouth 2 (two) times daily with a meal.  . omeprazole (PRILOSEC) 40 MG capsule TAKE 1 CAPSULE (40 MG TOTAL) BY MOUTH DAILY.  . rosuvastatin (CRESTOR) 5 MG tablet Take 5 mg by mouth daily.  . TURMERIC PO Take 1 tablet by mouth daily.  Marland Kitchen  venlafaxine XR (EFFEXOR-XR) 150 MG 24 hr capsule Take 300 mg by mouth daily with breakfast.  . zolpidem (AMBIEN) 5 MG tablet Take 1 tablet (5 mg total) by mouth at bedtime as needed for sleep.  . [DISCONTINUED] metoprolol succinate (TOPROL-XL) 100 MG 24 hr tablet TAKE 2 TABLETS BY MOUTH EVERY DAY, TAKE WITH OR IMMEDIATELY AFTER A MEAL    Allergies  Allergen Reactions  . Demerol     Irritability, and shortness of breath    Social History   Tobacco Use  . Smoking status: Never Smoker  . Smokeless tobacco: Never Used  Substance Use Topics  . Alcohol use: No  . Drug use: No   Social History   Social History Narrative   Patient is married Melinda Krause) and lives at home with her husband. Three children.   Patient has a Acupuncturist.  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  (Is concerned about lower salary range of benefits)..   Patient is right-handed.   Patient drinks 2-3 Diet sodas daily.   Does not Drink EtOH or smoke      Both her mother and father have died within the past year or so.    family history includes Allergies in her mother; Arrhythmia in her son; Arthritis in her father and sister; Asthma in her mother; Breast cancer (age of onset: 72) in her mother; Breast cancer (age of onset: 35) in her maternal grandmother; CAD in her father, maternal grandfather, and mother; Congenital heart disease in her father; Congestive Heart Failure in her father; Coronary artery disease in her brother; Emphysema in her mother; Gout in her father; Heart attack in her paternal grandfather; Heart attack (age of onset: 47) in her brother; Heart attack (age of onset: 59) in her maternal grandfather; Heart disease in her paternal grandmother; Heart failure in her mother; Hypertension in her brother, father, mother, paternal grandmother, sister, and son; Hypothyroidism in her brother, brother, father, sister, and sister; Rheum arthritis in her maternal  grandfather; Stroke in her paternal grandmother; Stroke (age of onset: 84) in her mother; Thyroid disease in her mother.  Wt Readings from Last 3 Encounters:  08/08/18 (!) 326 lb 6.4 oz (148.1 kg)  07/27/18 (!) 331 lb 8 oz (150.4 kg)  06/22/17 286 lb 8 oz (130 kg)    PHYSICAL  EXAM BP (!) 152/80   Pulse 73   Ht 5\' 4"  (1.626 m)   Wt (!) 326 lb 6.4 oz (148.1 kg)   BMI 56.03 kg/m  Physical Exam  Constitutional: She is oriented to person, place, and time. No distress.  Well-groomed.  Morbidly obese.  HENT:  Head: Normocephalic and atraumatic.  Mouth/Throat: No oropharyngeal exudate.  Eyes: Pupils are equal, round, and reactive to light. Conjunctivae and EOM are normal.  Mild exophthalmos  Neck: Normal range of motion. Neck supple. No hepatojugular reflux and no JVD present. Carotid bruit is not present. No thyromegaly present.  Small palpable lipomas noted on the right chain.  Pulmonary/Chest: Effort normal and breath sounds normal. No respiratory distress. She has no wheezes. She has no rales. She exhibits tenderness (Significant chest wall tenderness to palpation, especially along the sternum.).  Abdominal: Soft. Bowel sounds are normal. She exhibits no distension. There is tenderness (Localized areas with lipomas). There is no rebound.  Unable to assess HSM due to obesity  Musculoskeletal: She exhibits edema (1-2+ pitting edema up to the thighs bilaterally.).  Mildly reduced range of motion.  Slow steady gait.  Neurological: She is alert and oriented to person, place, and time. No cranial nerve deficit.  Skin: Skin is warm and dry. She is not diaphoretic.  Psychiatric: She has a normal mood and affect. Her behavior is normal. Judgment and thought content normal.  Seems quite anxious.  Vitals reviewed.     Adult ECG Report  Rate: 73;  Rhythm: normal sinus rhythm and RBBB.  Otherwise normal axis, intervals and durations.  Normal voltage;   Narrative Interpretation: Relatively  normal EKG besides RBBB.   Other studies Reviewed: Additional studies/ records that were reviewed today include:  Recent Labs: Clinic visit 06/17/2018: [facility]  A1c 5.9.  CBC: W4.3, H/H 11.0/33.6, platelet 335.  D-dimer<270 (below cut off)  Sodium 135, potassium 4.4, chloride 97, bicarb 31.  BUN/creatinine 19/1.11.  Glucose 24.  Calcium 9.4.  (GFR 56)    ASSESSMENT / PLAN: Problem List Items Addressed This Visit    Accelerated hypertension (Chronic)    Remains hypertensive on current management.  Now that she is no longer on amlodipine and is on reduced dose of clonidine, I do not think that the Toprol is doing an adequate job along with losartan.  Plan: Convert from Toprol 200 mg daily to carvedilol 25 mg twice daily.  Start chlorthalidone 25 mg daily once coronary CTA is done and labs been confirmed for adequate renal function and electrolytes.  Will hopefully continue to try to wean clonidine in view of potentially adding hydralazine.      Relevant Medications   cloNIDine (CATAPRES) 0.3 MG tablet   fenofibrate 160 MG tablet   rosuvastatin (CRESTOR) 5 MG tablet   furosemide (LASIX) 40 MG tablet   amLODipine (NORVASC) 5 MG tablet   Other Relevant Orders   EKG 12-Lead   CT CORONARY MORPH W/CTA COR W/SCORE W/CA W/CM &/OR WO/CM   CT CORONARY FRACTIONAL FLOW RESERVE DATA PREP   CT CORONARY FRACTIONAL FLOW RESERVE FLUID ANALYSIS   Chest pain with moderate risk for cardiac etiology - Primary (Chronic)    Negative stress test in the past.  Symptoms have been suggestive of microvascular ischemia previously.  At this point now with recurrent chest pain, need to exclude ischemic CAD in order to determine if this is simply muscular skeletal pain from her Adiposa Dolorosa.  Plan: Coronary Calcium Score with Cardiac  CT Angiogram -> this will allow [place] to assess her baseline cardia vascular risk (given her significant family history) as well as document any existing CAD  that may not be flow-limiting.  If there is a flow-limiting lesion, we can evaluate with CT FFR.      Relevant Orders   EKG 12-Lead   CT CORONARY MORPH W/CTA COR W/SCORE W/CA W/CM &/OR WO/CM   CT CORONARY FRACTIONAL FLOW RESERVE DATA PREP   CT CORONARY FRACTIONAL FLOW RESERVE FLUID ANALYSIS   Basic metabolic panel   Dyspnea on exertion (Chronic)    She has multifactorial reasons for having exertional dyspnea mostly related to deconditioning, sedentary lifestyle, morbid obesity and likely diastolic dysfunction from hypertension (hypertensive heart disease) as well as potential complications of OSA.  With chest pain associated with it, need to exclude ischemia: She would not be a good candidate for Myoview stress test.  Therefore we are going to check A Coronary Calcium Score with Cardiac CT Angiogram      Relevant Orders   EKG 12-Lead   CT CORONARY MORPH W/CTA COR W/SCORE W/CA W/CM &/OR WO/CM   CT CORONARY FRACTIONAL FLOW RESERVE DATA PREP   CT CORONARY FRACTIONAL FLOW RESERVE FLUID ANALYSIS   Hyperglycemia   Hyperlipidemia (Chronic)    I do not have any recent labs for her.  Currently, she is only on fenofibrate and low-dose Crestor.  Pending results of her coronary calcium score, may need to be more aggressive.  Would like to see if we can get most recent lipid panel from PCP.      Relevant Medications   cloNIDine (CATAPRES) 0.3 MG tablet   fenofibrate 160 MG tablet   rosuvastatin (CRESTOR) 5 MG tablet   furosemide (LASIX) 40 MG tablet   amLODipine (NORVASC) 5 MG tablet   Lower extremity edema    Chronic finding.  Was told by vascular surgery that this is probably not cardiovascular in nature.  It certainly could be related to OSA, but more consistent with lymphedema.  I think that support stockings are probably a better option than diuretic, but PRN diuretic is not unreasonable.  Need to ensure that her electrolytes are stable. I am adding chlorthalidone for blood pressure which  will hopefully help with some diuresis.      Relevant Orders   EKG 12-Lead   CT CORONARY MORPH W/CTA COR W/SCORE W/CA W/CM &/OR WO/CM   CT CORONARY FRACTIONAL FLOW RESERVE DATA PREP   CT CORONARY FRACTIONAL FLOW RESERVE FLUID ANALYSIS   Metabolic syndrome (Chronic)    Obesity, hypertension, dyslipidemia and hyperglycemia may Features of metabolic syndrome.  This puts her at high risk for coronary disease, especially in light of her family history. Since she is having such significant exertional dyspnea, we need to exclude ischemic CAD.    Pending results of coronary CT angiogram, we can determine how aggressive management of her risk factors becomes.      Morbid obesity with BMI of 45.0-49.9, adult (HCC) (Chronic)    Clearly, this is a compounding feature for her exertional dyspnea and even potentially hypertension.  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  This is also contributing to her cholesterol and blood pressure.  Discussed importance of weight loss.  She is previously been referred to a nutritionist.  This makes a more major component of metabolic syndrome.      Relevant Medications   magnesium oxide (MAG-OX) 400 MG tablet   OSA (obstructive sleep  apnea) (Chronic)    Not initially compliant with her CPAP per PCP note.  Certainly this could be contaminant to her hypertension and dyspnea.  There also may be some component of obesity hypoventilation syndrome.        I spent a total of ~45 minutes with the patient and chart review. >  50% of the time was spent in direct patient consultation.  She had multiple different explanations of her symptoms as well as explanation of her significant past medical history.  Several adjustments to medications that were made since the referral note provided.   In addition to the time spent in direct patient contact, I spent an additional 35 minutes reviewing her referral paperwork with past medical  history, family history, labs in addition to reviewing old Epic notes as well as Care Everywhere.  Clinic notes, cardiovascular studies etc. reviewed.  Current medicines are reviewed at length with the patient today.  (+/- concerns) multiple changes to her medications were reviewed. The following changes have been made:  See below  Patient Instructions  MEDICATION INSTRUCTIONS--   STOP METOPROLOL  START CARVEDILOL 12.5 MG TWICE A DAY     AFTER  RESULT OF CORONARY CTA - NEW MEDICATION WILL BE STARTED - CHLORTHALIDONE 25 MG DAILY - WILL SEND TO PHARMACY AFTER RESULTS  Your physician has requested that you have CORONARY CTA.   Your physician recommends that you schedule a follow-up appointment in 2 MONTHS WITH DR Melinda Krause.  If you need a refill on your cardiac medications before your next appointment, please call your pharmacy.  Studies Ordered:   Orders Placed This Encounter  Procedures  . CT CORONARY MORPH W/CTA COR W/SCORE W/CA W/CM &/OR WO/CM  . CT CORONARY FRACTIONAL FLOW RESERVE DATA PREP  . CT CORONARY FRACTIONAL FLOW RESERVE FLUID ANALYSIS  . Basic metabolic panel  . EKG 12-Lead      Bryan Lemma, M.D., M.S. Interventional Cardiologist   Pager # [phone] Phone # [phone] [address] [place], [place] [address]   Thank you for choosing [facility] at [facility]!!

## 2018-08-08 NOTE — Assessment & Plan Note (Signed)
Obesity, hypertension, dyslipidemia and hyperglycemia may Features of metabolic syndrome.  This puts her at high risk for coronary disease, especially in light of her family history. Since she is having such significant exertional dyspnea, we need to exclude ischemic CAD.    Pending results of coronary CT angiogram, we can determine how aggressive management of her risk factors becomes.

## 2018-08-08 NOTE — Assessment & Plan Note (Signed)
She has multifactorial reasons for having exertional dyspnea mostly related to deconditioning, sedentary lifestyle, morbid obesity and likely diastolic dysfunction from hypertension (hypertensive heart disease) as well as potential complications of OSA.  With chest pain associated with it, need to exclude ischemia: She would not be a good candidate for Myoview stress test.  Therefore we are going to check A Coronary Calcium Score with Cardiac CT Angiogram

## 2018-08-08 NOTE — Assessment & Plan Note (Signed)
Chronic finding.  Was told by vascular surgery that this is probably not cardiovascular in nature.  It certainly could be related to OSA, but more consistent with lymphedema.  I think that support stockings are probably a better option than diuretic, but PRN diuretic is not unreasonable.  Need to ensure that her electrolytes are stable. I am adding chlorthalidone for blood pressure which will hopefully help with some diuresis.

## 2018-08-08 NOTE — Assessment & Plan Note (Signed)
I do not have any recent labs for her.  Currently, she is only on fenofibrate and low-dose Crestor.  Pending results of her coronary calcium score, may need to be more aggressive.  Would like to see if we can get most recent lipid panel from PCP.

## 2018-08-08 NOTE — Assessment & Plan Note (Signed)
Not initially compliant with her CPAP per PCP note.  Certainly this could be contaminant to her hypertension and dyspnea.  There also may be some component of obesity hypoventilation syndrome.

## 2018-08-08 NOTE — Assessment & Plan Note (Signed)
Remains hypertensive on current management.  Now that she is no longer on amlodipine and is on reduced dose of clonidine, I do not think that the Toprol is doing an adequate job along with losartan.  Plan: Convert from Toprol 200 mg daily to carvedilol 25 mg twice daily.  Start chlorthalidone 25 mg daily once coronary CTA is done and labs been confirmed for adequate renal function and electrolytes.  Will hopefully continue to try to wean clonidine in view of potentially adding hydralazine.

## 2018-08-10 ENCOUNTER — Telehealth: Payer: Self-pay | Admitting: Cardiology

## 2018-08-10 MED ORDER — CARVEDILOL 12.5 MG PO TABS: 12.5000 mg | ORAL_TABLET | Freq: Two times a day (BID) | ORAL | 3 refills | Status: DC

## 2018-08-10 NOTE — Telephone Encounter (Signed)
New Message:        Pt is calling and states a new BP pill was supposed to be sent for her and she was to stop the metoprolol. Pt states it is not at the pharmacy

## 2018-08-10 NOTE — Telephone Encounter (Signed)
Patient reports carvedilol script wasn't sent to the pharmacy. Sent this medication in a advised patient this is 12.5 mg to be taken twice daily. Patient also advised to stop Metoprolol. Patient verbally understands and medication sent to pharmacy.

## 2018-08-14 ENCOUNTER — Telehealth: Payer: Self-pay | Admitting: Cardiology

## 2018-08-14 NOTE — Telephone Encounter (Signed)
Pt called in reporting her blood pressures have not been well controlled over the past couple of days. Has had readings in the 200s systolic. Medication regimen was recently changed by Dr. Herbie BaltimoreHarding. Currently on losartan 100mg  daily and coreg 12.5mg  BID. Blood pressures this morning are in the 200s again, but no symptoms. Advised that she increase the coreg to 25mg  BID as her HR is running the 80s and continue to follow her blood pressures. Advised to call back if no significant improvement. She voiced understanding.   Laverda PageLindsay Roberts NP

## 2018-08-25 ENCOUNTER — Telehealth: Payer: Self-pay | Admitting: Cardiology

## 2018-08-25 NOTE — Telephone Encounter (Signed)
Received call from patient.She stated sob is worse.Stated hard to walk from room to room.No chest pain. Advised to go to ED.

## 2018-08-25 NOTE — Telephone Encounter (Signed)
New Message   Pt c/o Shortness Of Breath: STAT if SOB developed within the last 24 hours or pt is noticeably SOB on the phone  1. Are you currently SOB (can you hear that pt is SOB on the phone)? A little now, I can hear it slightly when speaking with patient  2. How long have you been experiencing SOB? Progress over the past couple of weeks  3. Are you SOB when sitting or when up moving around? Sitting and moving   4. Are you currently experiencing any other symptoms? None   Patient did indicate that she has SOB when she walks and she barely go far.

## 2018-08-25 NOTE — Telephone Encounter (Signed)
She has Coronary CTA ordered - this will help tell us if we need to be concerned about heart artery disease.  Agreed that if Sx are soo bad, that she needs to go to ER for urgent evaluation.  Bryan Lemma, MD

## 2018-09-06 ENCOUNTER — Other Ambulatory Visit: Payer: Self-pay

## 2018-09-06 ENCOUNTER — Emergency Department (HOSPITAL_COMMUNITY): Payer: BLUE CROSS/BLUE SHIELD

## 2018-09-06 ENCOUNTER — Emergency Department (HOSPITAL_COMMUNITY)
Admission: EM | Admit: 2018-09-06 | Discharge: 2018-09-06 | Discharge status: Against Medical Advice | Payer: BLUE CROSS/BLUE SHIELD | Attending: Emergency Medicine | Admitting: Emergency Medicine

## 2018-09-06 ENCOUNTER — Encounter (HOSPITAL_COMMUNITY): Payer: Self-pay | Admitting: Emergency Medicine

## 2018-09-06 DIAGNOSIS — R0602 Shortness of breath: Secondary | ICD-10-CM | POA: Diagnosis not present

## 2018-09-06 DIAGNOSIS — Z5321 Procedure and treatment not carried out due to patient leaving prior to being seen by health care provider: Secondary | ICD-10-CM | POA: Diagnosis not present

## 2018-09-06 LAB — CBC
HCT: 32.9 % — ABNORMAL LOW (ref 36.0–46.0)
Hemoglobin: 8.7 g/dL — ABNORMAL LOW (ref 12.0–15.0)
MCH: 22.3 pg — ABNORMAL LOW (ref 26.0–34.0)
MCHC: 26.4 g/dL — ABNORMAL LOW (ref 30.0–36.0)
MCV: 84.4 fL (ref 80.0–100.0)
Platelets: 374 10*3/uL (ref 150–400)
RBC: 3.9 MIL/uL (ref 3.87–5.11)
RDW: 14.7 % (ref 11.5–15.5)
WBC: 5.9 10*3/uL (ref 4.0–10.5)
nRBC: 0.3 % — ABNORMAL HIGH (ref 0.0–0.2)

## 2018-09-06 LAB — BASIC METABOLIC PANEL
Anion gap: 11 (ref 5–15)
BUN: 12 mg/dL (ref 6–20)
CO2: 24 mmol/L (ref 22–32)
Calcium: 9.4 mg/dL (ref 8.9–10.3)
Chloride: 104 mmol/L (ref 98–111)
Creatinine, Ser: 0.85 mg/dL (ref 0.44–1.00)
GFR calc Af Amer: >60 mL/min (ref >60)
GFR calc non Af Amer: >60 mL/min (ref >60)
Glucose, Bld: 120 mg/dL — ABNORMAL HIGH (ref 70–99)
Potassium: 3.9 mmol/L (ref 3.5–5.1)
Sodium: 139 mmol/L (ref 135–145)

## 2018-09-06 LAB — I-STAT TROPONIN, ED: Troponin i, poc: 0.01 ng/mL (ref 0.00–0.08)

## 2018-09-06 NOTE — ED Notes (Signed)
Pt advised she has a room upon being called pt decided she wanted to leave so pt left.

## 2018-09-06 NOTE — ED Triage Notes (Signed)
Pt reports worsening SOB x a few months, states she saw Dr. Herbie Baltimore cardiology last month and he r/o cardiac causes but is pending angiogram to r/o pulmonary hypertension. States that today the SOB is worse, and she having a hard time doing normal ADLs without feeling extremely out of breath.

## 2018-09-12 ENCOUNTER — Emergency Department (HOSPITAL_BASED_OUTPATIENT_CLINIC_OR_DEPARTMENT_OTHER): Payer: BLUE CROSS/BLUE SHIELD

## 2018-09-12 ENCOUNTER — Emergency Department (HOSPITAL_COMMUNITY): Payer: BLUE CROSS/BLUE SHIELD

## 2018-09-12 ENCOUNTER — Encounter (HOSPITAL_COMMUNITY): Payer: Self-pay | Admitting: Emergency Medicine

## 2018-09-12 ENCOUNTER — Emergency Department (HOSPITAL_COMMUNITY)
Admission: EM | Admit: 2018-09-12 | Discharge: 2018-09-12 | Disposition: A | Discharge status: Home / Self Care | Payer: BLUE CROSS/BLUE SHIELD | Attending: Emergency Medicine | Admitting: Emergency Medicine

## 2018-09-12 DIAGNOSIS — R946 Abnormal results of thyroid function studies: Secondary | ICD-10-CM | POA: Insufficient documentation

## 2018-09-12 DIAGNOSIS — R600 Localized edema: Secondary | ICD-10-CM | POA: Diagnosis not present

## 2018-09-12 DIAGNOSIS — R52 Pain, unspecified: Secondary | ICD-10-CM | POA: Diagnosis not present

## 2018-09-12 DIAGNOSIS — R0609 Other forms of dyspnea: Secondary | ICD-10-CM | POA: Diagnosis not present

## 2018-09-12 DIAGNOSIS — R7989 Other specified abnormal findings of blood chemistry: Secondary | ICD-10-CM

## 2018-09-12 DIAGNOSIS — I890 Lymphedema, not elsewhere classified: Secondary | ICD-10-CM | POA: Diagnosis not present

## 2018-09-12 DIAGNOSIS — I10 Essential (primary) hypertension: Secondary | ICD-10-CM | POA: Insufficient documentation

## 2018-09-12 DIAGNOSIS — Z79899 Other long term (current) drug therapy: Secondary | ICD-10-CM | POA: Diagnosis not present

## 2018-09-12 DIAGNOSIS — R609 Edema, unspecified: Secondary | ICD-10-CM | POA: Diagnosis not present

## 2018-09-12 DIAGNOSIS — E039 Hypothyroidism, unspecified: Secondary | ICD-10-CM | POA: Insufficient documentation

## 2018-09-12 DIAGNOSIS — R079 Chest pain, unspecified: Secondary | ICD-10-CM | POA: Diagnosis present

## 2018-09-12 DIAGNOSIS — R0602 Shortness of breath: Secondary | ICD-10-CM

## 2018-09-12 LAB — CBC
HCT: 33.2 % — ABNORMAL LOW (ref 36.0–46.0)
Hemoglobin: 9.5 g/dL — ABNORMAL LOW (ref 12.0–15.0)
MCH: 23.3 pg — ABNORMAL LOW (ref 26.0–34.0)
MCHC: 28.6 g/dL — ABNORMAL LOW (ref 30.0–36.0)
MCV: 81.4 fL (ref 80.0–100.0)
Platelets: 388 10*3/uL (ref 150–400)
RBC: 4.08 MIL/uL (ref 3.87–5.11)
RDW: 14.8 % (ref 11.5–15.5)
WBC: 6.1 10*3/uL (ref 4.0–10.5)
nRBC: 0 % (ref 0.0–0.2)

## 2018-09-12 LAB — I-STAT TROPONIN, ED: Troponin i, poc: 0.01 ng/mL (ref 0.00–0.08)

## 2018-09-12 LAB — BASIC METABOLIC PANEL
Anion gap: 10 (ref 5–15)
BUN: 15 mg/dL (ref 6–20)
CO2: 30 mmol/L (ref 22–32)
Calcium: 9.2 mg/dL (ref 8.9–10.3)
Chloride: 96 mmol/L — ABNORMAL LOW (ref 98–111)
Creatinine, Ser: 1.01 mg/dL — ABNORMAL HIGH (ref 0.44–1.00)
GFR calc Af Amer: >60 mL/min (ref >60)
GFR calc non Af Amer: >60 mL/min (ref >60)
Glucose, Bld: 109 mg/dL — ABNORMAL HIGH (ref 70–99)
Potassium: 3.7 mmol/L (ref 3.5–5.1)
Sodium: 136 mmol/L (ref 135–145)

## 2018-09-12 LAB — D-DIMER, QUANTITATIVE: D-Dimer, Quant: 0.72 ug/mL-FEU — ABNORMAL HIGH (ref 0.00–0.50)

## 2018-09-12 LAB — BRAIN NATRIURETIC PEPTIDE: B Natriuretic Peptide: 93.8 pg/mL (ref 0.0–100.0)

## 2018-09-12 MED ORDER — IOPAMIDOL (ISOVUE-370) INJECTION 76%
INTRAVENOUS | Status: AC
  Filled 2018-09-12: qty 100

## 2018-09-12 MED ORDER — IOPAMIDOL (ISOVUE-370) INJECTION 76%
100.0000 mL | Freq: Once | INTRAVENOUS | Status: AC | PRN
  Administered 2018-09-12: 100 mL via INTRAVENOUS

## 2018-09-12 NOTE — ED Notes (Signed)
Patient ambulatory to bathroom with steady gait at this time 

## 2018-09-12 NOTE — ED Notes (Signed)
Pt ambulatory to bathroom

## 2018-09-12 NOTE — Discharge Instructions (Signed)
Please call your cardiologist  Please call your endocrinologist.  Please call your primary care provider

## 2018-09-12 NOTE — ED Provider Notes (Signed)
[facility] EMERGENCY DEPARTMENT Provider Note   CSN: [id number] Arrival date & time: 09/12/18  1327     History   Chief Complaint Chief Complaint  Patient presents with  . Chest Pain    HPI Melinda Krause is a 57 y.o. female.  HPI Patient is a 57 year old female presents the emergency department with ongoing exertional shortness of breath over the past several months.  She has seen cardiology and is scheduled for an outpatient CT coronary study.  Today the shortness of breath was worse with exertion and more so than usual.  She has edema in both of her legs and her diuretic was increased by her primary care physician.  She has diuresed the week and has lost 13 pounds with some improvement in her lower extremity swelling.  No history of DVT or pulmonary embolism.  No pleuritic chest pain.  No chest discomfort at this time.  No shortness of breath at rest.  Denies a history of pulmonary hypertension.  She has recently been told that she was anemic and is getting an outpatient anemia work-up.  I was contacted by her primary care physician's office let me know that her TSH from Friday was greater than 20.  This is abnormal for the patient given her use of Synthroid and usually well-controlled hypothyroidism.  No fevers or chills.  No productive cough.  No symptoms at rest.    Past Medical History:  Diagnosis Date  . 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 Depression    Counseling-Dr [facility]  . Fibromyalgia    Rheumatologist- Dr. Ardis Hughs -- new Dx of Adiposo Dolorosa.  Marland Kitchen GERD (gastroesophageal reflux disease)   . Headache(784.0)    daily  . History of chicken pox    childhood  . Hyperlipidemia   . Hypertension   . Hypothyroidism   . Lymphedema of lower extremity    with associated Lipidema.  . Migraine    1-2 per month  . Morbid obesity with BMI of 45.0-49.9, adult (HCC)   . Neck injury    C-5-6    . Non compliance with medical treatment 04/02/2014  . OSA on CPAP    However, she does not routinely use her CPAP every day.  . Pain in joint, shoulder region 04/02/2013   left  . Peptic ulcer   . Peripheral neuropathy   . Seasonal allergies     Patient Active Problem List   Diagnosis Date Noted  . Metabolic syndrome 08/08/2018  . Accelerated hypertension 08/08/2018  . Lower extremity edema 08/08/2018  . Morbid obesity (HCC) 08/08/2018  . Dysesthesia 06/22/2017  . Neuropathy 12/23/2016  . Bruxism 11/12/2014  . Essential hypertension 09/12/2014  . Dyspnea on exertion 04/03/2014  . Chest pain with moderate risk for cardiac etiology 04/03/2014  . Hyperlipidemia 04/03/2014  . Morbid obesity with BMI of 45.0-49.9, adult (HCC)   . Insomnia w/ sleep apnea 04/02/2014  . Other malaise and fatigue 04/02/2014  . Non compliance with medical treatment 04/02/2014  . Adult hypothyroidism 01/30/2014  . Mixed incontinence 09/25/2013  . Generalized anxiety disorder 08/08/2013  . Esophageal reflux 04/02/2013  . Headache(784.0) 02/14/2013  . OSA (obstructive sleep apnea) 12/27/2012  . Trigger finger of both hands 03/02/2012  . Hypokalemia 09/26/2011  . Depression 07/17/2011  . Hyperglycemia 07/17/2011  . Insomnia 07/17/2011  . Fatty liver 07/17/2011  . Migraine 07/17/2011  . Fibromyalgia 07/17/2011    Past Surgical History:  Procedure  Laterality Date  . ABDOMINAL HYSTERECTOMY  2003  . CESAREAN SECTION  1989  . COLONOSCOPY    . NM MYOVIEW LTD  04/18/2014   LexiScan: EF 60%; no evidence of ischemia or infarction. Normal stress test to  . RECTOCELE REPAIR  2009  . removal of vaginal mesh  11/2012  . TRANSTHORACIC ECHOCARDIOGRAM  04/18/2014   EF 55-60%. Difficult to assess for regional wall motion due to poor quality.  Normal diastolic pressures/function. No significant valvular lesions.  . TRANSTHORACIC ECHOCARDIOGRAM  06/2017   Normal left ventricular size and systolic function with  no RWMA.  Ef 55-60%. Mild MR.  Marland Kitchen UPPER GASTROINTESTINAL ENDOSCOPY    . VAGINA RECONSTRUCTION SURGERY  2011   vaginal mesh repair  . VAGINAL HYSTERECTOMY  2003     OB History   None      Home Medications    Prior to Admission medications   Medication Sig Start Date End Date Taking? Authorizing Provider  acetaminophen (TYLENOL) 500 MG tablet Take 1,000 mg by mouth daily as needed for mild pain.    Yes [provider]  albuterol (PROVENTIL HFA;VENTOLIN HFA) 108 (90 Base) MCG/ACT inhaler Inhale 2 puffs into the lungs every 6 (six) hours as needed for wheezing or shortness of breath.  04/19/18  Yes [provider]  Alpha-Lipoic Acid 600 MG CAPS Take 600 mg by mouth daily.    Yes [provider]  carvedilol (COREG) 12.5 MG tablet Take 1 tablet (12.5 mg total) by mouth 2 (two) times daily. Patient taking differently: Take 25 mg by mouth 2 (two) times daily.  08/10/18 11/08/18 Yes Marykay Lex, MD  Cholecalciferol (VITAMIN D3) 5000 units CAPS Take 5,000 Units by mouth daily.    Yes [provider]  cyclobenzaprine (FLEXERIL) 5 MG tablet TAKE 1 TABLET BY MOUTH 3 TIMES DAILY AS NEEDED FOR MUSCLE SPASM. Patient taking differently: Take 5 mg by mouth 3 (three) times daily as needed for muscle spasms.  03/04/15  Yes Sandford Craze, NP  Diosmin POWD Take 1,000 mg by mouth daily.   Yes [provider]  fenofibrate 160 MG tablet Take 160 mg by mouth daily.   Yes [provider]  furosemide (LASIX) 40 MG tablet Take 80 mg by mouth daily as needed for fluid or edema.   Yes [provider]  lamoTRIgine (LAMICTAL) 200 MG tablet Take 1 tablet (200 mg total) by mouth 2 (two) times daily. 07/27/18  Yes Sater, Pearletha Furl, MD  levothyroxine (SYNTHROID, LEVOTHROID) 175 MCG tablet Take 175 mcg by mouth daily before breakfast.   Yes [provider]  loperamide (IMODIUM) 2 MG capsule Take 2 mg by mouth as needed for diarrhea or loose stools.    Yes [provider]  loratadine (CLARITIN) 10 MG tablet Take 10 mg by mouth daily as needed for allergies.   Yes [provider]  losartan (COZAAR) 100 MG tablet Take 1 tablet (100 mg total) by mouth daily. 05/08/14  Yes Marykay Lex, MD  magnesium oxide (MAG-OX) 400 MG tablet Take 400 mg by mouth daily.   Yes [provider]  Melatonin 5 MG TABS Take 5 mg by mouth at bedtime.    Yes [provider]  Multiple Vitamins-Minerals (MULTIVITAMIN PO) Take 1 tablet by mouth daily.   Yes [provider]  omeprazole (PRILOSEC) 40 MG capsule TAKE 1 CAPSULE (40 MG TOTAL) BY MOUTH DAILY. Patient taking differently: Take 40 mg by mouth daily.  03/22/15  Yes  Sandford Craze, NP  potassium chloride (K-DUR,KLOR-CON) 10 MEQ tablet Take 10 mEq by mouth 3 (three) times daily.    Yes [provider]  rosuvastatin (CRESTOR) 5 MG tablet Take 5 mg by mouth daily.   Yes [provider]  TURMERIC PO Take 1 tablet by mouth daily.   Yes [provider]  venlafaxine XR (EFFEXOR-XR) 150 MG 24 hr capsule Take 300 mg by mouth daily with breakfast.   Yes [provider]  zolpidem (AMBIEN) 5 MG tablet Take 1 tablet (5 mg total) by mouth at bedtime as needed for sleep. 07/27/18  Yes Sater, Pearletha Furl, MD    Family History Family History  Problem Relation Age of Onset  . Emphysema Mother   . Allergies Mother   . Asthma Mother   . Hypertension Mother   . Heart failure Mother   . Breast cancer Mother 32  . CAD Mother   . Stroke Mother 27  . Thyroid disease Mother   . Hypertension Father   . Arthritis Father   . Congestive Heart Failure Father        Died from complications at age 65  . Congenital heart disease Father   . CAD Father   . Gout Father   . Hypothyroidism Father   . Rheum arthritis Maternal Grandfather   . CAD Maternal Grandfather   . Heart attack Maternal Grandfather 23       Sudden cardiac death  . Breast cancer  Maternal Grandmother 39  . Hypertension Paternal Grandmother   . Heart disease Paternal Grandmother   . Stroke Paternal Grandmother   . Heart attack Paternal Grandfather        Sudden cardiac death  . Hypertension Son   . Arrhythmia Son   . Arthritis Sister   . Hypothyroidism Sister   . Hypertension Sister   . Coronary artery disease Brother   . Hypothyroidism Brother   . Heart attack Brother 47       Died from heart attack, sudden cardiac death  . Hypothyroidism Sister   . Hypothyroidism Brother   . Hypertension Brother     Social History Social History   Tobacco Use  . Smoking status: Never Smoker  . Smokeless tobacco: Never Used  Substance Use Topics  . Alcohol use: No  . Drug use: No     Allergies   Demerol   Review of Systems Review of Systems  All other systems reviewed and are negative.    Physical Exam Updated Vital Signs BP (!) 139/49   Pulse 71   Temp 98.3 F (36.8 C) (Oral)   Resp 17   Ht 5\' 4"  (1.626 m)   Wt (!) 149.7 kg   SpO2 99%   BMI 56.64 kg/m   Physical Exam  Constitutional: She is oriented to person, place, and time. She appears well-developed and well-nourished. No distress.  HENT:  Head: Normocephalic and atraumatic.  Eyes: EOM are normal.  Neck: Normal range of motion.  Cardiovascular: Normal rate, regular rhythm and normal heart sounds.  Pulmonary/Chest: Effort normal and breath sounds normal.  Abdominal: Soft. She exhibits no distension. There is no tenderness.  Musculoskeletal: Normal range of motion. She exhibits edema.  Neurological: She is alert and oriented to person, place, and time.  Skin: Skin is warm and dry.  Psychiatric: She has a normal mood and affect. Judgment normal.  Nursing note and vitals reviewed.    ED Treatments / Results  Labs (all labs ordered are  listed, but only abnormal results are displayed) Labs Reviewed  CBC - Abnormal; Notable for the following components:      Result Value   Hemoglobin  9.5 (*)    HCT 33.2 (*)    MCH 23.3 (*)    MCHC 28.6 (*)    All other components within normal limits  BASIC METABOLIC PANEL  BRAIN NATRIURETIC PEPTIDE  D-DIMER, QUANTITATIVE (NOT AT [facility])  I-STAT TROPONIN, ED    TSH  27.190 (H) -- 09/09/2018    EKG EKG Interpretation  Date/Time:  Monday September 12 2018 13:43:52 EDT Ventricular Rate:  71 PR Interval:    QRS Duration: 144 QT Interval:  449 QTC Calculation: 488 R Axis:   136 Text Interpretation:  Sinus rhythm Right bundle branch block Probable lateral infarct, old No significant change was found Confirmed by Azalia Bilis ([id number]) on 09/12/2018 2:32:01 PM   Radiology No results found.  Procedures Procedures (including critical care time)  Medications Ordered in ED Medications - No data to display   Initial Impression / Assessment and Plan / ED Course  I have reviewed the triage vital signs and the nursing notes.  Pertinent labs & imaging results that were available during my care of the patient were reviewed by me and considered in my medical decision making (see chart for details).     Work-up in the emergency department demonstrates an EKG without ischemic changes.  Troponin is negative.  D-dimer is elevated but CT angiogram of the chest demonstrates no evidence of pulmonary embolism.  Her symptoms have been here for several months.  She is scheduled as an outpatient for additional cardiac work-up.  At this time she is asymptomatic at rest.  I do not think she needs acute hospitalization.  I have contacted her cardiologist via the electronic health record regarding her heart cath/CT coronary angiogram in hopes of expediting this.  Patient encouraged to return to the ER for new or worsening symptoms.  She will need her TSH followed up by her endocrinologist.  Her primary care team is currently undergoing an anemia work-up for the patient as she has been referred to GI.  Her hemoglobin is stable as compared to her hemoglobin  6 days ago.  I do not think she needs acute hospitalization for this.  Final Clinical Impressions(s) / ED Diagnoses   Final diagnoses:  None    ED Discharge Orders    None       Azalia Bilis, MD 09/12/18 1958

## 2018-09-12 NOTE — ED Notes (Signed)
Discharge instructions discussed with Pt. Pt verbalized understanding. Pt stable and ambulatory.    

## 2018-09-12 NOTE — ED Triage Notes (Signed)
Pt here via [facility], reports left sided CP since Friday that is getting worse with SOB.  Peripheral edema present in both legs, pt reports being compliant with her lasix. A&O x4.

## 2018-09-12 NOTE — Progress Notes (Signed)
Bilateral lower extremity venous duplex completed. Technically difficult due to body habitus and edema. There is no obvious evidence of DVT or Baker's cyst. Unable to visualize the peroneal veins bilaterally well enough to evaluate. [name],RVS 09/12/2018 5:09 PM

## 2018-09-12 NOTE — ED Notes (Signed)
Patient transported to CT 

## 2018-09-12 NOTE — ED Notes (Signed)
Pt transported to US

## 2018-09-14 ENCOUNTER — Ambulatory Visit (HOSPITAL_COMMUNITY): Payer: BLUE CROSS/BLUE SHIELD

## 2018-09-14 ENCOUNTER — Ambulatory Visit (HOSPITAL_COMMUNITY)
Admission: RE | Admit: 2018-09-14 | Discharge: 2018-09-14 | Disposition: A | Discharge status: Home / Self Care | Payer: BLUE CROSS/BLUE SHIELD | Source: Ambulatory Visit | Attending: Cardiology | Admitting: Cardiology

## 2018-09-14 DIAGNOSIS — R0600 Dyspnea, unspecified: Secondary | ICD-10-CM

## 2018-09-14 DIAGNOSIS — I10 Essential (primary) hypertension: Secondary | ICD-10-CM | POA: Diagnosis not present

## 2018-09-14 DIAGNOSIS — R600 Localized edema: Secondary | ICD-10-CM | POA: Diagnosis not present

## 2018-09-14 DIAGNOSIS — R0609 Other forms of dyspnea: Secondary | ICD-10-CM

## 2018-09-14 DIAGNOSIS — R079 Chest pain, unspecified: Secondary | ICD-10-CM

## 2018-09-14 MED ORDER — NITROGLYCERIN 0.4 MG SL SUBL
0.8000 mg | SUBLINGUAL_TABLET | Freq: Once | SUBLINGUAL | Status: AC
  Administered 2018-09-14: 0.8 mg via SUBLINGUAL
  Filled 2018-09-14: qty 25

## 2018-09-14 MED ORDER — METOPROLOL TARTRATE 5 MG/5ML IV SOLN
5.0000 mg | INTRAVENOUS | Status: DC | PRN
  Administered 2018-09-14 (×4): 5 mg via INTRAVENOUS
  Filled 2018-09-14 (×2): qty 5

## 2018-09-14 MED ORDER — NITROGLYCERIN 0.4 MG SL SUBL
SUBLINGUAL_TABLET | SUBLINGUAL | Status: AC
  Filled 2018-09-14: qty 1

## 2018-09-14 MED ORDER — METOPROLOL TARTRATE 5 MG/5ML IV SOLN
INTRAVENOUS | Status: AC
  Filled 2018-09-14: qty 20

## 2018-09-14 MED ORDER — IOPAMIDOL (ISOVUE-370) INJECTION 76%
100.0000 mL | Freq: Once | INTRAVENOUS | Status: AC | PRN
  Administered 2018-09-14: 100 mL via INTRAVENOUS

## 2018-09-14 NOTE — Progress Notes (Signed)
CT complete. Patient voices no complaints. Patient given water as requested.

## 2018-09-15 ENCOUNTER — Telehealth: Payer: Self-pay | Admitting: *Deleted

## 2018-09-15 NOTE — Telephone Encounter (Signed)
LEFT DETAILED RESULT MESSAGE PER DPI , ANY QUESTION MAY CALL BACK.   FOLLOW UP APPOINTMENT SCHEDLUE 09/28/18

## 2018-09-15 NOTE — Telephone Encounter (Signed)
-----   Message from Marykay Lex, MD sent at 09/15/2018  9:24 AM EDT ----- Randie Heinz news on the coronary calcium score and coronary CT angiogram. Coronary calcium score was 0 which is very low risk.  CT angiogram did not show any evidence of any significant plaque.  Minimal plaque noted in one artery but otherwise no plaque noted. Nothing more than may be 10%.  This would pretty much confirm that shortness of breath is not related to heart artery disease.  Bryan Lemma, MD

## 2018-09-28 ENCOUNTER — Ambulatory Visit: Payer: BLUE CROSS/BLUE SHIELD | Admitting: Cardiology

## 2018-10-19 ENCOUNTER — Ambulatory Visit: Payer: Self-pay | Admitting: Cardiology

## 2019-01-25 ENCOUNTER — Other Ambulatory Visit: Payer: Self-pay | Admitting: Neurology

## 2019-01-25 NOTE — Telephone Encounter (Signed)
[facility] Database Verified LR: 12-29-2018 Qty: 30 Pending appointment: 01-26-2019

## 2019-01-26 ENCOUNTER — Ambulatory Visit: Payer: BLUE CROSS/BLUE SHIELD | Admitting: Neurology

## 2019-01-27 ENCOUNTER — Encounter: Payer: Self-pay | Admitting: Neurology

## 2019-04-26 ENCOUNTER — Telehealth: Payer: Self-pay | Admitting: *Deleted

## 2019-04-26 NOTE — Telephone Encounter (Signed)
Pt called back, I took call from phone room. I updated med list, pharmacy, allergies on file for VV tomorrow.

## 2019-04-26 NOTE — Addendum Note (Signed)
Addended by: Hillis Range on: 04/26/2019 02:38 PM   Modules accepted: Orders

## 2019-04-26 NOTE — Telephone Encounter (Signed)
Called, LVM for pt to call today so I can update chart info prior to VV tomorrow with Dr. Epimenio Foot

## 2019-04-27 ENCOUNTER — Ambulatory Visit (INDEPENDENT_AMBULATORY_CARE_PROVIDER_SITE_OTHER): Payer: BC Managed Care – PPO | Admitting: Neurology

## 2019-04-27 ENCOUNTER — Encounter: Payer: Self-pay | Admitting: Neurology

## 2019-04-27 ENCOUNTER — Other Ambulatory Visit: Payer: Self-pay

## 2019-04-27 DIAGNOSIS — G629 Polyneuropathy, unspecified: Secondary | ICD-10-CM

## 2019-04-27 DIAGNOSIS — G4733 Obstructive sleep apnea (adult) (pediatric): Secondary | ICD-10-CM | POA: Diagnosis not present

## 2019-04-27 DIAGNOSIS — R208 Other disturbances of skin sensation: Secondary | ICD-10-CM | POA: Diagnosis not present

## 2019-04-27 DIAGNOSIS — G4700 Insomnia, unspecified: Secondary | ICD-10-CM

## 2019-04-27 NOTE — Progress Notes (Signed)
[facility]  PATIENT: Melinda Krause DOB: 1961/09/14  REFERRING DOCTOR OR PCP:  Sandford Craze SOURCE: patient and EMR  _________________________________   HISTORICAL  CHIEF COMPLAINT:  Chief Complaint  Patient presents with   Peripheral Neuropathy   Other    OSA    HISTORY OF PRESENT ILLNESS:  [name] is a 58 y.o. woman with obstructive sleep apnea and pain in her hands, legs and feet  Update 6/42020: Virtual Visit via Video Note I connected with Madalin F Bazen  on 04/27/19 at  3:30 PM EDT by a video enabled telemedicine application and verified that I am speaking with the correct person.  I discussed the limitations of evaluation and management by telemedicine and the availability of in person appointments. The patient expressed understanding and agreed to proceed.  History of Present Illness: She feels her neuropathic pain is better with Belbuca.  She is still on lamotrigine 200 mg po bid but the combination has helped her more than the lamotrigine alone.  Pain goes up to her calves.   THe soles are constantly numb and the numbness is partial to the mid calves.    She notes mild numbness in her hands.  She changed to a different CPAP mask last year and it is helping her more.   She feels she is sleeping better.   She wakes up twice a night to urinate but falls back asleep.  She uses CPAP > 4 hours most nights.    Ambien has helped her insomnia.    She has lost 30 pounds over the last 6 months.   She exercises some and was going to the pool but that was stopped with COVID19.     Mood is doing better.    She is on Effexor XR 300 mg.      Observations/Objective: She is a well-developed well-nourished woman in no acute distress.  The head is normocephalic and atraumatic.  Sclera are anicteric.  Visible skin appears normal.  The neck has a good range of motion.    She is alert and fully oriented with fluent speech and good  attention, knowledge and memory.  Extraocular muscles are intact.  Facial strength is normal.     She appears to have normal strength in the arms.     Assessment and Plan: OSA (obstructive sleep apnea)  Neuropathy  Dysesthesia  1.  Continue CPAP for OSA. 2.   Continue lamotrigine for neuropathic pain and Ambien for insomnia. 3.   Return to see [place] in 6 months or sooner if there are new or worsening neurologic symptoms.  Follow Up Instructions: I discussed the assessment and treatment plan with the patient. The patient was provided an opportunity to ask questions and all were answered. The patient agreed with the plan and demonstrated an understanding of the instructions.    The patient was advised to call back or seek an in-person evaluation if the symptoms worsen or if the condition fails to improve as anticipated.  I provided 18 minutes of non-face-to-face time during this encounter.  ___________________________________  Update 07/27/2018: She is still noting numbness with a prickling sensation and superimposed stings.    Lamotrigine has helped at 150 mg po bid.  She tolerates it well.    Hands are almost pain free now but the feet are only slightly better.       She has OSA.    She had trouble wearing a mask.   Her pain and stuffed  up sensation to her nose led to problems.  Many masks have been tried.    In 2009 a PSG showed an AHI equals 56.  She was titrated to CPAP +8 cm during the split-night study.  Depression is doing well.  This was a major problem in the past.  She continues on Effexor and tolerates it well.  She no longer sees psychiatry.   From 06/22/2017: Pain/dysesthesia:   At the last visit lamotrigine was started and she feels about 40% better.  The painful numbness with burning and tingling in the hands and feet began about 4 years ago. It has been slightly progressive but actually more stable since starting lamotrigine.   .   Does not have diabetes mellitus. She was  checked for vasculitis and the ANA was negative. CRP was elevated but sedimentation rate was normal.   OSA/insomnia:   She was diagnosed many year ago with OSA (severe with AHI = 52). .   She has CPAP but can't get used to the mask.  FF mask felt  claustophobic and the nasal pillows caused congestion.    Many masks were tried.      She was never able to use it for 4 hours on average a night.    APAP did not help compliance.    She wakes up gasping for air.    She has difficulty falling asleep and staying asleep.   Falling asleep is more difficult due to a lot of thoughts going through her mind.   Insomnia did not improve with benzodiazepine or with Ambien in the past.  Mood:  Mood is doing better the past year.   She had severe depression and did ECT x 6 at [facility].   She was very apathetic and wouldn't leave her home.   She is much better now.   There is also some anxiety which also improved  REVIEW OF SYSTEMS: Constitutional: No fevers, chills, sweats, or change in appetite.  She has insomnia that is worse when she wears her OSA mask.. Eyes: No visual changes, double vision, eye pain Ear, nose and throat: No hearing loss, ear pain, nasal congestion, sore throat Cardiovascular: No chest pain, palpitations Respiratory: No shortness of breath at rest or with exertion.   No wheezes.   OSA on CPAP intermittently GastrointestinaI: No nausea, vomiting, diarrhea, abdominal pain, fecal incontinence Genitourinary: No dysuria, urinary retention or frequency.  No nocturia. Musculoskeletal: Notes some neck pain, back pain Integumentary: No rash, pruritus, skin lesions Neurological: as above Psychiatric: Notes depression and anxiety at this time.  Endocrine: No palpitations, diaphoresis, change in appetite, change in weigh or increased thirst Hematologic/Lymphatic: No anemia, purpura, petechiae. Allergic/Immunologic: No itchy/runny eyes, nasal congestion, recent allergic reactions,  rashes  ALLERGIES: Allergies  Allergen Reactions   Demerol     Irritability, and shortness of breath    HOME MEDICATIONS:  Current Outpatient Medications:    acetaminophen (TYLENOL) 500 MG tablet, Take 1,000 mg by mouth daily as needed for mild pain. , Disp: , Rfl:    albuterol (PROVENTIL HFA;VENTOLIN HFA) 108 (90 Base) MCG/ACT inhaler, Inhale 2 puffs into the lungs every 6 (six) hours as needed for wheezing or shortness of breath. , Disp: , Rfl:    Alpha-Lipoic Acid 600 MG CAPS, Take 600 mg by mouth daily. , Disp: , Rfl:    Buprenorphine HCl (BELBUCA) 150 MCG FILM, Place 1 Dose inside cheek 2 (two) times daily as needed., Disp: , Rfl:    carvedilol (COREG)  12.5 MG tablet, Take 1 tablet (12.5 mg total) by mouth 2 (two) times daily., Disp: 180 tablet, Rfl: 3   Cholecalciferol (VITAMIN D3) 5000 units CAPS, Take 5,000 Units by mouth daily. , Disp: , Rfl:    cyclobenzaprine (FLEXERIL) 5 MG tablet, TAKE 1 TABLET BY MOUTH 3 TIMES DAILY AS NEEDED FOR MUSCLE SPASM. (Patient taking differently: Take 5 mg by mouth 3 (three) times daily as needed for muscle spasms. ), Disp: 60 tablet, Rfl: 0   Diosmin POWD, Take 1,000 mg by mouth daily., Disp: , Rfl:    furosemide (LASIX) 40 MG tablet, Take 80 mg by mouth daily as needed for fluid or edema., Disp: , Rfl:    lamoTRIgine (LAMICTAL) 200 MG tablet, Take 1 tablet (200 mg total) by mouth 2 (two) times daily., Disp: 60 tablet, Rfl: 11   levothyroxine (SYNTHROID, LEVOTHROID) 175 MCG tablet, Take 175 mcg by mouth daily before breakfast., Disp: , Rfl:    loperamide (IMODIUM) 2 MG capsule, Take 2 mg by mouth as needed for diarrhea or loose stools., Disp: , Rfl:    loratadine (CLARITIN) 10 MG tablet, Take 10 mg by mouth daily as needed for allergies., Disp: , Rfl:    losartan (COZAAR) 100 MG tablet, Take 1 tablet (100 mg total) by mouth daily., Disp: 30 tablet, Rfl: 11   magnesium oxide (MAG-OX) 400 MG tablet, Take 400 mg by mouth daily., Disp: ,  Rfl:    Melatonin 5 MG TABS, Take 5 mg by mouth at bedtime. , Disp: , Rfl:    Multiple Vitamins-Minerals (MULTIVITAMIN PO), Take 1 tablet by mouth daily., Disp: , Rfl:    omeprazole (PRILOSEC) 40 MG capsule, TAKE 1 CAPSULE (40 MG TOTAL) BY MOUTH DAILY. (Patient taking differently: Take 40 mg by mouth daily. ), Disp: 30 capsule, Rfl: 0   potassium chloride (K-DUR,KLOR-CON) 10 MEQ tablet, Take 10 mEq by mouth 3 (three) times daily as needed (for fluid and edema). 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 Ardis HughsShali Devashwar -- new Dx of Adiposo Dolorosa.   GERD (gastroesophageal reflux disease)    Headache(784.0)    daily   History of chicken pox    childhood   Hyperlipidemia    Hypertension    Hypothyroidism    Lymphedema of lower extremity    with associated Lipidema.   Migraine    1-2 per month   Morbid obesity with BMI of 45.0-49.9, adult (HCC)    Neck injury    C-5-6   Non compliance with medical treatment 04/02/2014   OSA on CPAP    However, she does not routinely use her CPAP every day.   Pain in joint, shoulder region 04/02/2013   left   Peptic ulcer    Peripheral neuropathy    Seasonal allergies     PAST SURGICAL HISTORY:  Past Surgical History:  Procedure Laterality Date   ABDOMINAL HYSTERECTOMY  2003   CESAREAN SECTION  1989   COLONOSCOPY     NM MYOVIEW LTD  04/18/2014    LexiScan: EF 60%; no evidence of ischemia or infarction. Normal stress test to   RECTOCELE REPAIR  2009   removal of vaginal mesh  11/2012   TRANSTHORACIC ECHOCARDIOGRAM  04/18/2014   EF 55-60%. Difficult to assess for regional wall motion due to poor quality.  Normal diastolic pressures/function. No significant valvular lesions.   TRANSTHORACIC ECHOCARDIOGRAM  06/2017   Normal left ventricular size and systolic function with no RWMA.  Ef 55-60%. Mild MR.   UPPER GASTROINTESTINAL ENDOSCOPY     VAGINA RECONSTRUCTION SURGERY  2011   vaginal mesh repair   VAGINAL HYSTERECTOMY  2003    FAMILY HISTORY: Family History  Problem Relation Age of Onset   Emphysema Mother    Allergies Mother    Asthma Mother    Hypertension Mother    Heart failure Mother    Breast cancer Mother 35   CAD Mother    Stroke Mother 56   Thyroid disease Mother    Hypertension Father    Arthritis Father    Congestive Heart Failure Father        Died from complications at age 59   Congenital heart disease Father    CAD Father    Gout Father    Hypothyroidism Father    Rheum arthritis Maternal Grandfather    CAD Maternal Grandfather    Heart attack Maternal Grandfather 39       Sudden cardiac death   Breast cancer Maternal Grandmother 59   Hypertension Paternal Grandmother    Heart disease Paternal Grandmother    Stroke Paternal Grandmother    Heart attack Paternal Grandfather        Sudden cardiac death   Hypertension Son    Arrhythmia Son    Arthritis Sister    Hypothyroidism Sister    Hypertension Sister    Coronary artery disease Brother    Hypothyroidism Brother    Heart attack Brother 85       Died from heart attack, sudden cardiac death   Hypothyroidism Sister    Hypothyroidism Brother    Hypertension Brother     SOCIAL HISTORY:  Social History   Socioeconomic History   Marital status: Married    Spouse name: [name]   Number of children: 3    Years of education: Energy manager   Highest education level: Not on file  Occupational History   Occupation: Charity fundraiser    Comment: has not worked in 2 years  Ecologist strain: Not on file   Food insecurity:    Worry: Not on file    Inability: Not on Occupational hygienist needs:    Medical: Not on file    Non-medical: Not on file  Tobacco Use   Smoking status: Never Smoker   Smokeless tobacco: Never Used  Substance and Sexual Activity   Alcohol use: No   Drug use: No   Sexual activity: Yes    Birth control/protection: None  Lifestyle   Physical activity:    Days per week: Not on file    Minutes per session: Not on file   Stress: Not on file  Relationships   Social connections:    Talks on phone: Not on file    Gets together: Not on file    Attends religious service:  Not on file    Active member of club or organization: Not on file    Attends meetings of clubs or organizations: Not on file    Relationship status: Not on file   Intimate partner violence:    Fear of current or ex partner: Not on file    Emotionally abused: Not on file    Physically abused: Not on file    Forced sexual activity: Not on file  Other Topics Concern   Not on file  Social History Narrative   [name]. Three children.   Patient has a Acupuncturist.  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  (Is concerned about lower salary range of benefits)..   Patient is right-handed.   Patient drinks 2-3 Diet sodas daily.   Does not Drink EtOH or smoke      Both her mother and father have died within the past year or so.     PHYSICAL EXAM  There were no vitals filed for this visit.  There is no height or weight on file to calculate BMI.   General: The patient is well-developed and well-nourished and in no acute distress   Skin: Extremities are without rash or  edema.  Musculoskeletal:  She has mild tenderness over the muscles of the upper chest and back and some of the classic FMS tender points.  Neurologic Exam  Mental status: The patient is alert and oriented x 3 at the time of the examination. The patient has apparent normal recent and remote memory, with an apparently normal attention span and concentration ability.   Speech is normal.  Cranial nerves: Extraocular movements are full. Facial strength and sensation is normal. Trapezius strength is normal..  The tongue is midline, and the patient has symmetric elevation of the soft palate. No obvious hearing deficits are noted.  Motor:  Muscle bulk is normal.   Tone is normal.  Strength was 5/5 in the arms and legs including the intrinsic foot muscles.  Sensory: Sensory testing is intact to touch and vibration sensation in the arms and proximally in the legs.  She reports decreased vibration and touch sensation in the distal foot.    Coordination: Cerebellar testing shows good finger-nose-finger and heel-to-shin.  Gait and station: Station is normal.   Gait is normal. Tandem gait is normal.   Reflexes: Deep tendon reflexes are symmetric and normal bilateral.      Dyshaun Bonzo A. Epimenio Foot, MD, PhD 04/27/2019, 5:07 PM Certified in Neurology, Clinical Neurophysiology, Sleep Medicine, Pain Medicine and Neuroimaging  [facility] [address], [address] [place], [place] [address] [phone]

## 2019-07-01 ENCOUNTER — Other Ambulatory Visit: Payer: Self-pay | Admitting: Cardiology

## 2019-07-24 ENCOUNTER — Telehealth: Payer: Self-pay | Admitting: Neurology

## 2019-07-24 MED ORDER — ZOLPIDEM TARTRATE ER 6.25 MG PO TBCR: 6.2500 mg | EXTENDED_RELEASE_TABLET | Freq: Every evening | ORAL | 5 refills | Status: DC | PRN

## 2019-07-24 NOTE — Telephone Encounter (Signed)
We could change to the Ambien CR 6.25 mg nightly.  It would last longer.

## 2019-07-24 NOTE — Telephone Encounter (Signed)
Dr. Sater- please advise 

## 2019-07-24 NOTE — Telephone Encounter (Signed)
Called and relayed Dr. Garth Bigness recommendation. She is agreeable to plan. I e-scribed rx ambien CR 6.25mg  to pharmacy on file. Asked ambien 5mg  tab be d/c'd

## 2019-07-24 NOTE — Telephone Encounter (Signed)
Pt states that she is still having trouble getting to sleep on the zolpidem (AMBIEN) 5 MG tablet but once she is asleep she is good. She would like to know if there is a higher dosage she can take. Please advise.

## 2019-08-01 ENCOUNTER — Other Ambulatory Visit: Payer: Self-pay | Admitting: Neurology

## 2019-08-27 ENCOUNTER — Other Ambulatory Visit: Payer: Self-pay | Admitting: Cardiology

## 2019-10-11 ENCOUNTER — Other Ambulatory Visit: Payer: Self-pay

## 2019-10-11 ENCOUNTER — Encounter: Payer: Self-pay | Admitting: Neurology

## 2019-10-11 ENCOUNTER — Ambulatory Visit: Payer: BC Managed Care – PPO | Admitting: Neurology

## 2019-10-11 VITALS — BP 176/82 | HR 75 | Temp 97.4°F | Ht 64.0 in | Wt 235.0 lb

## 2019-10-11 DIAGNOSIS — E559 Vitamin D deficiency, unspecified: Secondary | ICD-10-CM | POA: Diagnosis not present

## 2019-10-11 DIAGNOSIS — E039 Hypothyroidism, unspecified: Secondary | ICD-10-CM

## 2019-10-11 DIAGNOSIS — R413 Other amnesia: Secondary | ICD-10-CM | POA: Diagnosis not present

## 2019-10-11 DIAGNOSIS — G629 Polyneuropathy, unspecified: Secondary | ICD-10-CM | POA: Diagnosis not present

## 2019-10-11 NOTE — Progress Notes (Addendum)
[facility]  PATIENT: Tersea Aulds Sistrunk DOB: [birth year]  REFERRING DOCTOR OR PCP:  Sandford Craze SOURCE: patient and EMR  _________________________________   HISTORICAL  CHIEF COMPLAINT:  Chief Complaint  Patient presents with  . Consult for New Problem    Rm 12 alone~ Referred by [facility]ques for changes within memory  . Memory Loss    MoCHA~ 26/30 sts she has trouble finding words and simple math/spelling.   . Dropping Items    Pt sts she has had increased trouble holding on to house hold objects ( ex, spoons, plates,)    HISTORY OF PRESENT ILLNESS:  Khristina Janota is a 58 y.o. woman with obstructive sleep apnea and pain in her hands, legs and feet  Update 10/11/2019: She is noting more trouble with her memory.    She notes word finding issues and math mistakes.     She notes the memory problems the most but hr husband and sister have also noticed problems.    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    She has noted some issues with navigation while driving (which street to turn on).   She is not driving much.   She denies problems with her mood.     She sleeps ok with her weight loss.     She wakes up a few times a night.  She snores less.   Husband sleeps in a differnet room so is not sure about OSA signs.   She used to be on CPAP when her weight was higher.  She had severe OSA with an AHI equals 56 prior to being placed on CPAP.  She takes Ambien 6.25 mg with some benefit though still has some sleep onset insomnia.    She feels clumsy and drops items a lot.    She feels the neuropathic pain has done well on Belbuca plus lamotrigine.  She has hypothyroidism and is on Synthroid.  Her TSH earlier this year was 0.79.  She has lost 100 pounds over the past year by diet.  She has had abdominal pain and diarrhea.   She is seeing Dr. Lanae Boast in [facility] an has had endoscopy and colonoscopy.    Montreal Cognitive Assessment   10/11/2019 10/11/2019  Visuospatial/ Executive (0/5) 4 4  Naming (0/3) 3 3  Attention: Read list of digits (0/2) 2 2  Attention: Read list of letters (0/1) 1 1  Attention: Serial 7 subtraction starting at 100 (0/3) 3 3  Language: Repeat phrase (0/2) 1 1  Language : Fluency (0/1) 0 0  Abstraction (0/2) 2 2  Delayed Recall (0/5) 4 4  Orientation (0/6) 6 6  Total 26 26  Adjusted Score (based on education) - 26    Update 6/42020 (virtual) She feels her neuropathic pain is better with Belbuca.  She is still on lamotrigine 200 mg po bid but the combination has helped her more than the lamotrigine alone.  Pain goes up to her calves.   THe soles are constantly numb and the numbness is partial to the mid calves.    She notes mild numbness in her hands.  She changed to a different CPAP mask last year and it is helping her more.   She feels she is sleeping better.   She wakes up twice a night to urinate but falls back asleep.  She uses CPAP > 4 hours most nights.    Ambien has helped her insomnia.  She has lost 30 pounds over the last 6 months.   She exercises some and was going to the pool but that was stopped with COVID19.     Mood is doing better.    She is on Effexor XR 300 mg.        Update 07/27/2018: She is still noting numbness with a prickling sensation and superimposed stings.    Lamotrigine has helped at 150 mg po bid.  She tolerates it well.    Hands are almost pain free now but the feet are only slightly better.       She has OSA.    She had trouble wearing a mask.   Her pain and stuffed up sensation to her nose led to problems.  Many masks have been tried.    In 2009 a PSG showed an AHI equals 56.  She was titrated to CPAP +8 cm during the split-night study.  Depression is doing well.  This was a major problem in the past.  She continues on Effexor and tolerates it well.  She no longer sees psychiatry.   From 06/22/2017: Pain/dysesthesia:   At the last visit lamotrigine was started  and she feels about 40% better.  The painful numbness with burning and tingling in the hands and feet began about 4 years ago. It has been slightly progressive but actually more stable since starting lamotrigine.   .   Does not have diabetes mellitus. She was checked for vasculitis and the ANA was negative. CRP was elevated but sedimentation rate was normal.   OSA/insomnia:   She was diagnosed many year ago with OSA (severe with AHI = 52). .   She has CPAP but can't get used to the mask.  FF mask felt  claustophobic and the nasal pillows caused congestion.    Many masks were tried.      She was never able to use it for 4 hours on average a night.    APAP did not help compliance.    She wakes up gasping for air.    She has difficulty falling asleep and staying asleep.   Falling asleep is more difficult due to a lot of thoughts going through her mind.   Insomnia did not improve with benzodiazepine or with Ambien in the past.  Mood:  Mood is doing better the past year.   She had severe depression and did E[facility] x 6 [facility].   She was very apathetic and wouldn't leave her home.   She is much better now.   There is also some anxiety which also improved  REVIEW OF SYSTEMS: Constitutional: No fevers, chills, sweats, or change in appetite.  She has insomnia that is worse when she wears her OSA mask.. Eyes: No visual changes, double vision, eye pain Ear, nose and throat: No hearing loss, ear pain, nasal congestion, sore throat Cardiovascular: No chest pain, palpitations Respiratory: No shortness of breath at rest or with exertion.   No wheezes.   OSA on CPAP intermittently GastrointestinaI: No nausea, vomiting, diarrhea, abdominal pain, fecal incontinence Genitourinary: No dysuria, urinary retention or frequency.  No nocturia. Musculoskeletal: Notes some neck pain, back pain Integumentary: No rash, pruritus, skin lesions Neurological: as above Psychiatric: Notes depression and anxiety at this time.   Endocrine: No palpitations, diaphoresis, change in appetite, change in weigh or increased thirst Hematologic/Lymphatic: No anemia, purpura, petechiae. Allergic/Immunologic: No itchy/runny eyes, nasal congestion, recent allergic reactions, rashes  ALLERGIES: Allergies  Allergen Reactions  .  Demerol     Irritability, and shortness of breath    HOME MEDICATIONS:  Current Outpatient Medications:  .  acetaminophen (TYLENOL) 500 MG tablet, Take 1,000 mg by mouth daily as needed for mild pain. , Disp: , Rfl:  .  albuterol (PROVENTIL HFA;VENTOLIN HFA) 108 (90 Base) MCG/ACT inhaler, Inhale 2 puffs into the lungs every 6 (six) hours as needed for wheezing or shortness of breath. , Disp: , Rfl:  .  Alpha-Lipoic Acid 600 MG CAPS, Take 600 mg by mouth daily. , Disp: , Rfl:  .  Buprenorphine HCl (BELBUCA) 150 MCG FILM, Place 1 Dose inside cheek 2 (two) times daily as needed. 450 mg, Disp: , Rfl:  .  carvedilol (COREG) 12.5 MG tablet, Take 1 tablet (12.5 mg total) by mouth 2 (two) times daily. OFFICE VISIT NEEDED 2nd attempt, Disp: 60 tablet, Rfl: 0 .  Cholecalciferol (VITAMIN D3) 5000 units CAPS, Take 5,000 Units by mouth daily. , Disp: , Rfl:  .  cyclobenzaprine (FLEXERIL) 5 MG tablet, TAKE 1 TABLET BY MOUTH 3 TIMES DAILY AS NEEDED FOR MUSCLE SPASM. (Patient taking differently: Take 5 mg by mouth 3 (three) times daily as needed for muscle spasms. ), Disp: 60 tablet, Rfl: 0 .  Diosmin POWD, Take 1,000 mg by mouth daily., Disp: , Rfl:  .  Diphenoxylate-Atropine (LOMOTIL PO), Take by mouth., Disp: , Rfl:  .  furosemide (LASIX) 40 MG tablet, Take 80 mg by mouth daily as needed for fluid or edema., Disp: , Rfl:  .  lamoTRIgine (LAMICTAL) 200 MG tablet, TAKE 1 TABLET BY MOUTH TWICE A DAY, Disp: 60 tablet, Rfl: 5 .  levothyroxine (SYNTHROID, LEVOTHROID) 175 MCG tablet, Take 175 mcg by mouth daily before breakfast., Disp: , Rfl:  .  loperamide (IMODIUM) 2 MG capsule, Take 2 mg by mouth as needed for diarrhea  or loose stools., Disp: , Rfl:  .  loratadine (CLARITIN) 10 MG tablet, Take 10 mg by mouth daily as needed for allergies., Disp: , Rfl:  .  losartan (COZAAR) 100 MG tablet, Take 1 tablet (100 mg total) by mouth daily., Disp: 30 tablet, Rfl: 11 .  magnesium oxide (MAG-OX) 400 MG tablet, Take 400 mg by mouth daily., Disp: , Rfl:  .  Melatonin 5 MG TABS, Take 5 mg by mouth at bedtime. , Disp: , Rfl:  .  Multiple Vitamins-Minerals (MULTIVITAMIN PO), Take 1 tablet by mouth daily., Disp: , Rfl:  .  omeprazole (PRILOSEC) 40 MG capsule, TAKE 1 CAPSULE (40 MG TOTAL) BY MOUTH DAILY. (Patient taking differently: Take 40 mg by mouth daily. ), Disp: 30 capsule, Rfl: 0 .  ondansetron (ZOFRAN) 4 MG tablet, Take by mouth., Disp: , Rfl:  .  potassium chloride (K-DUR,KLOR-CON) 10 MEQ tablet, Take 10 mEq by mouth 3 (three) times daily as needed (for fluid and edema). , Disp: , Rfl:  .  rosuvastatin (CRESTOR) 5 MG tablet, Take 5 mg by mouth daily., Disp: , Rfl:  .  TURMERIC PO, Take 1 tablet by mouth daily., Disp: , Rfl:  .  venlafaxine XR (EFFEXOR-XR) 150 MG 24 hr capsule, Take 300 mg by mouth daily with breakfast., Disp: , Rfl:  .  zolpidem (AMBIEN CR) 6.25 MG CR tablet, Take 1 tablet (6.25 mg total) by mouth at bedtime as needed for sleep., Disp: 30 tablet, Rfl: 5  PAST MEDICAL HISTORY: Past Medical History:  Diagnosis Date  . 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 Depression    Counseling-Dr  Pam Pittman[facility]  . Fibromyalgia    Rheumatologist- Dr. Ardis Hughs -- new Dx of Adiposo Dolorosa.  Marland Kitchen GERD (gastroesophageal reflux disease)   . Headache(784.0)    daily  . History of chicken pox    childhood  . Hyperlipidemia   . Hypertension   . Hypothyroidism   . Lymphedema of lower extremity    with associated Lipidema.  . Migraine    1-2 per month  . Morbid obesity with BMI of 45.0-49.9, adult (HCC)   . Neck injury    C-5-6  . Non compliance with medical  treatment 04/02/2014  . OSA on CPAP    However, she does not routinely use her CPAP every day.  . Pain in joint, shoulder region 04/02/2013   left  . Peptic ulcer   . Peripheral neuropathy   . Seasonal allergies     PAST SURGICAL HISTORY: Past Surgical History:  Procedure Laterality Date  . ABDOMINAL HYSTERECTOMY  2003  . CESAREAN SECTION  1989  . COLONOSCOPY    . NM MYOVIEW LTD  04/18/2014   LexiScan: EF 60%; no evidence of ischemia or infarction. Normal stress test to  . RECTOCELE REPAIR  2009  . removal of vaginal mesh  11/2012  . TRANSTHORACIC ECHOCARDIOGRAM  04/18/2014   EF 55-60%. Difficult to assess for regional wall motion due to poor quality.  Normal diastolic pressures/function. No significant valvular lesions.  . TRANSTHORACIC ECHOCARDIOGRAM  06/2017   Normal left ventricular size and systolic function with no RWMA.  Ef 55-60%. Mild MR.  Marland Kitchen UPPER GASTROINTESTINAL ENDOSCOPY    . VAGINA RECONSTRUCTION SURGERY  2011   vaginal mesh repair  . VAGINAL HYSTERECTOMY  2003    FAMILY HISTORY: Family History  Problem Relation Age of Onset  . Emphysema Mother   . Allergies Mother   . Asthma Mother   . Hypertension Mother   . Heart failure Mother   . Breast cancer Mother 31  . CAD Mother   . Stroke Mother 69  . Thyroid disease Mother   . Hypertension Father   . Arthritis Father   . Congestive Heart Failure Father        Died from complications at age 62  . Congenital heart disease Father   . CAD Father   . Gout Father   . Hypothyroidism Father   . Rheum arthritis Maternal Grandfather   . CAD Maternal Grandfather   . Heart attack Maternal Grandfather 65       Sudden cardiac death  . Breast cancer Maternal Grandmother 55  . Hypertension Paternal Grandmother   . Heart disease Paternal Grandmother   . Stroke Paternal Grandmother   . Heart attack Paternal Grandfather        Sudden cardiac death  . Hypertension Son   . Arrhythmia Son   . Arthritis Sister   .  Hypothyroidism Sister   . Hypertension Sister   . Coronary artery disease Brother   . Hypothyroidism Brother   . Heart attack Brother 63       Died from heart attack, sudden cardiac death  . Hypothyroidism Sister   . Hypothyroidism Brother   . Hypertension Brother     SOCIAL HISTORY:  Social History   Socioeconomic History  . Marital status: Married    Spouse name: [name]  . Number of children: 3  . Years of education: Bachelor's  . Highest education level: Not on file  Occupational History  . Occupation: Charity fundraiser    Comment:  has not worked in 2 years  Social Needs  . Financial resource strain: Not on file  . Food insecurity    Worry: Not on file    Inability: Not on file  . Transportation needs    Medical: Not on file    Non-medical: Not on file  Tobacco Use  . Smoking status: Never Smoker  . Smokeless tobacco: Never Used  Substance and Sexual Activity  . Alcohol use: No  . Drug use: No  . Sexual activity: Yes    Birth control/protection: None  Lifestyle  . Physical activity    Days per week: Not on file    Minutes per session: Not on file  . Stress: Not on file  Relationships  . Social Herbalist on phone: Not on file    Gets together: Not on file    Attends religious service: Not on file    Active member of club or organization: Not on file    Attends meetings of clubs or organizations: Not on file    Relationship status: Not on file  . Intimate partner violence    Fear of current or ex partner: Not on file    Emotionally abused: Not on file    Physically abused: Not on file    Forced sexual activity: Not on file  Other Topics Concern  . Not on file  Social History Narrative   Patient is married Evert Kohl) and lives at home with her husband. Three children.   Patient has a Chiropodist.  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  (Is concerned about lower salary range of benefits)..   Patient is right-handed.    Patient drinks 2-3 Diet sodas daily.   Does not Drink EtOH or smoke      Both her mother and father have died within the past year or so.     PHYSICAL EXAM  Vitals:   10/11/19 1024  BP: (!) 176/82  Pulse: 75  Temp: (!) 97.4 F (36.3 C)  TempSrc: Temporal  Weight: 235 lb (106.6 kg)  Height: 5\' 4"  (1.626 m)    Body mass index is 40.34 kg/m.   General: The patient is well-developed and well-nourished and in no acute distress.  Sclera are anicteric.  She appears to have some proptosis and lid lag.   Skin: Extremities are without rash or edema.  Musculoskeletal:  She has mild tenderness over the muscles of the upper chest and back and some of the classic FMS tender points.  Neurologic Exam  Mental status: The patient is alert and oriented x 3 at the time of the examination. The patient has reduced recent (1/3 without prompt and 3/3 with prompt) and normal remote memory, with an apparently normal attention span and concentration ability ([id number]).  Clock is drawn fairly well.   She scored 26/30 on the [facility] cognitive assessment.  Details are above.  Speech is normal.  Cranial nerves: Extraocular movements are full. Facial strength and sensation is normal. Trapezius strength is normal..  The tongue is midline, and the patient has symmetric elevation of the soft palate. No obvious hearing deficits are noted.  Motor:  Muscle bulk is normal.   Tone is normal.  Strength was 5/5 in the arms and legs including the intrinsic foot muscles.  Sensory: Sensory testing is intact to touch and vibration sensation in the arms and proximally in the legs.  She has decreased vibration and touch  sensation in the distal foot.    Coordination: Cerebellar testing shows good finger-nose-finger and heel-to-shin.  Gait and station: Station is normal.   Gait is normal. Tandem gait is normal.   Reflexes: Deep tendon reflexes are symmetric and normal bilateral.     Neuropathy - Plan:  Vitamin B12, Copper, Serum  Memory loss - Plan: Vitamin B12, Copper, Serum  Hypothyroidism, unspecified type - Plan: Thyroid Panel With TSH  Vitamin D deficiency - Plan: Vitamin D, 25-hydroxy   1.   She has subjective complaints of memory loss and had a borderline score of 26/30 on the [facility] cognitive assessment consistent with mild cognitive impairment and almost in the normal range.  Her memory loss is more likely to be due to decreased focus/attention rather than to a degenerative process given her performance on the [facility] cognitive assessment and evaluation today.  Decrease focus/attention could be due to other medical issues (i.e. thyroid, B12, etc.), mood disturbances and sleep issues (had been on CPAP for OSA in the past) 2.   I will check blood work for vitamin B-12, thyroid, copper and vitamin D.  If memory loss worsens significantly we will check a noncontrasted MRI of the brain and PSG.  If she does not improve and an alternative diagnosis does not come forward, consider a stimulant such as Ritalin or Adderall. 3.   Stay active, exercise as possible. 4.   Return in 6 months or sooner if there are new or worsening neurologic symptoms. Asmi Fugere A. Epimenio Foot, MD, PhD 10/11/2019, 10:44 AM Certified in Neurology, Clinical Neurophysiology, Sleep Medicine, Pain Medicine and Neuroimaging  [facility] [address] [place], [place] [address] [phone]

## 2019-10-11 NOTE — Addendum Note (Signed)
Addended by: Arlice Colt A on: 10/11/2019 12:58 PM   Modules accepted: Level of Service

## 2019-10-13 LAB — THYROID PANEL WITH TSH
Free Thyroxine Index: 2.8 (ref 1.2–4.9)
T3 Uptake Ratio: 31 % (ref 24–39)
T4, Total: 9 ug/dL (ref 4.5–12.0)
TSH: 2.62 u[IU]/mL (ref 0.450–4.500)

## 2019-10-13 LAB — VITAMIN B12: Vitamin B-12: 630 pg/mL (ref 232–1245)

## 2019-10-13 LAB — COPPER, SERUM: Copper: 138 ug/dL (ref 72–166)

## 2019-10-13 LAB — VITAMIN D 25 HYDROXY (VIT D DEFICIENCY, FRACTURES): Vit D, 25-Hydroxy: 31.2 ng/mL (ref 30.0–100.0)

## 2019-10-16 ENCOUNTER — Telehealth: Payer: Self-pay

## 2019-10-16 IMAGING — CT CT ANGIO CHEST
2 of 8 series · 18 of 36 positions shown · IV contrast (iopamidol)
Comparison: Chest radiograph September 06, 2018

CLINICAL DATA: Worsening chest pain for 3 days, shortness of
breath. Lower extremity edema.

EXAM:
CT ANGIOGRAPHY CHEST WITH CONTRAST
TECHNIQUE: Multidetector CT imaging of the chest was performed using the
standard protocol during bolus administration of intravenous
contrast. Multiplanar CT image reconstructions and MIPs were
obtained to evaluate the vascular anatomy.
CONTRAST:  100mL [id number] IOPAMIDOL ([id number]-TXT) INJECTION 76%

[Series 6: thins · axial · 0.83mm/px · z∈[+1223,+1443]mm · 17 of 246 slices shown]
[im 13/246  lung]
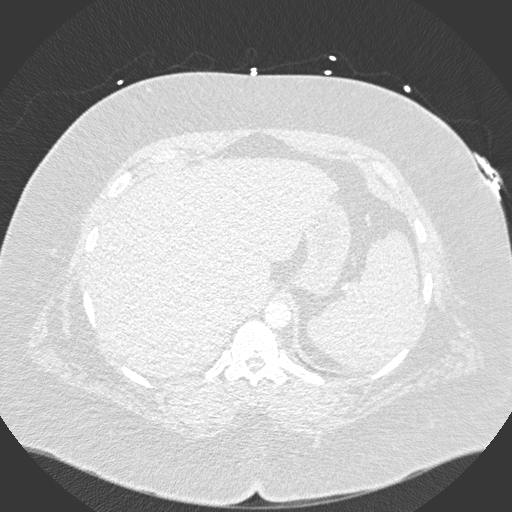
[im 26/246  mediastinal]
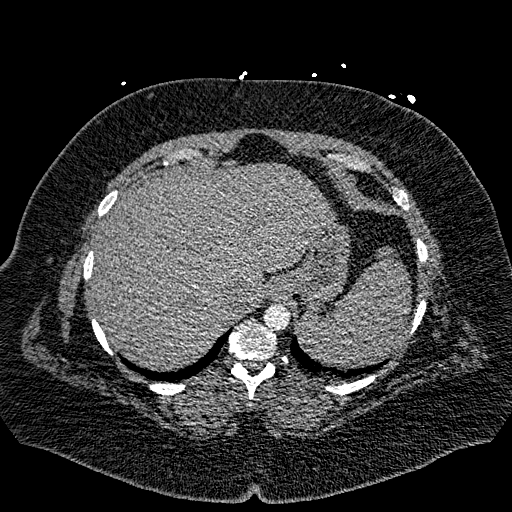
[im 39/246  lung]
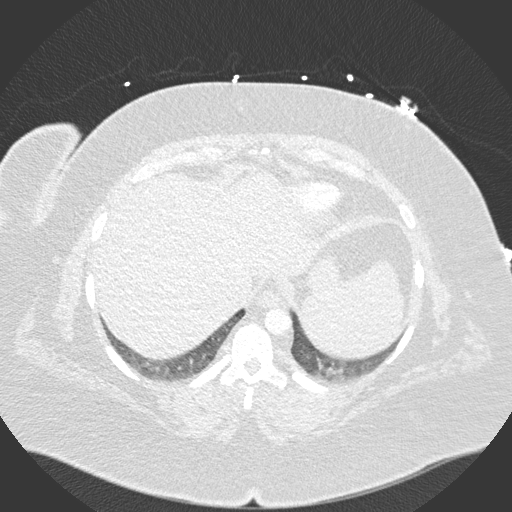
[im 52/246  mediastinal]
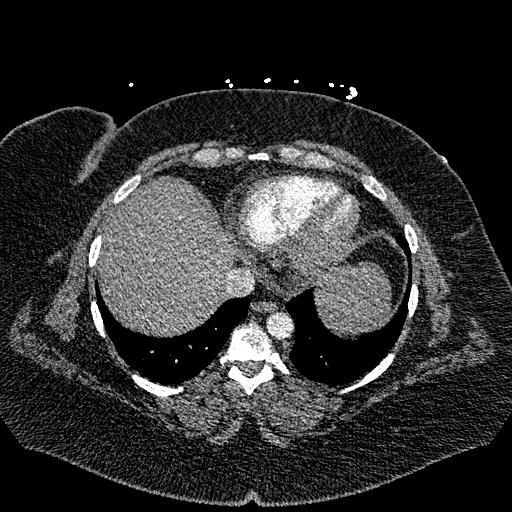
[im 65/246  lung]
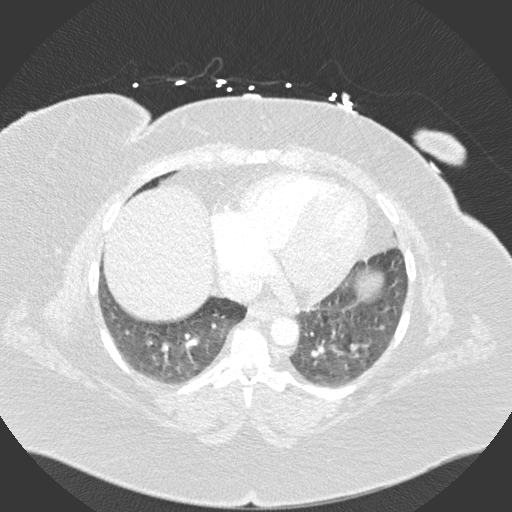
[im 78/246  mediastinal]
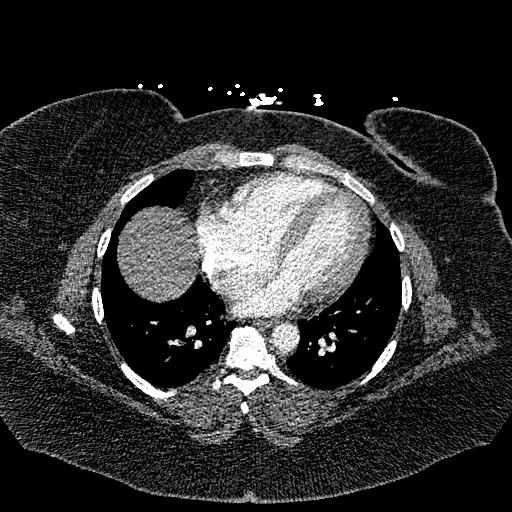
[im 91/246  lung]
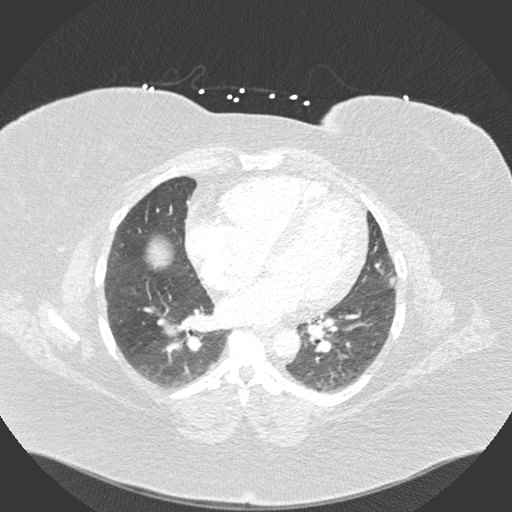
[im 104/246  mediastinal]
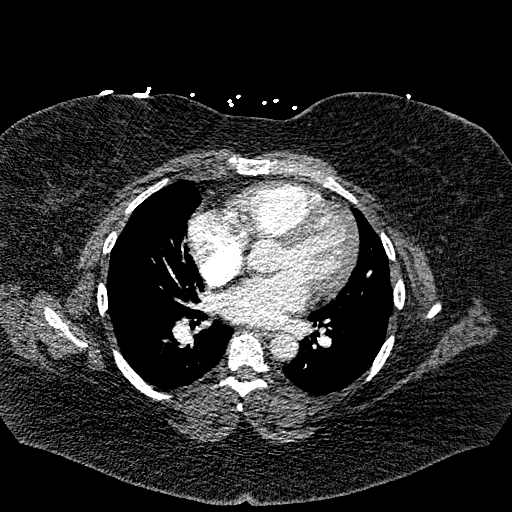
[im 129/246  lung]
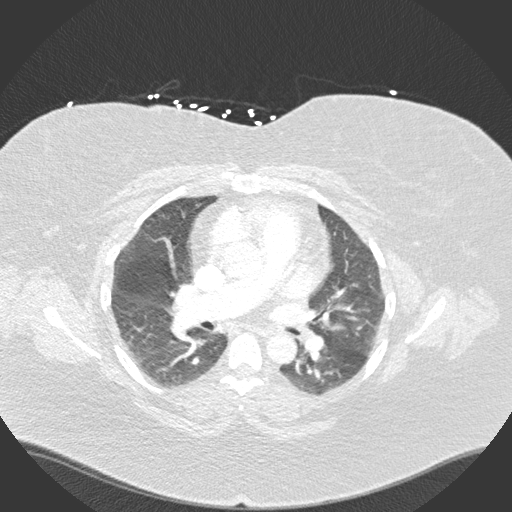
[im 142/246  mediastinal]
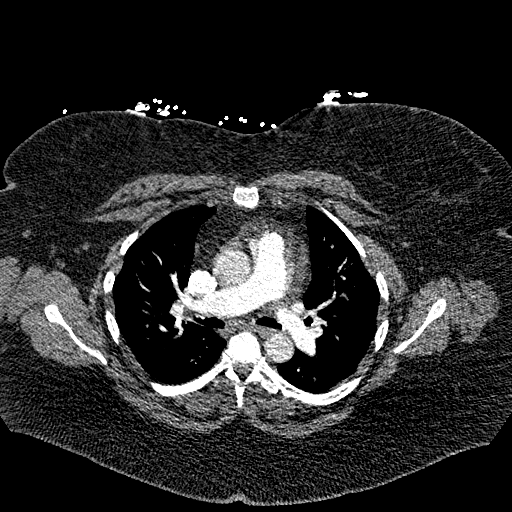
[im 155/246  lung]
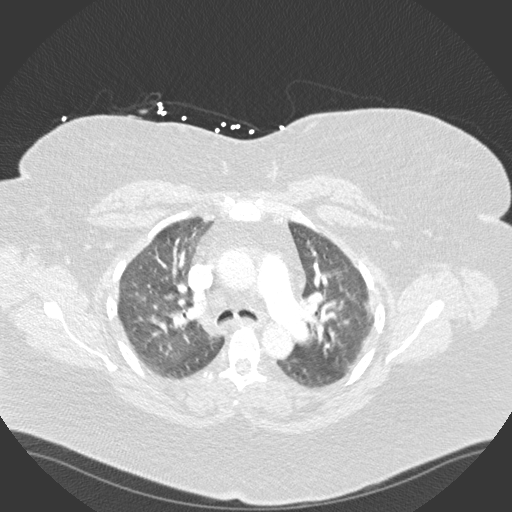
[im 168/246  mediastinal]
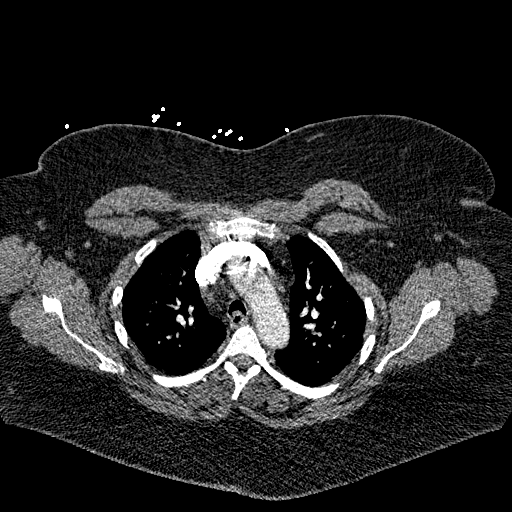
[im 181/246  lung]
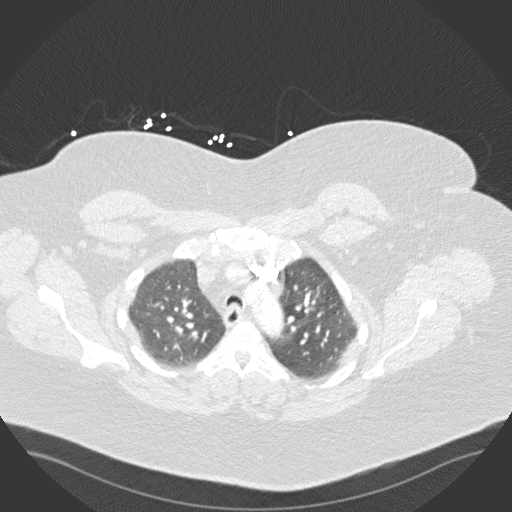
[im 194/246  mediastinal]
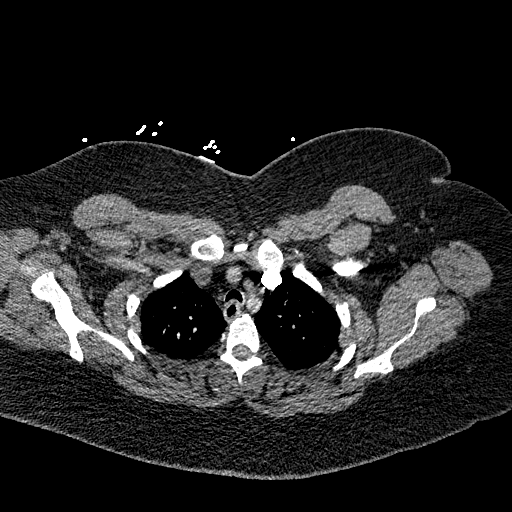
[im 207/246  lung]
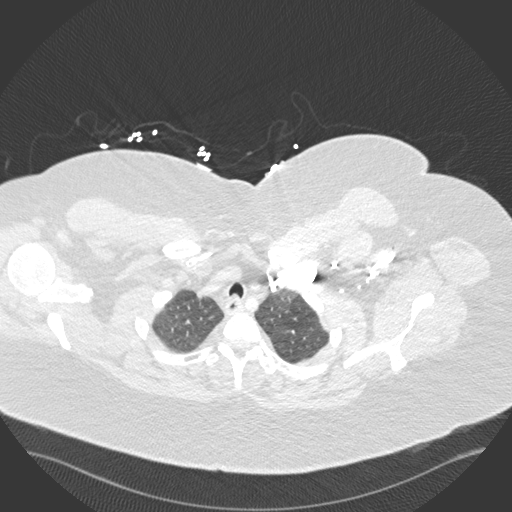
[im 220/246  mediastinal]
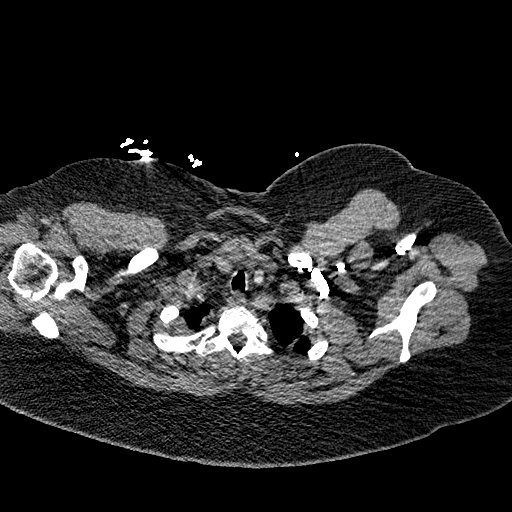
[im 233/246  lung]
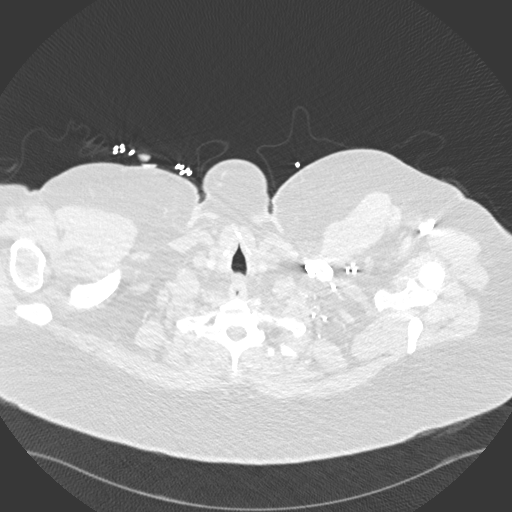

[Series 8: coronal mpr · coronal · 0.50mm/px · 1 of 173 slices shown]
[im 87/173  mediastinal]
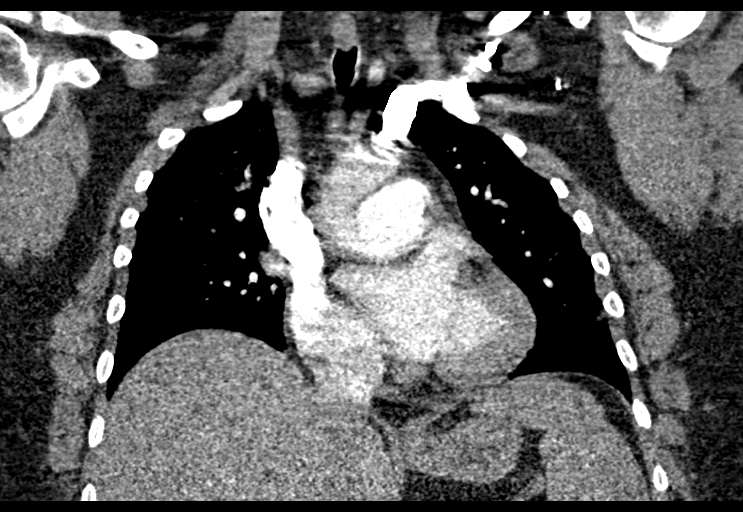

[18 of 36 positions shown; findings below may reference images not displayed]

FINDINGS: CARDIOVASCULAR: Adequate contrast opacification of the pulmonary
artery's. Main pulmonary artery is not enlarged. No pulmonary
arterial filling defects to the level of the subsegmental branches.
Heart size is mildly enlarged. No pericardial effusion. Thoracic
aorta is normal course and caliber, mild calcific atherosclerosis.

MEDIASTINUM/NODES: No lymphadenopathy by CT size criteria.

LUNGS/PLEURA: Tracheobronchial tree is patent, no pneumothorax. Mild
tracheobronchial malacia no pleural effusions, focal consolidations,
pulmonary nodules or masses. Mosaic lung attenuation

UPPER ABDOMEN: Non-acute.

MUSCULOSKELETAL: Non-acute. Large T9-10 disc osteophyte complex
resulting in moderate to severe canal stenosis.

Review of the MIP images confirms the above findings.
IMPRESSION: 1. No acute pulmonary embolism.
2. Mild cardiomegaly. Heterogeneous lung attenuation seen with
pulmonary edema or small airway disease. No focal consolidation.
3. Moderate to severe T9-10 canal stenosis.

Aortic Atherosclerosis (EVHNB-TJ2.2).

## 2019-10-16 NOTE — Telephone Encounter (Signed)
-----   Message from Britt Bottom, MD sent at 10/14/2019  9:34 AM EST ----- Please let her know: Vit D was low normal --- can take 2000 units Vit D OTC daily Other labs were normal

## 2019-10-16 NOTE — Telephone Encounter (Signed)
I reached out to the pt and advised of lab results.  Pt verbalized understanding. She did want to report she is currently taking 5000 units of vitamin D daily. Did not know if this information would change MD's recommendation?

## 2019-10-16 NOTE — Telephone Encounter (Signed)
She can continue taking 5000 daily.

## 2019-10-17 NOTE — Telephone Encounter (Signed)
I reached out to the pt and left a vm ( ok per dpr) advising ok to take 5000 units daily.

## 2019-10-18 IMAGING — CT CT HEART MORP W/ CTA COR W/ SCORE W/ CA W/CM &/OR W/O CM
4 of 7 series · 8 of 20 positions shown, 9 images · non-contrast
Comparison: 09/12/2018

CLINICAL DATA: 57-year-old female with hypertension, dyspnea on
exertion and recent negative stress test.

EXAM:
Cardiac/Coronary  CT
TECHNIQUE: The patient was scanned on a Phillips Force scanner.

[Series 7: best diast 72 % · axial · 0.39mm/px · z∈[+926,+967]mm · 2 of 308 slices shown, 3 images]
[im 103/308  vessel]
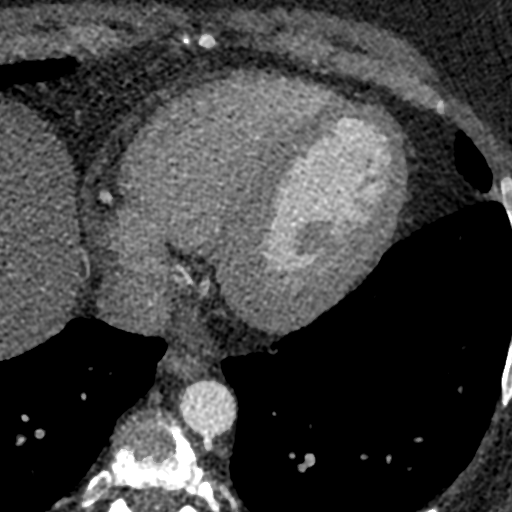
[im 103/308  lung]
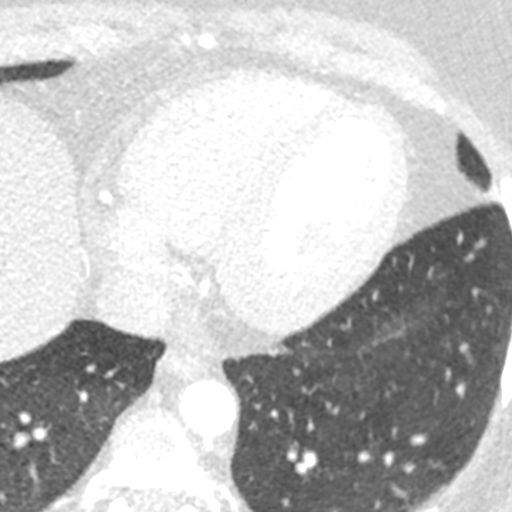
[im 205/308  vessel]
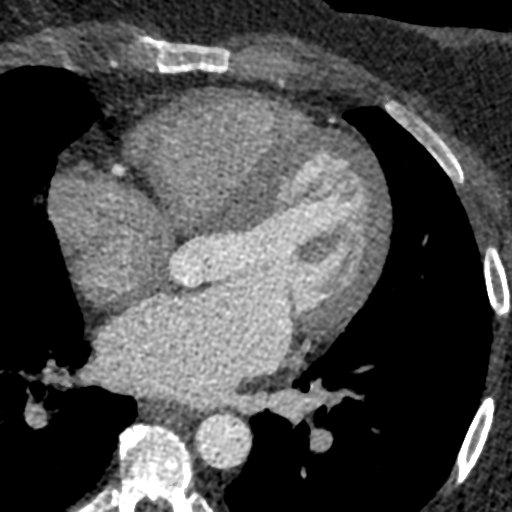

[Series 8: best syst 41 % · axial · 0.39mm/px · z∈[+926,+967]mm · 2 of 308 slices shown]
[im 103/308  vessel]
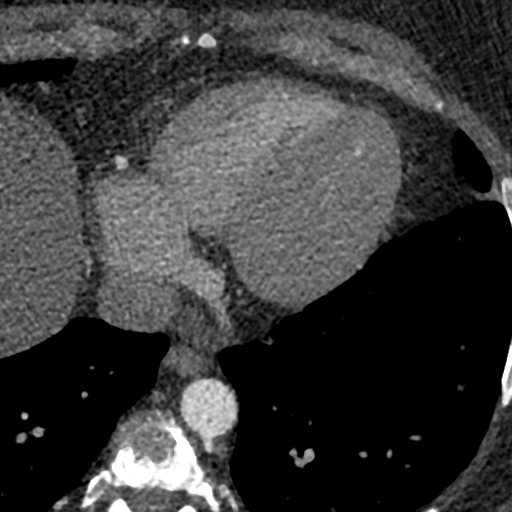
[im 205/308  vessel]
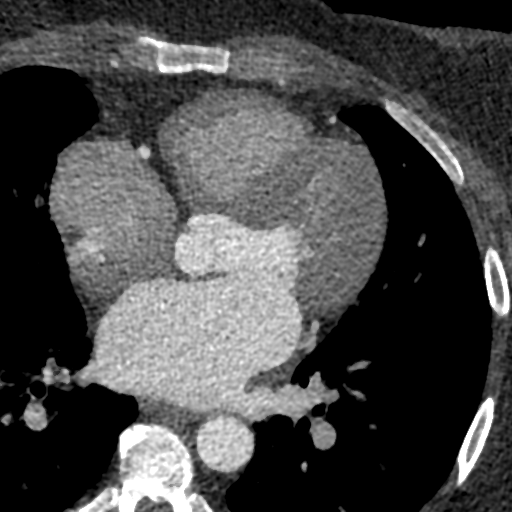

[Series 9: ts diast sharp 72 % · axial · 0.39mm/px · z∈[+926,+967]mm · 2 of 308 slices shown]
[im 103/308  lung]
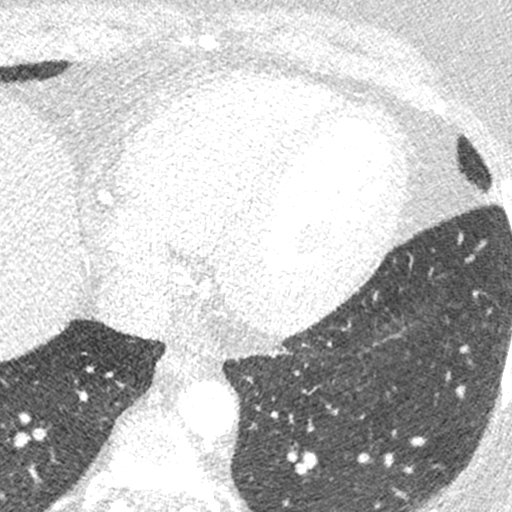
[im 205/308  lung]
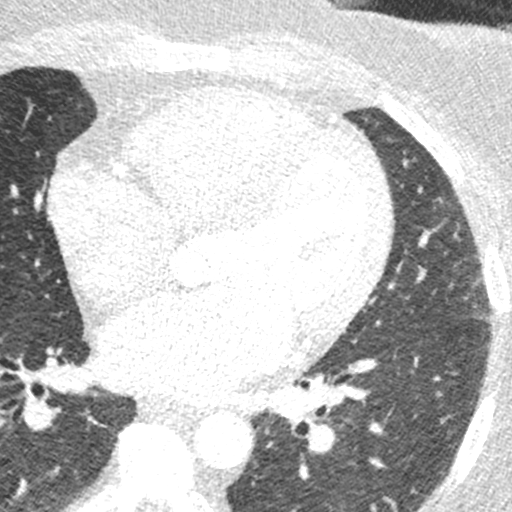

[Series 10: ts syst sharp 41 % · axial · 0.39mm/px · z∈[+926,+967]mm · 2 of 308 slices shown]
[im 103/308  lung]
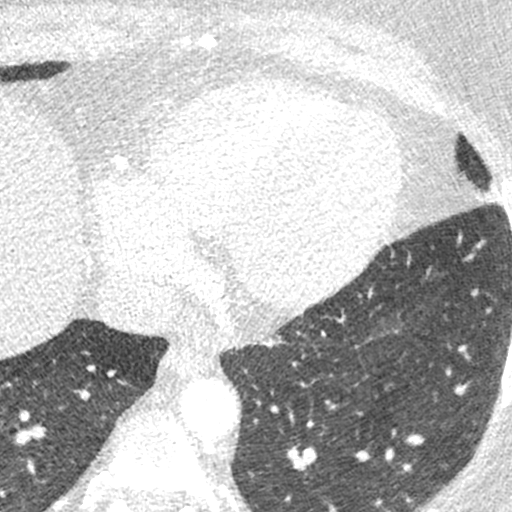
[im 205/308  lung]
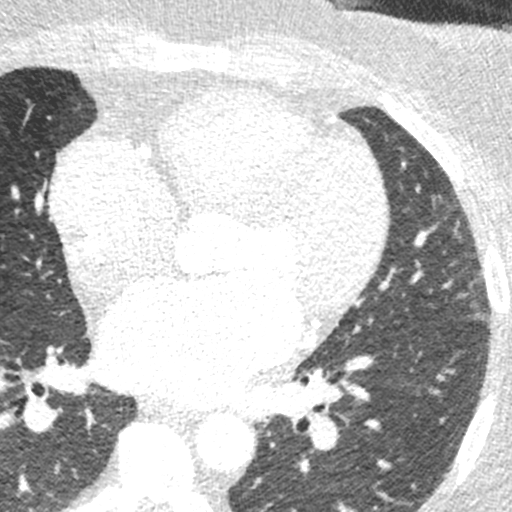

[8 of 20 positions shown; findings below may reference images not displayed]

FINDINGS: A 120 kV prospective scan was triggered in the descending thoracic
aorta at 111 HU's. Axial non-contrast 3 mm slices were carried out
through the heart. The data set was analyzed on a dedicated work
station and scored using the Agatson method. Gantry rotation speed
was 250 msecs and collimation was .6 mm. 50 mg of PO metoprolol and
0.8 mg of sl NTG was given. The 3D data set was reconstructed in 5%
intervals of the 67-82 % of the R-R cycle. Diastolic phases were
analyzed on a dedicated work station using MPR, MIP and VRT modes.
The patient received 80 cc of contrast.

Aorta:  Normal size.  No calcifications.  No dissection.

Aortic Valve:  Trileaflet.  No calcifications.

Coronary Arteries:  Normal coronary origin.  Right dominance.

RCA is a large dominant artery that gives rise to PDA and PLVB.
There is no plaque.

Left main is a large artery that gives rise to LAD and LCX arteries.
Left main has no plaque.

LAD is a large vessel that gives rise to one diagonal artery. There
is minimal plaque.

LCX is a non-dominant artery that gives rise to one large OM1
branch. There is no plaque.

Other findings:

Normal pulmonary vein drainage into the left atrium.

Normal let atrial appendage without a thrombus.

Normal size of the pulmonary artery.
IMPRESSION: 1. Coronary calcium score of 0. This was 0 percentile for age and
sex matched control.

2. Normal coronary origin with right dominance.

3. Minimal non-obstructive CAD.

4. PFO present.

5. Mildly dilated pulmonary artery measuring 31 mm.

EXAM:
OVER-READ INTERPRETATION  CT CHEST

The following report is an over-read performed by radiologist Dr.
Bambucafe Tarla [[email or web address]] on 09/14/2018. This over-read
does not include interpretation of cardiac or coronary anatomy or
pathology. The coronary calcium score/coronary CTA interpretation by
the cardiologist is attached.
FINDINGS: Vascular: Normal caliber of the visualized thoracic aorta. Limited
evaluation of the pulmonary arteries. Minimal pericardial fluid.

Mediastinum/Nodes: Mediastinal structures are unremarkable.

Lungs/Pleura: No large pleural effusions. 3 mm pleural-based nodule
in the left lobe on sequence 13, image 20 is probably an incidental
finding but nonspecific. No significant airspace disease or lung
consolidation. Again noted are a few densities in the periphery of
the lingula that could represent atelectasis or scarring.

Upper Abdomen: No acute abnormality.

Musculoskeletal: Again noted is a large disc osteophyte complex at
T8-T9 causing spinal canal stenosis.
IMPRESSION: 1. No acute abnormality.
2. 3 mm pleural-based nodular density in the left lower lobe is
nonspecific. No follow-up needed if patient is low-risk.
Non-contrast chest CT can be considered in 12 months if patient is
high-risk. This recommendation follows the consensus statement:
Guidelines for Management of Incidental Pulmonary Nodules Detected

## 2019-12-04 ENCOUNTER — Telehealth: Payer: Self-pay | Admitting: Neurology

## 2019-12-04 NOTE — Telephone Encounter (Signed)
Patient left voicemail stating she has been under a lot of stress and having trouble sleeping, she states that she is undergoing a lot of financial stress and the Ambien she is currently on hasnt been helping that well and she want to know if it can be increased or changed. Please advise

## 2019-12-04 NOTE — Telephone Encounter (Signed)
Called, LVM for pt to call office. 

## 2019-12-04 NOTE — Telephone Encounter (Signed)
We can add trazodone 50-100 mg nightly.     Take 1/2 to 1 pill nightly #30   #5 refills

## 2019-12-05 MED ORDER — TRAZODONE HCL 100 MG PO TABS: ORAL_TABLET | ORAL | 5 refills | Status: DC

## 2019-12-05 NOTE — Telephone Encounter (Signed)
Called pt back. She is willing to try trazodone instead. I escribed rx and asked that ambien be d/c'd.

## 2019-12-05 NOTE — Telephone Encounter (Signed)
Pt returned call. Please call back when available. 

## 2019-12-05 NOTE — Telephone Encounter (Signed)
Called, LVM for pt again relaying Dr. Bonnita Hollow recommendation. Asked her to call back to let us know if she would like to try trazodone and if so, what pharmacy she would like it to go to

## 2019-12-13 ENCOUNTER — Other Ambulatory Visit: Payer: Self-pay | Admitting: Neurology

## 2019-12-13 DIAGNOSIS — R413 Other amnesia: Secondary | ICD-10-CM

## 2019-12-13 DIAGNOSIS — R260 Ataxic gait: Secondary | ICD-10-CM

## 2020-01-01 ENCOUNTER — Telehealth: Payer: Self-pay | Admitting: Neurology

## 2020-01-01 NOTE — Telephone Encounter (Signed)
Patient called in and stated she is ready to schedule her MRI

## 2020-01-08 NOTE — Telephone Encounter (Signed)
Phone rep checked office voicemail's, pt left message stating she would like to be called back by [facility] for the scheduling of her MRI, please call  this voicemail was left @ 3:26p.m.

## 2020-01-10 MED ORDER — ALPRAZOLAM 0.5 MG PO TABS: ORAL_TABLET | ORAL | 0 refills | Status: DC

## 2020-01-10 NOTE — Addendum Note (Signed)
Addended by: Arther Abbott on: 01/10/2020 11:42 AM   Modules accepted: Orders

## 2020-01-10 NOTE — Telephone Encounter (Signed)
Spoke to the patient she is scheduled at [facility] for 01/16/20. She informed me she is claustrophobic and would like something to help her. She is aware to have a driver.

## 2020-01-10 NOTE — Telephone Encounter (Signed)
Called pt back. Advised Dr. Epimenio Foot called in xanax. Went over directions with her. She will have driver to and from test.

## 2020-01-10 NOTE — Addendum Note (Signed)
Addended by: Asa Lente on: 01/10/2020 01:57 PM   Modules accepted: Orders

## 2020-01-16 ENCOUNTER — Other Ambulatory Visit: Payer: BC Managed Care – PPO

## 2020-01-16 ENCOUNTER — Other Ambulatory Visit: Payer: Self-pay | Admitting: Neurology

## 2020-01-23 ENCOUNTER — Other Ambulatory Visit: Payer: Self-pay

## 2020-01-23 ENCOUNTER — Ambulatory Visit: Payer: BC Managed Care – PPO

## 2020-01-23 DIAGNOSIS — R260 Ataxic gait: Secondary | ICD-10-CM | POA: Diagnosis not present

## 2020-01-23 DIAGNOSIS — R413 Other amnesia: Secondary | ICD-10-CM

## 2020-01-31 ENCOUNTER — Telehealth: Payer: Self-pay | Admitting: Neurology

## 2020-01-31 NOTE — Telephone Encounter (Signed)
Dr. Sater- please advise 

## 2020-01-31 NOTE — Telephone Encounter (Signed)
Pt called stating she is having issues with her trazadone states she has a hangover  Feeling and it is not working for her. Pt states she doesn't sleep until about 3-4 am

## 2020-01-31 NOTE — Telephone Encounter (Signed)
We can D/C the trazodone and have her try mirtazapine 15 mg nightly.    She has tried many different medications to try to help insomnia.  If none of these medications are helping her enough then we can refer to psychiatry for cognitive behavioral therapy regarding insomnia.

## 2020-02-01 MED ORDER — MIRTAZAPINE 15 MG PO TABS: 15.0000 mg | ORAL_TABLET | Freq: Every day | ORAL | 5 refills | Status: DC

## 2020-02-01 NOTE — Telephone Encounter (Signed)
Called and spoke with pt. Relayed Dr. Bonnita Hollow recommendation. She is agreeable to this plan.  escribed rx mirtazepine to [id number] - [place], [place] - [address]. Advised her to d/c trazodone and not to take it with mirtazapine.

## 2020-03-06 ENCOUNTER — Ambulatory Visit: Payer: BC Managed Care – PPO | Admitting: Neurology

## 2020-03-06 ENCOUNTER — Other Ambulatory Visit: Payer: Self-pay

## 2020-03-06 ENCOUNTER — Encounter: Payer: Self-pay | Admitting: Neurology

## 2020-03-06 VITALS — BP 138/71 | HR 69 | Temp 96.9°F | Wt 221.5 lb

## 2020-03-06 DIAGNOSIS — G4733 Obstructive sleep apnea (adult) (pediatric): Secondary | ICD-10-CM | POA: Diagnosis not present

## 2020-03-06 DIAGNOSIS — E039 Hypothyroidism, unspecified: Secondary | ICD-10-CM

## 2020-03-06 DIAGNOSIS — G4730 Sleep apnea, unspecified: Secondary | ICD-10-CM

## 2020-03-06 DIAGNOSIS — H0520 Unspecified exophthalmos: Secondary | ICD-10-CM

## 2020-03-06 DIAGNOSIS — M79601 Pain in right arm: Secondary | ICD-10-CM | POA: Diagnosis not present

## 2020-03-06 DIAGNOSIS — G4700 Insomnia, unspecified: Secondary | ICD-10-CM | POA: Diagnosis not present

## 2020-03-06 DIAGNOSIS — M542 Cervicalgia: Secondary | ICD-10-CM

## 2020-03-06 DIAGNOSIS — M79602 Pain in left arm: Secondary | ICD-10-CM

## 2020-03-06 MED ORDER — METHYLPREDNISOLONE 4 MG PO TABS: ORAL_TABLET | ORAL | 0 refills | Status: DC

## 2020-03-06 NOTE — Progress Notes (Signed)
[facility]  PATIENT: Melinda Krause DOB: 1961/07/30  REFERRING DOCTOR OR PCP:  Sandford Craze SOURCE: patient and EMR  _________________________________   HISTORICAL  CHIEF COMPLAINT:  Chief Complaint  Patient presents with  . Follow-up    RM 12, alone. Last seen 10/11/2019. Here to f/u on neuropathy, memory issues, insomnia. She feels mirtazepine did not help and she gained 15 lb in one month while on it. She stopped this. For the last 3-4 days she has been having pain/burning sensation in her arms, bilaterally. OTC meds ineffective. buprenorphine does not help with this pain. She has history of neuropathy but this is new for her.     HISTORY OF PRESENT ILLNESS:  [name] is a 59 y.o. woman with obstructive sleep apnea and pain in her hands, legs and feet  Update 03/06/2020: She is having pain in both arms over last 4 days.   It is mild when she gets up and builds up with an achy quality with superimposed waves of pain.   She notes a stiff neck but has had this a while.   She has no recent cervical spine imaging.     She has a h/o disc protrusion in the past associated with right arm pain.   She is on Belbuca 600 mg bid.      No change in bladder function over past week.      She has insomnia.  Remeron caused weight gain.   She has OSA but has not used her CPAP in a long time.     Update 10/11/2019: She is noting more trouble with her memory.    She notes word finding issues and math mistakes.     She notes the memory problems the most but hr husband and sister have also noticed problems.    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    She has noted some issues with navigation while driving (which street to turn on).   She is not driving much.   She denies problems with her mood.     She sleeps ok with her weight loss.     She wakes up a few times a night.  She snores less.   Husband sleeps in a differnet room so is  not sure about OSA signs.   She used to be on CPAP when her weight was higher.  She had severe OSA with an AHI equals 56 prior to being placed on CPAP.  She takes Ambien 6.25 mg with some benefit though still has some sleep onset insomnia.    She feels clumsy and drops items a lot.    She feels the neuropathic pain has done well on Belbuca plus lamotrigine.  She has hypothyroidism and is on Synthroid.  Her TSH earlier this year was 0.79.  She has lost 100 pounds over the past year by diet.  She has had abdominal pain and diarrhea.   She is seeing Dr. Lanae Boast in [facility] an has had endoscopy and colonoscopy.    Montreal Cognitive Assessment  10/11/2019 10/11/2019  Visuospatial/ Executive (0/5) 4 4  Naming (0/3) 3 3  Attention: Read list of digits (0/2) 2 2  Attention: Read list of letters (0/1) 1 1  Attention: Serial 7 subtraction starting at 100 (0/3) 3 3  Language: Repeat phrase (0/2) 1 1  Language : Fluency (0/1) 0 0  Abstraction (0/2) 2 2  Delayed Recall (0/5) 4  4  Orientation (0/6) 6 6  Total 26 26  Adjusted Score (based on education) - 26    Update 6/42020 (virtual) She feels her neuropathic pain is better with Belbuca.  She is still on lamotrigine 200 mg po bid but the combination has helped her more than the lamotrigine alone.  Pain goes up to her calves.   THe soles are constantly numb and the numbness is partial to the mid calves.    She notes mild numbness in her hands.  She changed to a different CPAP mask last year and it is helping her more.   She feels she is sleeping better.   She wakes up twice a night to urinate but falls back asleep.  She uses CPAP > 4 hours most nights.    Ambien has helped her insomnia.    She has lost 30 pounds over the last 6 months.   She exercises some and was going to the pool but that was stopped with COVID19.     Mood is doing better.    She is on Effexor XR 300 mg.        Update 07/27/2018: She is still noting numbness with a prickling  sensation and superimposed stings.    Lamotrigine has helped at 150 mg po bid.  She tolerates it well.    Hands are almost pain free now but the feet are only slightly better.       She has OSA.    She had trouble wearing a mask.   Her pain and stuffed up sensation to her nose led to problems.  Many masks have been tried.    In 2009 a PSG showed an AHI equals 56.  She was titrated to CPAP +8 cm during the split-night study.  Depression is doing well.  This was a major problem in the past.  She continues on Effexor and tolerates it well.  She no longer sees psychiatry.   From 06/22/2017: Pain/dysesthesia:   At the last visit lamotrigine was started and she feels about 40% better.  The painful numbness with burning and tingling in the hands and feet began about 4 years ago. It has been slightly progressive but actually more stable since starting lamotrigine.   .   Does not have diabetes mellitus. She was checked for vasculitis and the ANA was negative. CRP was elevated but sedimentation rate was normal.   OSA/insomnia:   She was diagnosed many year ago with OSA (severe with AHI = 52). .   She has CPAP but can't get used to the mask.  FF mask felt  claustophobic and the nasal pillows caused congestion.    Many masks were tried.      She was never able to use it for 4 hours on average a night.    APAP did not help compliance.    She wakes up gasping for air.    She has difficulty falling asleep and staying asleep.   Falling asleep is more difficult due to a lot of thoughts going through her mind.   Insomnia did not improve with benzodiazepine or with Ambien in the past.  Mood:  Mood is doing better the past year.   She had severe depression and did ECT x 6 at [facility].   She was very apathetic and wouldn't leave her home.   She is much better now.   There is also some anxiety which also improved  REVIEW OF SYSTEMS: Constitutional: No fevers, chills,  sweats, or change in appetite.  She has insomnia that is  worse when she wears her OSA mask.. Eyes: No visual changes, double vision, eye pain Ear, nose and throat: No hearing loss, ear pain, nasal congestion, sore throat Cardiovascular: No chest pain, palpitations Respiratory: No shortness of breath at rest or with exertion.   No wheezes.   OSA on CPAP intermittently GastrointestinaI: No nausea, vomiting, diarrhea, abdominal pain, fecal incontinence Genitourinary: No dysuria, urinary retention or frequency.  No nocturia. Musculoskeletal: Notes some neck pain, back pain Integumentary: No rash, pruritus, skin lesions Neurological: as above Psychiatric: Notes depression and anxiety at this time.  Endocrine: No palpitations, diaphoresis, change in appetite, change in weigh or increased thirst Hematologic/Lymphatic: No anemia, purpura, petechiae. Allergic/Immunologic: No itchy/runny eyes, nasal congestion, recent allergic reactions, rashes  ALLERGIES: Allergies  Allergen Reactions  . Demerol     Irritability, and shortness of breath    HOME MEDICATIONS:  Current Outpatient Medications:  .  acetaminophen (TYLENOL) 500 MG tablet, Take 1,000 mg by mouth daily as needed for mild pain. , Disp: , Rfl:  .  albuterol (PROVENTIL HFA;VENTOLIN HFA) 108 (90 Base) MCG/ACT inhaler, Inhale 2 puffs into the lungs every 6 (six) hours as needed for wheezing or shortness of breath. , Disp: , Rfl:  .  Alpha-Lipoic Acid 600 MG CAPS, Take 600 mg by mouth daily. , Disp: , Rfl:  .  Buprenorphine HCl (BELBUCA) 150 MCG FILM, Place 600 mcg inside cheek 2 (two) times daily as needed. 450 mg, Disp: , Rfl:  .  carvedilol (COREG) 12.5 MG tablet, Take 1 tablet (12.5 mg total) by mouth 2 (two) times daily. OFFICE VISIT NEEDED 2nd attempt, Disp: 60 tablet, Rfl: 0 .  Cholecalciferol (VITAMIN D3) 5000 units CAPS, Take 5,000 Units by mouth daily. , Disp: , Rfl:  .  cyclobenzaprine (FLEXERIL) 5 MG tablet, TAKE 1 TABLET BY MOUTH 3 TIMES DAILY AS NEEDED FOR MUSCLE SPASM. (Patient  taking differently: Take 5 mg by mouth 3 (three) times daily as needed for muscle spasms. ), Disp: 60 tablet, Rfl: 0 .  Diosmin POWD, Take 1,000 mg by mouth daily., Disp: , Rfl:  .  Diphenoxylate-Atropine (LOMOTIL PO), Take by mouth., Disp: , Rfl:  .  furosemide (LASIX) 40 MG tablet, Take 80 mg by mouth daily as needed for fluid or edema., Disp: , Rfl:  .  lamoTRIgine (LAMICTAL) 200 MG tablet, TAKE 1 TABLET BY MOUTH TWICE A DAY, Disp: 60 tablet, Rfl: 3 .  levothyroxine (SYNTHROID, LEVOTHROID) 175 MCG tablet, Take 175 mcg by mouth daily before breakfast., Disp: , Rfl:  .  loratadine (CLARITIN) 10 MG tablet, Take 10 mg by mouth daily as needed for allergies., Disp: , Rfl:  .  losartan (COZAAR) 100 MG tablet, Take 1 tablet (100 mg total) by mouth daily., Disp: 30 tablet, Rfl: 11 .  magnesium oxide (MAG-OX) 400 MG tablet, Take 400 mg by mouth daily., Disp: , Rfl:  .  Melatonin 5 MG TABS, Take 5 mg by mouth at bedtime. , Disp: , Rfl:  .  Multiple Vitamins-Minerals (MULTIVITAMIN PO), Take 1 tablet by mouth daily., Disp: , Rfl:  .  omeprazole (PRILOSEC) 40 MG capsule, TAKE 1 CAPSULE (40 MG TOTAL) BY MOUTH DAILY. (Patient taking differently: Take 40 mg by mouth daily. ), Disp: 30 capsule, Rfl: 0 .  ondansetron (ZOFRAN) 4 MG tablet, Take by mouth., Disp: , Rfl:  .  potassium chloride (K-DUR,KLOR-CON) 10 MEQ tablet, Take 10 mEq by mouth 3 (three)  times daily as needed (for fluid and edema). , Disp: , Rfl:  .  rosuvastatin (CRESTOR) 5 MG tablet, Take 5 mg by mouth daily., Disp: , Rfl:  .  TURMERIC PO, Take 1 tablet by mouth daily., Disp: , Rfl:  .  venlafaxine XR (EFFEXOR-XR) 150 MG 24 hr capsule, Take 300 mg by mouth daily with breakfast., Disp: , Rfl:  .  methylPREDNISolone (MEDROL) 4 MG tablet, Taper from 6 pills po for one day to 1 pill po the last day over 6 days, Disp: 21 tablet, Rfl: 0  PAST MEDICAL HISTORY: Past Medical History:  Diagnosis Date  . 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 Depression    [facility]  . Fibromyalgia    Rheumatologist- Dr. Ardis HughsShali Devashwar -- new Dx of Adiposo Dolorosa.  Marland Kitchen. GERD (gastroesophageal reflux disease)   . Headache(784.0)    daily  . History of chicken pox    childhood  . Hyperlipidemia   . Hypertension   . Hypothyroidism   . Lymphedema of lower extremity    with associated Lipidema.  . Migraine    1-2 per month  . Morbid obesity with BMI of 45.0-49.9, adult (HCC)   . Neck injury    C-5-6  . Non compliance with medical treatment 04/02/2014  . OSA on CPAP    However, she does not routinely use her CPAP every day.  . Pain in joint, shoulder region 04/02/2013   left  . Peptic ulcer   . Peripheral neuropathy   . Seasonal allergies     PAST SURGICAL HISTORY: Past Surgical History:  Procedure Laterality Date  . ABDOMINAL HYSTERECTOMY  2003  . CESAREAN SECTION  1989  . COLONOSCOPY    . NM MYOVIEW LTD  04/18/2014   LexiScan: EF 60%; no evidence of ischemia or infarction. Normal stress test to  . RECTOCELE REPAIR  2009  . removal of vaginal mesh  11/2012  . TRANSTHORACIC ECHOCARDIOGRAM  04/18/2014   EF 55-60%. Difficult to assess for regional wall motion due to poor quality.  Normal diastolic pressures/function. No significant valvular lesions.  . TRANSTHORACIC ECHOCARDIOGRAM  06/2017   Normal left ventricular size and systolic function with no RWMA.  Ef 55-60%. Mild MR.  Marland Kitchen. UPPER GASTROINTESTINAL ENDOSCOPY    . VAGINA RECONSTRUCTION SURGERY  2011   vaginal mesh repair  . VAGINAL HYSTERECTOMY  2003    FAMILY HISTORY: Family History  Problem Relation Age of Onset  . Emphysema Mother   . Allergies Mother   . Asthma Mother   . Hypertension Mother   . Heart failure Mother   . Breast cancer Mother [id number]  . CAD Mother   . Stroke Mother [id number]  . Thyroid disease Mother   . Hypertension Father   . Arthritis Father   . Congestive Heart Failure Father        Died from  complications at age 58  . Congenital heart disease Father   . CAD Father   . Gout Father   . Hypothyroidism Father   . Rheum arthritis Maternal Grandfather   . CAD Maternal Grandfather   . Heart attack Maternal Grandfather [id number]       Sudden cardiac death  . Breast cancer Maternal Grandmother [id number]  . Hypertension Paternal Grandmother   . Heart disease Paternal Grandmother   . Stroke Paternal Grandmother   . Heart attack Paternal Grandfather        Sudden cardiac death  .  Hypertension Son   . Arrhythmia Son   . Arthritis Sister   . Hypothyroidism Sister   . Hypertension Sister   . Coronary artery disease Brother   . Hypothyroidism Brother   . Heart attack Brother 66       Died from heart attack, sudden cardiac death  . Hypothyroidism Sister   . Hypothyroidism Brother   . Hypertension Brother     SOCIAL HISTORY:  Social History   Socioeconomic History  . Marital status: Married    Spouse name: [name]  . Number of children: 3  . Years of education: Bachelor's  . Highest education level: Not on file  Occupational History  . Occupation: Charity fundraiser    Comment: has not worked in 2 years  Tobacco Use  . Smoking status: Never Smoker  . Smokeless tobacco: Never Used  Substance and Sexual Activity  . Alcohol use: No  . Drug use: No  . Sexual activity: Yes    Birth control/protection: None  Other Topics Concern  . Not on file  Social History Narrative   [name]. Three children.   Patient has a Acupuncturist.  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  (Is concerned about lower salary range of benefits)..   Patient is right-handed.   Patient drinks 2-3 Diet sodas daily.   Does not Drink EtOH or smoke      Both her mother and father have died within the past year or so.   Social Determinants of Health   Financial Resource Strain:   . Difficulty of Paying Living Expenses:   Food  Insecurity:   . Worried About Programme researcher, broadcasting/film/video in the Last Year:   . Barista in the Last Year:   Transportation Needs:   . Freight forwarder (Medical):   Marland Kitchen Lack of Transportation (Non-Medical):   Physical Activity:   . Days of Exercise per Week:   . Minutes of Exercise per Session:   Stress:   . Feeling of Stress :   Social Connections:   . Frequency of Communication with Friends and Family:   . Frequency of Social Gatherings with Friends and Family:   . Attends Religious Services:   . Active Member of Clubs or Organizations:   . Attends Banker Meetings:   Marland Kitchen Marital Status:   Intimate Partner Violence:   . Fear of Current or Ex-Partner:   . Emotionally Abused:   Marland Kitchen Physically Abused:   . Sexually Abused:      PHYSICAL EXAM  Vitals:   03/06/20 1300  BP: 138/71  Pulse: 69  Temp: (!) 96.9 F (36.1 C)  Weight: 221 lb 8 oz (100.5 kg)    Body mass index is 38.02 kg/m.   General: The patient is well-developed and well-nourished and in no acute distress.  Sclera are anicteric.  She has exophthalmos and lid lag.   Skin: Extremities are without rash or edema.  Musculoskeletal:  She has mild tenderness over the muscles of the upper chest and upper arm and some of the classic FMS tender points.  Neurologic Exam  Mental status: The patient is alert and oriented x 3 at the time of the examination.   Speech is normal.  Cranial nerves: Extraocular movements are full. Has lid lag.  Facial strength and sensation is normal. Trapezius strength is normal..  The tongue is midline, and the  patient has symmetric elevation of the soft palate. No obvious hearing deficits are noted.  Motor:  Muscle bulk is normal.   Tone is normal.  Strength was 5/5 in the arms and legs including the intrinsic foot muscles.  Sensory: Sensory testing is intact to touch and vibration sensation in the arms and proximally in the legs.  She has decreased vibration and touch  sensation in the distal foot.    Coordination: Cerebellar testing shows good finger-nose-finger and heel-to-shin.  Gait and station: Station is normal.   Gait is normal. Tandem gait is slightly wide  Reflexes: Deep tendon reflexes are symmetric and normal bilateral.     Pain in both upper extremities  Exophthalmos - Plan: Thyroid Panel With TSH  OSA (obstructive sleep apnea)  Insomnia w/ sleep apnea  Hypothyroidism, unspecified type  Neck pain  1.   Prednisone pack for arm pain -- if not better she wil call back and we will need to image with MRI cervical spine.   2.  Check TSH and thyroid panel for exophthalmos 3.   Stay active, exercise as possible. 4.   Return in 6 months or sooner if there are new or worsening neurologic symptoms. Tredarius Cobern A. Epimenio Foot, MD, PhD 03/06/2020, 1:43 PM Certified in Neurology, Clinical Neurophysiology, Sleep Medicine, Pain Medicine and Neuroimaging  [facility] [address] [place], [place] [address] [phone]

## 2020-03-07 ENCOUNTER — Telehealth: Payer: Self-pay | Admitting: *Deleted

## 2020-03-07 LAB — THYROID PANEL WITH TSH
Free Thyroxine Index: 2.9 (ref 1.2–4.9)
T3 Uptake Ratio: 30 % (ref 24–39)
T4, Total: 9.6 ug/dL (ref 4.5–12.0)
TSH: 0.252 u[IU]/mL — ABNORMAL LOW (ref 0.450–4.500)

## 2020-03-07 NOTE — Telephone Encounter (Signed)
-----   Message from Asa Lente, MD sent at 03/07/2020  7:57 AM EDT ----- The TSH was low so she could have mild hyperthyroid --- please let her know and see if we can fax results to her PCP

## 2020-04-09 ENCOUNTER — Other Ambulatory Visit: Payer: Self-pay | Admitting: *Deleted

## 2020-04-09 DIAGNOSIS — M5412 Radiculopathy, cervical region: Secondary | ICD-10-CM

## 2020-04-16 ENCOUNTER — Telehealth: Payer: Self-pay | Admitting: Neurology

## 2020-04-16 NOTE — Telephone Encounter (Signed)
[employer] Auth: [id number] (exp. 04/16/20 to 05/15/20) order faxed to [facility] they will reach out to the patient to schedule. Ph # [phone] & Fax # [phone].

## 2020-04-18 ENCOUNTER — Other Ambulatory Visit: Payer: Self-pay | Admitting: Neurology

## 2020-04-18 MED ORDER — ALPRAZOLAM 0.5 MG PO TABS: ORAL_TABLET | ORAL | 0 refills | Status: DC

## 2020-05-02 ENCOUNTER — Telehealth: Payer: Self-pay | Admitting: *Deleted

## 2020-05-02 ENCOUNTER — Other Ambulatory Visit: Payer: Self-pay | Admitting: *Deleted

## 2020-05-02 DIAGNOSIS — M4802 Spinal stenosis, cervical region: Secondary | ICD-10-CM

## 2020-05-02 NOTE — Telephone Encounter (Signed)
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  This is likely causing the  symptoms in the arms.  If not doing any better, I would like to refer to a neurosurgeon.

## 2020-05-17 ENCOUNTER — Other Ambulatory Visit: Payer: Self-pay | Admitting: Neurology

## 2020-08-10 ENCOUNTER — Other Ambulatory Visit: Payer: Self-pay | Admitting: Neurology

## 2020-08-29 ENCOUNTER — Institutional Professional Consult (permissible substitution): Payer: Self-pay | Admitting: Neurology

## 2020-09-06 ENCOUNTER — Other Ambulatory Visit: Payer: Self-pay | Admitting: Neurology

## 2020-09-10 ENCOUNTER — Other Ambulatory Visit: Payer: Self-pay | Admitting: Neurology

## 2020-12-08 ENCOUNTER — Emergency Department (HOSPITAL_COMMUNITY)
Admission: EM | Admit: 2020-12-08 | Discharge: 2020-12-08 | Disposition: A | Discharge status: Home / Self Care | Payer: BC Managed Care – PPO | Attending: Emergency Medicine | Admitting: Emergency Medicine

## 2020-12-08 ENCOUNTER — Encounter (HOSPITAL_COMMUNITY): Payer: Self-pay | Admitting: Emergency Medicine

## 2020-12-08 ENCOUNTER — Other Ambulatory Visit: Payer: Self-pay

## 2020-12-08 DIAGNOSIS — R059 Cough, unspecified: Secondary | ICD-10-CM | POA: Diagnosis not present

## 2020-12-08 DIAGNOSIS — T50905A Adverse effect of unspecified drugs, medicaments and biological substances, initial encounter: Secondary | ICD-10-CM | POA: Diagnosis not present

## 2020-12-08 DIAGNOSIS — R197 Diarrhea, unspecified: Secondary | ICD-10-CM | POA: Diagnosis present

## 2020-12-08 DIAGNOSIS — Z5321 Procedure and treatment not carried out due to patient leaving prior to being seen by health care provider: Secondary | ICD-10-CM | POA: Diagnosis not present

## 2020-12-08 NOTE — ED Triage Notes (Signed)
Pt reports involuntary muscle jerking that started last night and diarrhea.  Pt believes it may be related to Tramadol and cough medication that she started on Wednesday.

## 2020-12-08 NOTE — ED Notes (Signed)
Pt decided that she was going to leave and go home.

## 2020-12-29 ENCOUNTER — Other Ambulatory Visit: Payer: Self-pay | Admitting: Neurology

## 2021-03-30 ENCOUNTER — Other Ambulatory Visit: Payer: Self-pay | Admitting: Neurology

## 2021-04-01 ENCOUNTER — Telehealth: Payer: Self-pay | Admitting: Neurology

## 2021-04-01 MED ORDER — LAMOTRIGINE 200 MG PO TABS: 200.0000 mg | ORAL_TABLET | Freq: Two times a day (BID) | ORAL | 0 refills | Status: DC

## 2021-04-01 NOTE — Telephone Encounter (Signed)
E-scribed refill 

## 2021-04-01 NOTE — Telephone Encounter (Signed)
Pt request refill lamoTRIgine (LAMICTAL) 200 MG tablet at [id number]. Pt scheduled appt 07/17/21

## 2021-06-04 NOTE — Telephone Encounter (Signed)
Called pt. Offered appt tomorrow at 930am w/ Dr. Epimenio Foot. Pt declined. Has to bring mother to appt. She will call each week to check on cx. She will keep current appt for now.

## 2021-06-29 ENCOUNTER — Other Ambulatory Visit: Payer: Self-pay | Admitting: Neurology

## 2021-07-17 ENCOUNTER — Ambulatory Visit: Payer: BC Managed Care – PPO | Admitting: Neurology

## 2021-07-21 ENCOUNTER — Encounter: Payer: Self-pay | Admitting: Neurology

## 2021-07-21 ENCOUNTER — Ambulatory Visit: Payer: BC Managed Care – PPO | Admitting: Neurology

## 2021-07-21 VITALS — BP 122/65 | HR 70 | Ht 65.0 in | Wt 230.0 lb

## 2021-07-21 DIAGNOSIS — R413 Other amnesia: Secondary | ICD-10-CM

## 2021-07-21 DIAGNOSIS — G4733 Obstructive sleep apnea (adult) (pediatric): Secondary | ICD-10-CM | POA: Diagnosis not present

## 2021-07-21 DIAGNOSIS — G629 Polyneuropathy, unspecified: Secondary | ICD-10-CM

## 2021-07-21 DIAGNOSIS — H0520 Unspecified exophthalmos: Secondary | ICD-10-CM

## 2021-07-21 DIAGNOSIS — E039 Hypothyroidism, unspecified: Secondary | ICD-10-CM

## 2021-07-21 MED ORDER — GABAPENTIN 300 MG PO CAPS: ORAL_CAPSULE | ORAL | 5 refills | Status: AC

## 2021-07-21 NOTE — Progress Notes (Signed)
[facility]  PATIENT: Melinda Krause DOB: [birth year]  REFERRING DOCTOR OR PCP:  Debbrah Alar SOURCE: patient and EMR  _________________________________   HISTORICAL  CHIEF COMPLAINT:  Chief Complaint  Patient presents with   Follow-up    Rm 2, alone. C/o increased pain and numbness in legs and feet.    HISTORY OF PRESENT ILLNESS:  Melinda Krause is a 60 y.o. woman with obstructive sleep apnea and pain in her hands, legs and feet  Update 07/21/2020: She reports more pain and numbness in her feet.    She does no feel the medications are helping her symptoms much.   Pain is bilateral.   In her legs she feels pain up towards the knee.    She was on Belbuca 600 mg bid for pain management but feels it is not helping her much.    No change in bladder function over past week.    She has not had an EMG/NCV.   Gait has been ataxic  Gabapentin had caused headaches in past but she would like to retry this.   She is currently on lamotrigine    She had OSA but has not used her CPAP in a long time.  SInce the last test, she has lost a lot of weight.   She had a 100 pound weight loss in 2020 when sick and has regained 20 pounds back.     She still has some memory and other cognitivie issues that are mostly stable.   She notes reduced focus, memory, math, word finding.     She has hypothyroidism and is on synthroid.   Labs have been fine (TSH June 2022 was 1.16 normal)   Montreal Cognitive Assessment  10/11/2019 10/11/2019  Visuospatial/ Executive (0/5) 4 4  Naming (0/3) 3 3  Attention: Read list of digits (0/2) 2 2  Attention: Read list of letters (0/1) 1 1  Attention: Serial 7 subtraction starting at 100 (0/3) 3 3  Language: Repeat phrase (0/2) 1 1  Language : Fluency (0/1) 0 0  Abstraction (0/2) 2 2  Delayed Recall (0/5) 4 4  Orientation (0/6) 6 6  Total 26 26  Adjusted Score (based on education) - 26    REVIEW OF SYSTEMS: Constitutional: No  fevers, chills, sweats, or change in appetite.  She has insomnia that is worse when she wears her OSA mask.. Eyes: No visual changes, double vision, eye pain Ear, nose and throat: No hearing loss, ear pain, nasal congestion, sore throat Cardiovascular: No chest pain, palpitations Respiratory:  No shortness of breath at rest or with exertion.   No wheezes.   OSA on CPAP intermittently GastrointestinaI: No nausea, vomiting, diarrhea, abdominal pain, fecal incontinence Genitourinary:  No dysuria, urinary retention or frequency.  No nocturia. Musculoskeletal:  Notes some neck pain, back pain Integumentary: No rash, pruritus, skin lesions Neurological: as above Psychiatric: Notes depression and anxiety at this time.  Endocrine: No palpitations, diaphoresis, change in appetite, change in weigh or increased thirst Hematologic/Lymphatic:  No anemia, purpura, petechiae. Allergic/Immunologic: No itchy/runny eyes, nasal congestion, recent allergic reactions, rashes  ALLERGIES: Allergies  Allergen Reactions   Demerol     Irritability, and shortness of breath    HOME MEDICATIONS:  Current Outpatient Medications:    acetaminophen (TYLENOL) 500 MG tablet, Take 1,000 mg by mouth daily as needed for mild pain. , Disp: , Rfl:    albuterol (PROVENTIL HFA;VENTOLIN HFA) 108 (90 Base) MCG/ACT inhaler, Inhale 2 puffs into the lungs  every 6 (six) hours as needed for wheezing or shortness of breath. , Disp: , Rfl:    Alpha-Lipoic Acid 600 MG CAPS, Take 600 mg by mouth daily. , Disp: , Rfl:    Buprenorphine HCl 150 MCG FILM, Place 600 mcg inside cheek 2 (two) times daily as needed. 450 mg, Disp: , Rfl:    carvedilol (COREG) 12.5 MG tablet, Take 1 tablet (12.5 mg total) by mouth 2 (two) times daily. OFFICE VISIT NEEDED 2nd attempt, Disp: 60 tablet, Rfl: 0   Cholecalciferol (VITAMIN D3) 5000 units CAPS, Take 5,000 Units by mouth daily. , Disp: , Rfl:    cyclobenzaprine (FLEXERIL) 5 MG tablet, TAKE 1 TABLET BY  MOUTH 3 TIMES DAILY AS NEEDED FOR MUSCLE SPASM. (Patient taking differently: Take 5 mg by mouth 3 (three) times daily as needed for muscle spasms.), Disp: 60 tablet, Rfl: 0   Diphenoxylate-Atropine (LOMOTIL PO), Take by mouth., Disp: , Rfl:    furosemide (LASIX) 40 MG tablet, Take 80 mg by mouth daily as needed for fluid or edema., Disp: , Rfl:    gabapentin (NEURONTIN) 300 MG capsule, One po qAM, pne po qPM and 2 po qHS, Disp: 120 capsule, Rfl: 5   lamoTRIgine (LAMICTAL) 200 MG tablet, Take 1 tablet (200 mg total) by mouth 2 (two) times daily. Must keep follow up 07/17/21 for ongoing refills, Disp: 180 tablet, Rfl: 0   levothyroxine (SYNTHROID, LEVOTHROID) 175 MCG tablet, Take 175 mcg by mouth daily before breakfast., Disp: , Rfl:    loratadine (CLARITIN) 10 MG tablet, Take 10 mg by mouth daily as needed for allergies., Disp: , Rfl:    losartan (COZAAR) 100 MG tablet, Take 1 tablet (100 mg total) by mouth daily., Disp: 30 tablet, Rfl: 11   magnesium oxide (MAG-OX) 400 MG tablet, Take 400 mg by mouth daily., Disp: , Rfl:    Melatonin 5 MG TABS, Take 5 mg by mouth at bedtime. , Disp: , Rfl:    Multiple Vitamins-Minerals (MULTIVITAMIN PO), Take 1 tablet by mouth daily., Disp: , Rfl:    omeprazole (PRILOSEC) 40 MG capsule, TAKE 1 CAPSULE (40 MG TOTAL) BY MOUTH DAILY. (Patient taking differently: Take 40 mg by mouth daily.), Disp: 30 capsule, Rfl: 0   potassium chloride (K-DUR,KLOR-CON) 10 MEQ tablet, Take 10 mEq by mouth 3 (three) times daily as needed (for fluid and edema). 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 Ashley Royalty -- new Dx of Adiposo Dolorosa.   GERD (gastroesophageal reflux disease)    Headache(784.0)    daily   History of chicken pox    childhood   Hyperlipidemia    Hypertension    Hypothyroidism    Lymphedema of lower extremity    with associated Lipidema.   Migraine    1-2 per month   Morbid obesity with BMI of 45.0-49.9, adult ([place])    Neck injury    C-5-6   Non compliance with medical treatment 04/02/2014   OSA on  CPAP    However, she does not routinely use her CPAP every day.   Pain in joint, shoulder region 04/02/2013   left   Peptic ulcer    Peripheral neuropathy    Seasonal allergies     PAST SURGICAL HISTORY: Past Surgical History:  Procedure Laterality Date   ABDOMINAL HYSTERECTOMY  2003   CESAREAN SECTION  1989   COLONOSCOPY     NM MYOVIEW LTD  04/18/2014   LexiScan: EF 60%; no evidence of ischemia or infarction. Normal stress test to   RECTOCELE REPAIR  2009   removal of vaginal mesh  11/2012   TRANSTHORACIC ECHOCARDIOGRAM  04/18/2014   EF 55-60%. Difficult to assess for regional wall motion due to poor quality.  Normal diastolic pressures/function. No significant valvular lesions.   TRANSTHORACIC ECHOCARDIOGRAM  06/2017   Normal left ventricular size and systolic function with no RWMA.  Ef 55-60%. Mild MR.   UPPER GASTROINTESTINAL ENDOSCOPY     VAGINA RECONSTRUCTION SURGERY  2011   vaginal mesh repair   VAGINAL HYSTERECTOMY  2003    FAMILY HISTORY: Family History  Problem Relation Age of Onset   Emphysema Mother    Allergies Mother    Asthma Mother    Hypertension Mother    Heart failure Mother    Breast cancer Mother 76   CAD Mother    Stroke Mother 70   Thyroid disease Mother    Hypertension Father    Arthritis Father    Congestive Heart Failure Father        Died from complications at age 34   Congenital heart disease Father    CAD Father    Gout Father     Hypothyroidism Father    Rheum arthritis Maternal Grandfather    CAD Maternal Grandfather    Heart attack Maternal Grandfather 22       Sudden cardiac death   Breast cancer Maternal Grandmother 86   Hypertension Paternal Grandmother    Heart disease Paternal Grandmother    Stroke Paternal Grandmother    Heart attack Paternal Grandfather        Sudden cardiac death   Hypertension Son    Arrhythmia Son    Arthritis Sister    Hypothyroidism Sister    Hypertension Sister    Coronary artery disease Brother    Hypothyroidism Brother    Heart attack Brother 63       Died from heart attack, sudden cardiac death   Hypothyroidism Sister    Hypothyroidism Brother    Hypertension Brother     SOCIAL HISTORY:  Social History   Socioeconomic History   Marital status: Married    Spouse name: Evert Kohl   Number of children: 3   Years of education: Water quality scientist   Highest education level: Not on file  Occupational History   Occupation: Therapist, sports    Comment: has not worked in 2 years  Tobacco Use   Smoking status: Never   Smokeless tobacco: Never  Vaping Use   Vaping Use: Never used  Substance and Sexual Activity   Alcohol use: No   Drug use: No   Sexual activity: Yes    Birth control/protection: None  Other Topics Concern   Not on file  Social History Narrative   Patient is married Evert Kohl) and lives at home with her husband. Three children.   Patient has a Chiropodist.  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  (Is concerned about  lower salary range of benefits)..   Patient is right-handed.   Patient drinks 2-3 Diet sodas daily.   Does not Drink EtOH or smoke      Both her mother and father have died within the past year or so.   Social Determinants of Health   Financial Resource Strain: Not on file  Food Insecurity: Not on file  Transportation Needs: Not on file  Physical Activity: Not on file  Stress: Not on file  Social Connections:  Not on file  Intimate Partner Violence: Not on file     PHYSICAL EXAM  Vitals:   07/21/21 1530  BP: 122/65  Pulse: 70  Weight: 230 lb (104.3 kg)  Height: _0  (1.651 m)    Body mass index is 38.27 kg/m.   General: The patient is well-developed and well-nourished and in no acute distress.  Sclera are anicteric.  She has exophthalmos and lid lag.  Pharynx is Mallampati 1  Skin: Extremities are without rash or edema.  Musculoskeletal:  She has mild tenderness over the muscles of the upper chest and upper arm and some of the classic FMS tender points.  Neurologic Exam  Mental status: The patient is alert and oriented x 3 at the time of the examination.   Speech is normal.  Cranial nerves: Extraocular movements are full. Has lid lag.  Facial strength and sensation is normal. Trapezius strength is normal..  The tongue is midline, and the patient has symmetric elevation of the soft palate. No obvious hearing deficits are noted.  Motor:  Muscle bulk is normal.   Tone is normal.  Strength was 5/5 in the arms and legs including the intrinsic foot muscles.  Sensory: Sensory testing is intact to touch and vibration sensation in the arms and proximally in the legs.  She has decreased vibration and touch sensation in the distal foot.    Coordination: Cerebellar testing shows good finger-nose-finger and heel-to-shin.  Gait and station: Station is normal.   Gait is normal. Tandem gait is slightly wide  Reflexes: Deep tendon reflexes are symmetric and normal bilateral.     Neuropathy - Plan: Rheumatoid factor, Sedimentation rate, Sjogren's syndrome antibods(ssa + ssb), Vitamin B12, Multiple Myeloma Panel (SPEP&IFE w/QIG), NCV with EMG(electromyography), Cryoglobulin  OSA (obstructive sleep apnea)  Hypothyroidism, unspecified type  Exophthalmos  Memory loss   1.   Check labs for autoimmune and other causes of neuropathy and NCV/EMG for neuropathy.  Gabapentin 300 mg every  morning, 300 mg p.o. qPM and 600 mg nightly 2.  Home sleep study for OSA.   She has lost a lot of weight.  May no longer need CPAP 3.   Stay active, exercise as possible. 4.   Return in 6 months or sooner if there are new or worsening neurologic symptoms.  Emmah Bratcher A. Felecia Shelling, MD, PhD 6/65/9935, 7:01 PM Certified in Neurology, Clinical Neurophysiology, Sleep Medicine, Pain Medicine and Neuroimaging  [facility] [address], [place], [place] [address] [phone]

## 2021-07-23 ENCOUNTER — Telehealth: Payer: Self-pay | Admitting: Neurology

## 2021-07-23 NOTE — Telephone Encounter (Signed)
LVM for pt to call me back to schedule sleep study  

## 2021-07-27 LAB — MULTIPLE MYELOMA PANEL, SERUM
Albumin SerPl Elph-Mcnc: 3.9 g/dL (ref 2.9–4.4)
Albumin/Glob SerPl: 1.6 (ref 0.7–1.7)
Alpha 1: 0.3 g/dL (ref 0.0–0.4)
Alpha2 Glob SerPl Elph-Mcnc: 0.8 g/dL (ref 0.4–1.0)
B-Globulin SerPl Elph-Mcnc: 0.9 g/dL (ref 0.7–1.3)
Gamma Glob SerPl Elph-Mcnc: 0.5 g/dL (ref 0.4–1.8)
Globulin, Total: 2.5 g/dL (ref 2.2–3.9)
IgA/Immunoglobulin A, Serum: 56 mg/dL — ABNORMAL LOW (ref 87–352)
IgG (Immunoglobin G), Serum: 698 mg/dL (ref 586–1602)
IgM (Immunoglobulin M), Srm: 23 mg/dL — ABNORMAL LOW (ref 26–217)
Total Protein: 6.4 g/dL (ref 6.0–8.5)

## 2021-07-27 LAB — SJOGREN'S SYNDROME ANTIBODS(SSA + SSB)
ENA SSA (RO) Ab: <0.2 AI (ref 0.0–0.9)
ENA SSB (LA) Ab: <0.2 AI (ref 0.0–0.9)

## 2021-07-27 LAB — VITAMIN B12: Vitamin B-12: 603 pg/mL (ref 232–1245)

## 2021-07-27 LAB — SEDIMENTATION RATE: Sed Rate: 7 mm/hr (ref 0–40)

## 2021-07-27 LAB — RHEUMATOID FACTOR: Rheumatoid fact SerPl-aCnc: 10.4 IU/mL (ref <14.0)

## 2021-07-27 LAB — CRYOGLOBULIN

## 2021-07-29 ENCOUNTER — Telehealth: Payer: Self-pay | Admitting: Neurology

## 2021-07-29 NOTE — Telephone Encounter (Signed)
LVM for pt to call me back to schedule sleep study  

## 2021-08-27 ENCOUNTER — Encounter: Payer: BC Managed Care – PPO | Admitting: Neurology

## 2021-09-08 ENCOUNTER — Ambulatory Visit (INDEPENDENT_AMBULATORY_CARE_PROVIDER_SITE_OTHER): Payer: BC Managed Care – PPO | Admitting: Neurology

## 2021-09-08 DIAGNOSIS — G4733 Obstructive sleep apnea (adult) (pediatric): Secondary | ICD-10-CM | POA: Diagnosis not present

## 2021-09-15 NOTE — Progress Notes (Signed)
   [facility]  HOME SLEEP STUDY  STUDY DATE: 09/08/2021 PATIENT NAME: Melinda Krause DOB: May 04, 1961 MRN: [id number]  ORDERING CLINICIAN: Richard A. Epimenio Foot, MD, PhD REFERRING CLINICIAN: Richard A. Sater, MD. PhD   CLINICAL INFORMATION: 60 year old woman with obstructive sleep apnea, currently not on CPAP  FINDINGS:  Total Record Time: 9 hours 43 minutes Total Sleep Time:  8 hours 36 minutes  Percent REM:   9.6%   Calculated pAHI:  79.9/h    REM pAHI:    83.4/h    NREM pAHI: 79.5/h  Pulse Mean:    69 bpm    Oxygen Sat% Mean: 91%  O2Sat Range (68 to 99%)  O2Sat <88%: 45 minutes    IMPRESSION:  This home sleep study demonstrates severe sleep apnea with an pAHI = 79.9/h   RECOMMENDATION: Recommend AutoPap 5-20 cm H2O with a heated humidifier or in lab titration.  Download in 30 and 90 days. 2.   Follow-up with Dr. Epimenio Foot 30 to 90 days after initiation   INTERPRETING PHYSICIAN:   Richard A. Epimenio Foot, MD, PhD, [facility] Certified in Neurology, Clinical Neurophysiology, Sleep Medicine, Pain Medicine and Neuroimaging  [facility] [address], [address] [place], [place] [address] [phone]

## 2021-09-22 ENCOUNTER — Telehealth: Payer: Self-pay | Admitting: Neurology

## 2021-09-22 ENCOUNTER — Encounter: Payer: Self-pay | Admitting: Neurology

## 2021-09-22 ENCOUNTER — Other Ambulatory Visit: Payer: Self-pay | Admitting: Neurology

## 2021-09-22 DIAGNOSIS — G4733 Obstructive sleep apnea (adult) (pediatric): Secondary | ICD-10-CM

## 2021-09-22 NOTE — Telephone Encounter (Signed)
Called patient to discuss sleep study results. No answer at this time. LVM for the patient to call back.  Will send a mychart message as well to the patient.  "She had severe sleep apnea on the home sleep study.  Therefore, I would like to set up AutoPap 5 to 20 cm with heated humidifier and download in 30/90 days.  She would need to have a follow-up with me in about 2 to 3 months"

## 2021-09-22 NOTE — Telephone Encounter (Signed)
I called pt. I advised pt that Dr. Epimenio Foot reviewed their sleep study results and found that pt has sleep apnea. Dr. Epimenio Foot recommends that pt starts auto CPAP. I reviewed PAP compliance expectations with the pt. Pt is agreeable to starting a CPAP. I advised pt that an order will be sent to a DME, [facility], and [facility] will call the pt within about one week after they file with the pt's insurance. [facility] will show the pt how to use the machine, fit for masks, and troubleshoot the CPAP if needed. A follow up appt was made for insurance purposes with Dr. Epimenio Foot on Feb 20,2023 at 2:30 pm. Pt verbalized understanding to arrive 15 minutes early and bring their CPAP. A letter with all of this information in it will be mailed to the pt as a reminder. I verified with the pt that the address we have on file is correct. Pt verbalized understanding of results. Pt had no questions at this time but was encouraged to call back if questions arise. I have sent the order to [facility]  and have received confirmation that they have received the order.

## 2021-09-27 ENCOUNTER — Other Ambulatory Visit: Payer: Self-pay | Admitting: Neurology

## 2021-11-05 ENCOUNTER — Encounter: Payer: BC Managed Care – PPO | Admitting: Neurology

## 2021-11-05 DIAGNOSIS — Z0289 Encounter for other administrative examinations: Secondary | ICD-10-CM

## 2021-12-28 ENCOUNTER — Other Ambulatory Visit: Payer: Self-pay | Admitting: Neurology

## 2022-01-12 ENCOUNTER — Ambulatory Visit: Payer: BC Managed Care – PPO | Admitting: Neurology

## 2022-04-01 ENCOUNTER — Other Ambulatory Visit: Payer: Self-pay | Admitting: Neurology

## 2022-05-01 ENCOUNTER — Other Ambulatory Visit: Payer: Self-pay | Admitting: Neurology

## 2022-05-06 ENCOUNTER — Other Ambulatory Visit: Payer: Self-pay | Admitting: *Deleted

## 2022-05-06 ENCOUNTER — Encounter: Payer: Self-pay | Admitting: Neurology

## 2022-05-06 MED ORDER — LAMOTRIGINE 200 MG PO TABS: 200.0000 mg | ORAL_TABLET | Freq: Two times a day (BID) | ORAL | 0 refills | Status: DC

## 2022-05-29 ENCOUNTER — Other Ambulatory Visit: Payer: Self-pay | Admitting: Neurology

## 2022-06-01 ENCOUNTER — Other Ambulatory Visit: Payer: Self-pay

## 2022-06-01 MED ORDER — LAMOTRIGINE 200 MG PO TABS
200.0000 mg | ORAL_TABLET | Freq: Two times a day (BID) | ORAL | 0 refills | Status: DC
Start: 2022-06-01 — End: 2022-06-30

## 2022-06-01 MED ORDER — LAMOTRIGINE 200 MG PO TABS: 200.0000 mg | ORAL_TABLET | Freq: Two times a day (BID) | ORAL | 0 refills | Status: DC

## 2022-06-25 ENCOUNTER — Encounter: Payer: Self-pay | Admitting: Neurology

## 2022-06-29 ENCOUNTER — Telehealth: Payer: Self-pay

## 2022-06-29 NOTE — Telephone Encounter (Signed)
Called and LVM. Unsure if pt was set up on a CPAP. If so informed pt to bring in to tomorrows appt for manual DL.   Will reach out to [facility] to f/u.

## 2022-06-29 NOTE — Telephone Encounter (Signed)
Pt returned phone call. Did not get CPAP due to financial reason.

## 2022-06-30 ENCOUNTER — Encounter: Payer: Self-pay | Admitting: Neurology

## 2022-06-30 ENCOUNTER — Ambulatory Visit (INDEPENDENT_AMBULATORY_CARE_PROVIDER_SITE_OTHER): Payer: 59 | Admitting: Neurology

## 2022-06-30 VITALS — BP 163/90 | HR 83 | Ht 65.0 in | Wt 227.5 lb

## 2022-06-30 DIAGNOSIS — G629 Polyneuropathy, unspecified: Secondary | ICD-10-CM

## 2022-06-30 DIAGNOSIS — R413 Other amnesia: Secondary | ICD-10-CM | POA: Diagnosis not present

## 2022-06-30 DIAGNOSIS — G4733 Obstructive sleep apnea (adult) (pediatric): Secondary | ICD-10-CM

## 2022-06-30 NOTE — Progress Notes (Signed)
[facility]  PATIENT: Melinda Krause DOB: Feb 20, 1961  REFERRING DOCTOR OR PCP:  Debbrah Alar SOURCE: patient and EMR  _________________________________   HISTORICAL  CHIEF COMPLAINT:  Chief Complaint  Patient presents with   New Patient (Initial Visit)    Rm 1, alone. Here to f/u for OSA and neuropathy. Pt was not able to start CPAP due to insurance coverage. She now has 2 insurance and would like to restart CPAP. Per pt neuropathy  has worsened. New onset of weakness back of legs.     HISTORY OF PRESENT ILLNESS:  Melinda Krause is a 61 y.o. woman with obstructive sleep apnea and pain in her hands, legs and feet  Update 06/30/2022: She reports more pain and numbness in her feet.  Pain goes up to the proximal ankle/mid lower leg.   She is on gabapentin 600 mg po tid, lamotrigine 200 mg po bid.     She does no feel the medications are helping her symptoms much.   Pain is bilateral and symmetric.      Labs 07/21/2021 showed normal/noncontributary SPEP/IEF, B12, SSA/SSB, RF, ESR, cryoglobulin  She is on buprenorphine and oxycodone  for pain management but feels it is not helping her much.    No change in bladder function over past week.    She did not do the EMG/NCV after last visit due to high copay.   Gait has been ataxic   She has OSA but has not used her CPAP in a long time.  She had trouble using the entire night.  Since the last test, she has lost a lot of weight.   She had a 100 pound weight loss in 2020 when sick .    Home sleep study 09/08/2021 showed severe OSA wit AHI=79.     She still has some memory and other cognitivie issues that are mostly stable.  MoCA was normal last visit.   She notes reduced focus, memory, math, word finding.     She has hypothyroidism and is on synthroid.         10/11/2019   12:41 PM 10/11/2019   10:38 AM  Montreal Cognitive Assessment   Visuospatial/ Executive (0/5) 4 4  Naming (0/3) 3 3  Attention: Read  list of digits (0/2) 2 2  Attention: Read list of letters (0/1) 1 1  Attention: Serial 7 subtraction starting at 100 (0/3) 3 3  Language: Repeat phrase (0/2) 1 1  Language : Fluency (0/1) 0 0  Abstraction (0/2) 2 2  Delayed Recall (0/5) 4 4  Orientation (0/6) 6 6  Total 26 26  Adjusted Score (based on education)  26    REVIEW OF SYSTEMS: Constitutional: No fevers, chills, sweats, or change in appetite.  She has insomnia that is worse when she wears her OSA mask.. Eyes: No visual changes, double vision, eye pain Ear, nose and throat: No hearing loss, ear pain, nasal congestion, sore throat Cardiovascular: No chest pain, palpitations Respiratory:  No shortness of breath at rest or with exertion.   No wheezes.   OSA on CPAP intermittently GastrointestinaI: No nausea, vomiting, diarrhea, abdominal pain, fecal incontinence Genitourinary:  No dysuria, urinary retention or frequency.  No nocturia. Musculoskeletal:  Notes some neck pain, back pain Integumentary: No rash, pruritus, skin lesions Neurological: as above Psychiatric: Notes depression and anxiety at this time.  Endocrine: No palpitations, diaphoresis, change in appetite, change in weigh or increased thirst Hematologic/Lymphatic:  No anemia, purpura, petechiae. Allergic/Immunologic: No itchy/runny  eyes, nasal congestion, recent allergic reactions, rashes  ALLERGIES: Allergies  Allergen Reactions   Demerol     Irritability, and shortness of breath    HOME MEDICATIONS:  Current Outpatient Medications:    acetaminophen (TYLENOL) 500 MG tablet, Take 1,000 mg by mouth daily as needed for mild pain. , Disp: , Rfl:    Alpha-Lipoic Acid 600 MG CAPS, Take 600 mg by mouth daily. , Disp: , Rfl:    buprenorphine (SUBUTEX) 8 MG SUBL SL tablet, Place 16 mg under the tongue daily as needed., Disp: , Rfl:    carvedilol (COREG) 12.5 MG tablet, Take 1 tablet (12.5 mg total) by mouth 2 (two) times daily. OFFICE VISIT NEEDED 2nd attempt, Disp:  60 tablet, Rfl: 0   Cholecalciferol (VITAMIN D3) 5000 units CAPS, Take 5,000 Units by mouth daily. , Disp: , Rfl:    cyclobenzaprine (FLEXERIL) 5 MG tablet, TAKE 1 TABLET BY MOUTH 3 TIMES DAILY AS NEEDED FOR MUSCLE SPASM. (Patient taking differently: Take 5 mg by mouth 3 (three) times daily as needed for muscle spasms.), Disp: 60 tablet, Rfl: 0   furosemide (LASIX) 40 MG tablet, Take 80 mg by mouth daily as needed for fluid or edema., Disp: , Rfl:    gabapentin (NEURONTIN) 300 MG capsule, One po qAM, pne po qPM and 2 po qHS, Disp: 120 capsule, Rfl: 5   lamoTRIgine (LAMICTAL) 200 MG tablet, Take 1 tablet (200 mg total) by mouth 2 (two) times daily. Please call and make overdue appt for further refills. 2nd attempt, Disp: 60 tablet, Rfl: 0   levothyroxine (SYNTHROID, LEVOTHROID) 175 MCG tablet, Take 175 mcg by mouth daily before breakfast., Disp: , Rfl:    losartan (COZAAR) 100 MG tablet, Take 1 tablet (100 mg total) by mouth daily., Disp: 30 tablet, Rfl: 11   magnesium oxide (MAG-OX) 400 MG tablet, Take 400 mg by mouth daily., Disp: , Rfl:    Melatonin 5 MG TABS, Take 5 mg by mouth at bedtime. , Disp: , Rfl:    Multiple Vitamins-Minerals (MULTIVITAMIN PO), Take 1 tablet by mouth daily., Disp: , Rfl:    omeprazole (PRILOSEC) 40 MG capsule, TAKE 1 CAPSULE (40 MG TOTAL) BY MOUTH DAILY. (Patient taking differently: Take 40 mg by mouth daily.), Disp: 30 capsule, Rfl: 0   oxyCODONE-acetaminophen (PERCOCET) 10-325 MG tablet, Take 1 tablet by mouth 3 (three) times daily as needed., Disp: , Rfl:    potassium chloride (K-DUR,KLOR-CON) 10 MEQ tablet, Take 10 mEq by mouth 3 (three) times daily as needed (for fluid and edema). 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 Ashley Royalty -- new Dx of Adiposo Dolorosa.   GERD (gastroesophageal reflux disease)    Headache(784.0)    daily   History of chicken pox    childhood   Hyperlipidemia    Hypertension    Hypothyroidism    Lymphedema of lower extremity    with associated Lipidema.   Migraine    1-2 per month  Morbid obesity with BMI of 45.0-49.9, adult ([place])    Neck injury    C-5-6   Non compliance with medical treatment 04/02/2014   OSA on CPAP    However, she does not routinely use her CPAP every day.   Pain in joint, shoulder region 04/02/2013   left   Peptic ulcer    Peripheral neuropathy    Seasonal allergies     PAST SURGICAL HISTORY: Past Surgical History:  Procedure Laterality Date   ABDOMINAL HYSTERECTOMY  2003   CESAREAN SECTION  1989   COLONOSCOPY     NM MYOVIEW LTD  04/18/2014   LexiScan: EF 60%; no evidence of ischemia or infarction. Normal stress test to   RECTOCELE REPAIR  2009   removal of vaginal mesh  11/2012   TRANSTHORACIC ECHOCARDIOGRAM  04/18/2014   EF 55-60%. Difficult to assess for regional wall motion due to poor quality.  Normal diastolic pressures/function. No significant valvular lesions.   TRANSTHORACIC ECHOCARDIOGRAM  06/2017   Normal left ventricular size and systolic function with no RWMA.  Ef 55-60%. Mild MR.   UPPER GASTROINTESTINAL ENDOSCOPY     VAGINA RECONSTRUCTION SURGERY  2011   vaginal mesh repair   VAGINAL HYSTERECTOMY  2003    FAMILY HISTORY: Family History  Problem Relation Age of Onset   Emphysema Mother    Allergies Mother    Asthma Mother    Hypertension Mother    Heart failure Mother    Breast cancer Mother 37   CAD Mother    Stroke Mother 44   Thyroid disease Mother    Hypertension Father    Arthritis Father     Congestive Heart Failure Father        Died from complications at age 44   Congenital heart disease Father    CAD Father    Gout Father    Hypothyroidism Father    Rheum arthritis Maternal Grandfather    CAD Maternal Grandfather    Heart attack Maternal Grandfather 59       Sudden cardiac death   Breast cancer Maternal Grandmother 67   Hypertension Paternal Grandmother    Heart disease Paternal Grandmother    Stroke Paternal Grandmother    Heart attack Paternal Grandfather        Sudden cardiac death   Hypertension Son    Arrhythmia Son    Arthritis Sister    Hypothyroidism Sister    Hypertension Sister    Coronary artery disease Brother    Hypothyroidism Brother    Heart attack Brother 63       Died from heart attack, sudden cardiac death   Hypothyroidism Sister    Hypothyroidism Brother    Hypertension Brother     SOCIAL HISTORY:  Social History   Socioeconomic History   Marital status: Married    Spouse name: Evert Kohl   Number of children: 3   Years of education: Water quality scientist   Highest education level: Not on file  Occupational History   Occupation: Therapist, sports    Comment: has not worked in 2 years  Tobacco Use   Smoking status: Never   Smokeless tobacco: Never  Vaping Use   Vaping Use: Never used  Substance and Sexual Activity   Alcohol use: No   Drug use: No   Sexual activity: Yes    Birth control/protection: None  Other Topics Concern   Not on file  Social History Narrative   Patient is married Evert Kohl) and lives at home with her  husband. Three children.   Patient has a Chiropodist.  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  (Is concerned about lower salary range of benefits)..   Patient is right-handed.   Patient drinks 2-3 Diet sodas daily.   Does not Drink EtOH or smoke      Both her mother and father have died within the past year or so.   Social Determinants of Health   Financial Resource Strain: Not on file   Food Insecurity: Not on file  Transportation Needs: Not on file  Physical Activity: Not on file  Stress: Not on file  Social Connections: Not on file  Intimate Partner Violence: Not on file     PHYSICAL EXAM  Vitals:   06/30/22 1611  BP: (!) 163/90  Pulse: 83  Weight: 227 lb 8 oz (103.2 kg)  Height: _0  (1.651 m)     Body mass index is 37.86 kg/m.   General: The patient is well-developed and well-nourished and in no acute distress.  Sclera are anicteric.  She has exophthalmos and lid lag.  Pharynx is Mallampati 1  Skin: Extremities are without rash or edema.  Musculoskeletal:  She has mild tenderness over the muscles of the upper chest and upper arm and some of the classic FMS tender points.  Neurologic Exam  Mental status: The patient is alert and oriented x 3 at the time of the examination.   Speech is normal.  Cranial nerves: Extraocular movements are full. Has lid lag.  Facial strength and sensation is normal. Trapezius strength is normal..  The tongue is midline, and the patient has symmetric elevation of the soft palate. No obvious hearing deficits are noted.  Motor:  Muscle bulk is normal.   Tone is normal.  Strength was 5/5 in the arms and legs including the intrinsic foot muscles.  Sensory: Sensory testing is intact to touch and vibration sensation in the arms and proximally in the legs.  She has near normal vibration at ankles, reduced at toes.   Reduced pinprick at ankles, more reduced at toes.      Coordination: Cerebellar testing shows good finger-nose-finger and heel-to-shin.  Gait and station: Station is normal.   Gait is normal. Tandem gait is slightly wide  Reflexes: Deep tendon reflexes are symmetric and normal bilateral.     OSA (obstructive sleep apnea)  Neuropathy  Memory loss   1.    Continue gabapentin. He will taper  lamotrigine off and then consider oxcarbazepine.  She will also continue venlafaxine ER.   She is on opiates from Pain  [facility] (Dr. Merlinda Frederick).   If neuropathic pain symptoms worsen, we could consider an EMG/NCV and skin biopsy if NCV negative to look for small fiber polyneuropathy. 2.   Home sleep study showed severe OSA.   We will order APAP 5-20 cm  3.   Stay active, exercise as possible. 4.   Return in 6 months or sooner if there are new or worsening neurologic symptoms.  Vester Balthazor A. Felecia Shelling, MD, PhD 02/27/8031, 1:22 PM Certified in Neurology, Clinical Neurophysiology, Sleep Medicine, Pain Medicine and Neuroimaging  [facility] [address] [place], [place] [address] [phone]

## 2022-07-01 ENCOUNTER — Other Ambulatory Visit: Payer: Self-pay | Admitting: Neurology

## 2022-07-01 ENCOUNTER — Telehealth: Payer: Self-pay | Admitting: Neurology

## 2022-07-01 DIAGNOSIS — G4733 Obstructive sleep apnea (adult) (pediatric): Secondary | ICD-10-CM

## 2022-07-01 NOTE — Telephone Encounter (Signed)
Called pt, scheduled for initial CPAP 09/03/22 at 3:45 pm with Ihor Austin.

## 2022-07-01 NOTE — Telephone Encounter (Signed)
Per Dr Epimenio Foot, pt would like to try and start auto CPAP. I have printed everything to send to [facility] for the patient. A 31-90 day follow up once getting the machine is needed for insurance purposes. This can be with NP or MD as it is a compliance visit for insurance purposes. I will have a team member contact her to move her January appt up sooner to around 08/31/22-09/23/22 times frame.  "She has severe OSA (HST was 08/2021).    "She now has better insurance and we can start APAP 5-20 cm H2O with heated humidifier and d/l in 31-90 days."

## 2022-07-06 ENCOUNTER — Other Ambulatory Visit: Payer: Self-pay | Admitting: Neurology

## 2022-07-29 ENCOUNTER — Other Ambulatory Visit: Payer: Self-pay | Admitting: Neurology

## 2022-09-03 ENCOUNTER — Ambulatory Visit: Payer: 59 | Admitting: Adult Health

## 2022-12-10 ENCOUNTER — Ambulatory Visit: Payer: BC Managed Care – PPO | Admitting: Neurology

## 2022-12-16 ENCOUNTER — Ambulatory Visit: Payer: 59 | Admitting: Neurology

## 2023-08-03 ENCOUNTER — Other Ambulatory Visit: Payer: Self-pay | Admitting: Neurology

## 2023-08-04 NOTE — Telephone Encounter (Signed)
Last seen on 06/30/22 per note " . Continue gabapentin. He will taper lamotrigine off and then consider oxcarbazepine " No follow up scheduled
